# Patient Record
Sex: Female | Born: 1950 | Smoking: Never smoker
Health system: Southern US, Community
[De-identification: ages and names within clinical notes are randomized; demographics above are authoritative.]

## PROBLEM LIST (undated history)

## (undated) DIAGNOSIS — D649 Anemia, unspecified: Secondary | ICD-10-CM

## (undated) DIAGNOSIS — K219 Gastro-esophageal reflux disease without esophagitis: Secondary | ICD-10-CM

## (undated) DIAGNOSIS — M1990 Unspecified osteoarthritis, unspecified site: Secondary | ICD-10-CM

## (undated) DIAGNOSIS — R011 Cardiac murmur, unspecified: Secondary | ICD-10-CM

## (undated) DIAGNOSIS — H269 Unspecified cataract: Secondary | ICD-10-CM

## (undated) DIAGNOSIS — G4733 Obstructive sleep apnea (adult) (pediatric): Secondary | ICD-10-CM

## (undated) DIAGNOSIS — T7840XA Allergy, unspecified, initial encounter: Secondary | ICD-10-CM

## (undated) DIAGNOSIS — C4492 Squamous cell carcinoma of skin, unspecified: Secondary | ICD-10-CM

## (undated) DIAGNOSIS — F419 Anxiety disorder, unspecified: Secondary | ICD-10-CM

## (undated) DIAGNOSIS — K2970 Gastritis, unspecified, without bleeding: Secondary | ICD-10-CM

## (undated) DIAGNOSIS — G4730 Sleep apnea, unspecified: Secondary | ICD-10-CM

## (undated) DIAGNOSIS — I10 Essential (primary) hypertension: Secondary | ICD-10-CM

## (undated) DIAGNOSIS — E0590 Thyrotoxicosis, unspecified without thyrotoxic crisis or storm: Secondary | ICD-10-CM

## (undated) HISTORY — DX: Anemia, unspecified: D64.9

## (undated) HISTORY — DX: Allergy, unspecified, initial encounter: T78.40XA

## (undated) HISTORY — DX: Thyrotoxicosis, unspecified without thyrotoxic crisis or storm: E05.90

## (undated) HISTORY — DX: Unspecified cataract: H26.9

## (undated) HISTORY — DX: Essential (primary) hypertension: I10

## (undated) HISTORY — PX: ABDOMINAL HYSTERECTOMY: SHX81

## (undated) HISTORY — DX: Anxiety disorder, unspecified: F41.9

## (undated) HISTORY — DX: Unspecified osteoarthritis, unspecified site: M19.90

## (undated) HISTORY — DX: Obstructive sleep apnea (adult) (pediatric): G47.33

## (undated) HISTORY — DX: Gastritis, unspecified, without bleeding: K29.70

## (undated) HISTORY — DX: Cardiac murmur, unspecified: R01.1

## (undated) HISTORY — DX: Squamous cell carcinoma of skin, unspecified: C44.92

## (undated) HISTORY — DX: Sleep apnea, unspecified: G47.30

## (undated) HISTORY — PX: EYE SURGERY: SHX253

## (undated) HISTORY — PX: WRIST ARTHROSCOPY: SUR100

## (undated) HISTORY — PX: ENDOMETRIAL ABLATION: SHX621

## (undated) HISTORY — DX: Gastro-esophageal reflux disease without esophagitis: K21.9

## (undated) HISTORY — PX: HERNIA REPAIR: SHX51

---

## 2020-08-27 ENCOUNTER — Telehealth: Payer: Self-pay | Admitting: Internal Medicine

## 2020-09-09 ENCOUNTER — Encounter: Payer: Self-pay | Admitting: Internal Medicine

## 2020-09-09 ENCOUNTER — Other Ambulatory Visit: Payer: Self-pay

## 2020-09-09 ENCOUNTER — Ambulatory Visit (INDEPENDENT_AMBULATORY_CARE_PROVIDER_SITE_OTHER): Payer: Medicare Other | Admitting: Internal Medicine

## 2020-09-09 VITALS — BP 120/80 | HR 69 | Temp 97.9°F | Ht 65.0 in | Wt 171.2 lb

## 2020-09-09 DIAGNOSIS — K2950 Unspecified chronic gastritis without bleeding: Secondary | ICD-10-CM

## 2020-09-09 DIAGNOSIS — H269 Unspecified cataract: Secondary | ICD-10-CM | POA: Insufficient documentation

## 2020-09-09 DIAGNOSIS — Z1231 Encounter for screening mammogram for malignant neoplasm of breast: Secondary | ICD-10-CM | POA: Diagnosis not present

## 2020-09-09 DIAGNOSIS — K219 Gastro-esophageal reflux disease without esophagitis: Secondary | ICD-10-CM

## 2020-09-09 DIAGNOSIS — M8580 Other specified disorders of bone density and structure, unspecified site: Secondary | ICD-10-CM | POA: Diagnosis not present

## 2020-09-09 DIAGNOSIS — Z1283 Encounter for screening for malignant neoplasm of skin: Secondary | ICD-10-CM

## 2020-09-09 DIAGNOSIS — H259 Unspecified age-related cataract: Secondary | ICD-10-CM

## 2020-09-09 DIAGNOSIS — E0590 Thyrotoxicosis, unspecified without thyrotoxic crisis or storm: Secondary | ICD-10-CM

## 2020-09-09 DIAGNOSIS — I10 Essential (primary) hypertension: Secondary | ICD-10-CM | POA: Diagnosis not present

## 2020-09-09 DIAGNOSIS — K2970 Gastritis, unspecified, without bleeding: Secondary | ICD-10-CM | POA: Insufficient documentation

## 2020-09-09 MED ORDER — METHIMAZOLE 5 MG PO TABS: 5.0000 mg | ORAL_TABLET | Freq: Every day | ORAL | 1 refills | Status: DC

## 2020-09-09 MED ORDER — IRBESARTAN 75 MG PO TABS: 75.0000 mg | ORAL_TABLET | Freq: Every day | ORAL | 1 refills | Status: DC

## 2020-09-09 MED ORDER — PANTOPRAZOLE SODIUM 40 MG PO TBEC: 40.0000 mg | DELAYED_RELEASE_TABLET | Freq: Two times a day (BID) | ORAL | 1 refills | Status: DC

## 2020-09-09 NOTE — Progress Notes (Signed)
New Patient Office Visit     This visit occurred during the SARS-CoV-2 public health emergency.  Safety protocols were in place, including screening questions prior to the visit, additional usage of staff PPE, and extensive cleaning of exam room while observing appropriate contact time as indicated for disinfecting solutions.    CC/Reason for Visit: Establish care, discuss chronic conditions, medication refills Previous PCP: in [place]  Last Visit: Spring 2020  HPI: Emlyn Maves is a 69 y.o. female who is coming in today for the above mentioned reasons. Just moved from [place] to be closer to family. Past Medical History is significant for: hyperthyroidism on methimazole, HTN well controlled on irbesartan, GERD and bilateral cataracts. Cataracts have been getting worse, wants to see ophtho. 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 Used to be a redhead, wants to see derm once a year for screening. Her GERD has been much worse lately. She tells me she has had gastritis. Used to have EGD for "surveillance" ([facility]). No records yet. She has started OTC omeprazole in the am and famotidine in the pm. No meaningful relief. She also has some "liver cysts" and was getting an [place] yearly to monitor. She would like her thyroid levels checked.    Past Medical/Surgical History: Past Medical History:  Diagnosis Date  . Cataracts, bilateral   . Gastritis   . GERD (gastroesophageal reflux disease)   . Hypertension   . Hyperthyroidism     History reviewed. No pertinent surgical history.  Social History:  reports that she has never smoked. She has never used smokeless tobacco. She reports current alcohol use. She reports that she does not use drugs.  Allergies: Allergies  Allergen Reactions  . Erythromycin   . Macrobid [Nitrofurantoin]   . Penicillins   . Sulfa Antibiotics     Family History:  No CAD, CVA, cancer that she is aware of.  Current Outpatient Medications:  .  irbesartan  (AVAPRO) 75 MG tablet, Take 75 mg by mouth daily., Disp: , Rfl:  .  methimazole (TAPAZOLE) 5 MG tablet, Take 5 mg by mouth daily., Disp: , Rfl:  .  pantoprazole (PROTONIX) 40 MG tablet, Take 1 tablet (40 mg total) by mouth 2 (two) times daily., Disp: 180 tablet, Rfl: 1 .  RESTASIS 0.05 % ophthalmic emulsion, 1 drop 2 (two) times daily., Disp: , Rfl:   Review of Systems:  Constitutional: Denies fever, chills, diaphoresis, appetite change and fatigue.  HEENT: Denies photophobia, eye pain, redness, hearing loss, ear pain, congestion, sore throat, rhinorrhea, sneezing, mouth sores, trouble swallowing, neck pain, neck stiffness and tinnitus.   Respiratory: Denies SOB, DOE, cough, chest tightness,  and wheezing.   Cardiovascular: Denies chest pain, palpitations and leg swelling.  Gastrointestinal: Denies nausea, vomiting,  diarrhea, constipation, blood in stool and abdominal distention.  Genitourinary: Denies dysuria, urgency, frequency, hematuria, flank pain and difficulty urinating.  Endocrine: Denies: hot or cold intolerance, sweats, changes in hair or nails, polyuria, polydipsia. Musculoskeletal: Denies myalgias, back pain, joint swelling, arthralgias and gait problem.  Skin: Denies pallor, rash and wound.  Neurological: Denies dizziness, seizures, syncope, weakness, light-headedness, numbness and headaches.  Hematological: Denies adenopathy. Easy bruising, personal or family bleeding history  Psychiatric/Behavioral: Denies suicidal ideation, mood changes, confusion, nervousness, sleep disturbance and agitation    Physical Exam: Vitals:   09/09/20 1545  BP: 120/80  Pulse: 69  Temp: 97.9 F (36.6 C)  TempSrc: Oral  SpO2: 97%  Weight: 171 lb 3.2 oz (77.7 kg)  Height: 5\' 5"  (1.651 m)   Body mass index is 28.49 kg/m.  Constitutional: NAD, calm, comfortable Eyes: PERRL, lids and conjunctivae normal, wears corrective lenses. ENMT: Mucous membranes are moist Respiratory: clear to  auscultation bilaterally, no wheezing, no crackles. Normal respiratory effort. No accessory muscle use.  Cardiovascular: Regular rate and rhythm, systolic murmurs, no rubs / gallops. No extremity edema. 2+ pedal pulses. No carotid bruits.  Neurologic: grossly intact and non-focal  Psychiatric: Normal judgment and insight. Alert and oriented x 3. Normal mood.    Impression and Plan:  Encounter for screening mammogram for malignant neoplasm of breast  - Plan: MM Digital Screening  Osteopenia, unspecified location  - Plan: DG Bone Density  Hyperthyroidism  - Plan: T3, free, T4, free, TSH -Refill methimazole 5 mg daily. -Refer to endocrinology.  Primary hypertension -Well controlled on irbesartan 75 mg daily. Refill today.  Gastroesophageal reflux disease without esophagitis Chronic gastritis without bleeding, unspecified gastritis type  - Plan: pantoprazole (PROTONIX) 40 MG tablet BID -Refer to GI, looks like may need EGD. Obtain records to see why she was getting surveillance EGDs.  Age-related cataract of both eyes, unspecified age-related cataract type -ophtho referral.    Patient Instructions  -Nice seeing you today!!  -Lab work today; will notify you once results are available.  -Referrals to GI, Endocrinology, Ophthalmology and Dermatology today.  -Start Protonix 40 mg twice daily.  -Keep your January appointment for your physical. Please come in fasting that day.     Lelon Frohlich, MD [facility] at [facility]

## 2020-09-09 NOTE — Patient Instructions (Signed)
-  Nice seeing you today!!  -Lab work today; will notify you once results are available.  -Referrals to GI, Endocrinology, Ophthalmology and Dermatology today.  -Start Protonix 40 mg twice daily.  -Keep your January appointment for your physical. Please come in fasting that day.

## 2020-09-10 LAB — T4, FREE: Free T4: 1.2 ng/dL (ref 0.8–1.8)

## 2020-09-10 LAB — T3, FREE: T3, Free: 3.1 pg/mL (ref 2.3–4.2)

## 2020-09-10 LAB — TSH: TSH: 4.36 mIU/L (ref 0.40–4.50)

## 2020-09-12 ENCOUNTER — Telehealth: Payer: Self-pay | Admitting: Internal Medicine

## 2020-09-12 DIAGNOSIS — I10 Essential (primary) hypertension: Secondary | ICD-10-CM

## 2020-09-12 DIAGNOSIS — E0590 Thyrotoxicosis, unspecified without thyrotoxic crisis or storm: Secondary | ICD-10-CM

## 2020-09-12 DIAGNOSIS — K219 Gastro-esophageal reflux disease without esophagitis: Secondary | ICD-10-CM

## 2020-09-12 DIAGNOSIS — K2950 Unspecified chronic gastritis without bleeding: Secondary | ICD-10-CM

## 2020-09-12 MED ORDER — METHIMAZOLE 5 MG PO TABS: 5.0000 mg | ORAL_TABLET | Freq: Every day | ORAL | 1 refills | Status: DC

## 2020-09-12 MED ORDER — PANTOPRAZOLE SODIUM 40 MG PO TBEC: 40.0000 mg | DELAYED_RELEASE_TABLET | Freq: Two times a day (BID) | ORAL | 1 refills | Status: DC

## 2020-09-12 MED ORDER — IRBESARTAN 75 MG PO TABS: 75.0000 mg | ORAL_TABLET | Freq: Every day | ORAL | 1 refills | Status: DC

## 2020-09-12 NOTE — Telephone Encounter (Signed)
irbesartan (AVAPRO) 75 MG tablet  methimazole (TAPAZOLE) 5 MG tablet  pantoprazole (PROTONIX) 40 MG tablet  [id number] [address] [place], [place] - [address] AT [address] Phone:  [phone]  Fax:  [phone]

## 2020-09-12 NOTE — Addendum Note (Signed)
Addended by: Westley Hummer B on: 09/12/2020 04:46 PM   Modules accepted: Orders

## 2020-09-12 NOTE — Telephone Encounter (Signed)
Refills sent

## 2020-09-30 ENCOUNTER — Other Ambulatory Visit: Payer: Self-pay

## 2020-09-30 ENCOUNTER — Ambulatory Visit (INDEPENDENT_AMBULATORY_CARE_PROVIDER_SITE_OTHER)
Admission: RE | Admit: 2020-09-30 | Discharge: 2020-09-30 | Disposition: A | Discharge status: Home / Self Care | Payer: Medicare Other | Source: Ambulatory Visit | Attending: Internal Medicine | Admitting: Internal Medicine

## 2020-09-30 ENCOUNTER — Encounter: Payer: Self-pay | Admitting: Internal Medicine

## 2020-09-30 DIAGNOSIS — M8580 Other specified disorders of bone density and structure, unspecified site: Secondary | ICD-10-CM | POA: Diagnosis not present

## 2020-09-30 DIAGNOSIS — M810 Age-related osteoporosis without current pathological fracture: Secondary | ICD-10-CM | POA: Insufficient documentation

## 2020-10-03 ENCOUNTER — Other Ambulatory Visit: Payer: Self-pay

## 2020-10-03 ENCOUNTER — Ambulatory Visit (INDEPENDENT_AMBULATORY_CARE_PROVIDER_SITE_OTHER): Payer: Medicare Other | Admitting: Internal Medicine

## 2020-10-03 VITALS — BP 134/82 | HR 75 | Temp 98.5°F | Ht 65.0 in | Wt 173.8 lb

## 2020-10-03 DIAGNOSIS — M810 Age-related osteoporosis without current pathological fracture: Secondary | ICD-10-CM

## 2020-10-03 NOTE — Progress Notes (Signed)
Established Patient Office Visit     This visit occurred during the SARS-CoV-2 public health emergency.  Safety protocols were in place, including screening questions prior to the visit, additional usage of staff PPE, and extensive cleaning of exam room while observing appropriate contact time as indicated for disinfecting solutions.    CC/Reason for Visit: Discuss osteoporosis management  HPI: Bethany Cole is a 69 y.o. female who is coming in today for the above mentioned reasons.  We have asked her to come in today to discuss her DEXA scan results.  It was done on December 7 and showed osteoporosis with a left femoral neck T score of -2.6.  Right femoral neck was -2.2 and lumbar spine was -1.8.  She has had 2 fractures in the past.   Past Medical/Surgical History: Past Medical History:  Diagnosis Date  . Cataracts, bilateral   . Gastritis   . GERD (gastroesophageal reflux disease)   . Hypertension   . Hyperthyroidism     No past surgical history on file.  Social History:  reports that she has never smoked. She has never used smokeless tobacco. She reports current alcohol use. She reports that she does not use drugs.  Allergies: Allergies  Allergen Reactions  . Erythromycin   . Macrobid [Nitrofurantoin]   . Penicillins   . Sulfa Antibiotics     Family History:  No history of heart disease, cancer, stroke that she is aware of  Current Outpatient Medications:  .  irbesartan (AVAPRO) 75 MG tablet, Take 1 tablet (75 mg total) by mouth daily., Disp: 90 tablet, Rfl: 1 .  methimazole (TAPAZOLE) 5 MG tablet, Take 1 tablet (5 mg total) by mouth daily., Disp: 90 tablet, Rfl: 1 .  pantoprazole (PROTONIX) 40 MG tablet, Take 1 tablet (40 mg total) by mouth 2 (two) times daily., Disp: 180 tablet, Rfl: 1 .  RESTASIS 0.05 % ophthalmic emulsion, 1 drop 2 (two) times daily., Disp: , Rfl:   Review of Systems:  Constitutional: Denies fever, chills, diaphoresis, appetite change  and fatigue.  HEENT: Denies photophobia, eye pain, redness, hearing loss, ear pain, congestion, sore throat, rhinorrhea, sneezing, mouth sores, trouble swallowing, neck pain, neck stiffness and tinnitus.   Respiratory: Denies SOB, DOE, cough, chest tightness,  and wheezing.   Cardiovascular: Denies chest pain, palpitations and leg swelling.  Gastrointestinal: Denies nausea, vomiting, abdominal pain, diarrhea, constipation, blood in stool and abdominal distention.  Genitourinary: Denies dysuria, urgency, frequency, hematuria, flank pain and difficulty urinating.  Endocrine: Denies: hot or cold intolerance, sweats, changes in hair or nails, polyuria, polydipsia. Musculoskeletal: Denies myalgias, back pain, joint swelling, arthralgias and gait problem.  Skin: Denies pallor, rash and wound.  Neurological: Denies dizziness, seizures, syncope, weakness, light-headedness, numbness and headaches.  Hematological: Denies adenopathy. Easy bruising, personal or family bleeding history  Psychiatric/Behavioral: Denies suicidal ideation, mood changes, confusion, nervousness, sleep disturbance and agitation    Physical Exam: Vitals:   10/03/20 1407  BP: 134/82  Pulse: 75  Temp: 98.5 F (36.9 C)  TempSrc: Oral  SpO2: 96%  Weight: 173 lb 12.8 oz (78.8 kg)  Height: 5\' 5"  (1.651 m)    Body mass index is 28.92 kg/m.   Constitutional: NAD, calm, comfortable Eyes: PERRL, lids and conjunctivae normal ENMT: Mucous membranes are moist. Neurologic: Grossly intact and nonfocal Psychiatric: Normal judgment and insight. Alert and oriented x 3. Normal mood.    Impression and Plan:  Age-related osteoporosis without current pathological fracture -We have discussed osteoporosis management. -  We have discussed mainly bisphosphonate usage.  Side effect profile has been described in detail.  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  She  has also not had a dental appointment in over a year. -I would like her to secure dental care before we start a bisphosphonate. -She will do this and notify me afterwards to consider bisphosphonate initiation.    Lelon Frohlich, MD [facility]

## 2020-10-20 ENCOUNTER — Encounter: Payer: Self-pay | Admitting: Nurse Practitioner

## 2020-10-31 DIAGNOSIS — H35371 Puckering of macula, right eye: Secondary | ICD-10-CM | POA: Diagnosis not present

## 2020-10-31 DIAGNOSIS — H2513 Age-related nuclear cataract, bilateral: Secondary | ICD-10-CM | POA: Diagnosis not present

## 2020-10-31 DIAGNOSIS — H524 Presbyopia: Secondary | ICD-10-CM | POA: Diagnosis not present

## 2020-10-31 DIAGNOSIS — H5211 Myopia, right eye: Secondary | ICD-10-CM | POA: Diagnosis not present

## 2020-11-06 ENCOUNTER — Encounter: Payer: Self-pay | Admitting: Internal Medicine

## 2020-11-06 ENCOUNTER — Ambulatory Visit (INDEPENDENT_AMBULATORY_CARE_PROVIDER_SITE_OTHER): Payer: Medicare Other | Admitting: Internal Medicine

## 2020-11-06 ENCOUNTER — Other Ambulatory Visit: Payer: Self-pay

## 2020-11-06 VITALS — BP 130/80 | HR 64 | Temp 98.2°F | Ht 63.0 in | Wt 172.9 lb

## 2020-11-06 DIAGNOSIS — Z9989 Dependence on other enabling machines and devices: Secondary | ICD-10-CM | POA: Diagnosis not present

## 2020-11-06 DIAGNOSIS — I10 Essential (primary) hypertension: Secondary | ICD-10-CM

## 2020-11-06 DIAGNOSIS — G4733 Obstructive sleep apnea (adult) (pediatric): Secondary | ICD-10-CM | POA: Diagnosis not present

## 2020-11-06 DIAGNOSIS — K219 Gastro-esophageal reflux disease without esophagitis: Secondary | ICD-10-CM

## 2020-11-06 DIAGNOSIS — Z0000 Encounter for general adult medical examination without abnormal findings: Secondary | ICD-10-CM | POA: Diagnosis not present

## 2020-11-06 DIAGNOSIS — M810 Age-related osteoporosis without current pathological fracture: Secondary | ICD-10-CM | POA: Diagnosis not present

## 2020-11-06 DIAGNOSIS — E0590 Thyrotoxicosis, unspecified without thyrotoxic crisis or storm: Secondary | ICD-10-CM

## 2020-11-06 DIAGNOSIS — Z1231 Encounter for screening mammogram for malignant neoplasm of breast: Secondary | ICD-10-CM | POA: Diagnosis not present

## 2020-11-06 DIAGNOSIS — H9190 Unspecified hearing loss, unspecified ear: Secondary | ICD-10-CM

## 2020-11-06 NOTE — Patient Instructions (Addendum)
-Nice seeing you today!!  -Lab work today; will notify you once results are available.  -Remember your tetanus and shingles vaccines at the pharmacy,  -Start fosamax 70 mg weekly.  -Schedule follow up in 1 year or sooner as needed.   Preventive Care 71 Years and Older, Female Preventive care refers to lifestyle choices and visits with your health care provider that can promote health and wellness. This includes:  A yearly physical exam. This is also called an annual wellness visit.  Regular dental and eye exams.  Immunizations.  Screening for certain conditions.  Healthy lifestyle choices, such as: ? Eating a healthy diet. ? Getting regular exercise. ? Not using drugs or products that contain nicotine and tobacco. ? Limiting alcohol use. What can I expect for my preventive care visit? Physical exam Your health care provider will check your:  Height and weight. These may be used to calculate your BMI (body mass index). BMI is a measurement that tells if you are at a healthy weight.  Heart rate and blood pressure.  Body temperature.  Skin for abnormal spots. Counseling Your health care provider may ask you questions about your:  Past medical problems.  Family's medical history.  Alcohol, tobacco, and drug use.  Emotional well-being.  Home life and relationship well-being.  Sexual activity.  Diet, exercise, and sleep habits.  History of falls.  Memory and ability to understand (cognition).  Work and work Statistician.  Pregnancy and menstrual history.  Access to firearms. What immunizations do I need? Vaccines are usually given at various ages, according to a schedule. Your health care provider will recommend vaccines for you based on your age, medical history, and lifestyle or other factors, such as travel or where you work.   What tests do I need? Blood tests  Lipid and cholesterol levels. These may be checked every 5 years, or more often depending  on your overall health.  Hepatitis C test.  Hepatitis B test. Screening  Lung cancer screening. You may have this screening every year starting at age 71 if you have a 30-pack-year history of smoking and currently smoke or have quit within the past 15 years.  Colorectal cancer screening. ? All adults should have this screening starting at age 71 and continuing until age 52. ? Your health care provider may recommend screening at age 71 if you are at increased risk. ? You will have tests every 1-10 years, depending on your results and the type of screening test.  Diabetes screening. ? This is done by checking your blood sugar (glucose) after you have not eaten for a while (fasting). ? You may have this done every 1-3 years.  Mammogram. ? This may be done every 1-2 years. ? Talk with your health care provider about how often you should have regular mammograms.  Abdominal aortic aneurysm (AAA) screening. You may need this if you are a current or former smoker.  BRCA-related cancer screening. This may be done if you have a family history of breast, ovarian, tubal, or peritoneal cancers. Other tests  STD (sexually transmitted disease) testing, if you are at risk.  Bone density scan. This is done to screen for osteoporosis. You may have this done starting at age 71. Talk with your health care provider about your test results, treatment options, and if necessary, the need for more tests. Follow these instructions at home: Eating and drinking  Eat a diet that includes fresh fruits and vegetables, whole grains, lean protein, and low-fat dairy  products. Limit your intake of foods with high amounts of sugar, saturated fats, and salt.  Take vitamin and mineral supplements as recommended by your health care provider.  Do not drink alcohol if your health care provider tells you not to drink.  If you drink alcohol: ? Limit how much you have to 0-1 drink a day. ? Be aware of how much alcohol  is in your drink. In the [place] one drink equals one 12 oz bottle of beer (355 mL), one 5 oz glass of wine (148 mL), or one 1 oz glass of hard liquor (44 mL).   Lifestyle  Take daily care of your teeth and gums. Brush your teeth every morning and night with fluoride toothpaste. Floss one time each day.  Stay active. Exercise for at least 30 minutes 5 or more days each week.  Do not use any products that contain nicotine or tobacco, such as cigarettes, e-cigarettes, and chewing tobacco. If you need help quitting, ask your health care provider.  Do not use drugs.  If you are sexually active, practice safe sex. Use a condom or other form of protection in order to prevent STIs (sexually transmitted infections).  Talk with your health care provider about taking a low-dose aspirin or statin.  Find healthy ways to cope with stress, such as: ? Meditation, yoga, or listening to music. ? Journaling. ? Talking to a trusted person. ? Spending time with friends and family. Safety  Always wear your seat belt while driving or riding in a vehicle.  Do not drive: ? If you have been drinking alcohol. Do not ride with someone who has been drinking. ? When you are tired or distracted. ? While texting.  Wear a helmet and other protective equipment during sports activities.  If you have firearms in your house, make sure you follow all gun safety procedures. What's next?  Visit your health care provider once a year for an annual wellness visit.  Ask your health care provider how often you should have your eyes and teeth checked.  Stay up to date on all vaccines. This information is not intended to replace advice given to you by your health care provider. Make sure you discuss any questions you have with your health care provider. Document Revised: 10/01/2020 Document Reviewed: 10/05/2018 Elsevier Patient Education  2021 [employer].

## 2020-11-06 NOTE — Progress Notes (Signed)
Established Patient Office Visit     This visit occurred during the SARS-CoV-2 public health emergency.  Safety protocols were in place, including screening questions prior to the visit, additional usage of staff PPE, and extensive cleaning of exam room while observing appropriate contact time as indicated for disinfecting solutions.    CC/Reason for Visit: Annual preventive exam and subsequent Medicare wellness visit   HPI: Bethany Cole is a 70 y.o. female who is coming in today for the above mentioned reasons. Past Medical History is significant for: Hyperthyroidism on methimazole, hypertension, GERD, cataracts, struct of sleep apnea on CPAP.  She was recently diagnosed with osteoporosis after screening DEXA scan.  And was advised she start Fosamax but she wanted to check with her dentist first.  She is requesting bisphosphonate prescription today.  She is requesting referral to an audiologist that she is having some issues with her hearing aids, she requests referral to sports medicine she has some old orthotics that she needs to be done.  She also would like referral to sleep medicine to update her CPAP supplies.  She is due for Tdap and shingles vaccines.  She is fully vaccinated against COVID and influenza.  She has no acute complaints today.  She has routine eye and dental care.   Past Medical/Surgical History: Past Medical History:  Diagnosis Date  . Cataracts, bilateral   . Gastritis   . GERD (gastroesophageal reflux disease)   . Hypertension   . Hyperthyroidism     No past surgical history on file.  Social History:  reports that she has never smoked. She has never used smokeless tobacco. She reports current alcohol use. She reports that she does not use drugs.  Allergies: Allergies  Allergen Reactions  . Erythromycin   . Macrobid [Nitrofurantoin]   . Penicillins   . Sulfa Antibiotics     Family History:  No history of heart disease, cancer, stroke that she is  aware of  Current Outpatient Medications:  .  irbesartan (AVAPRO) 75 MG tablet, Take 1 tablet (75 mg total) by mouth daily., Disp: 90 tablet, Rfl: 1 .  methimazole (TAPAZOLE) 5 MG tablet, Take 1 tablet (5 mg total) by mouth daily., Disp: 90 tablet, Rfl: 1 .  pantoprazole (PROTONIX) 40 MG tablet, Take 1 tablet (40 mg total) by mouth 2 (two) times daily., Disp: 180 tablet, Rfl: 1 .  RESTASIS 0.05 % ophthalmic emulsion, 1 drop 2 (two) times daily., Disp: , Rfl:   Review of Systems:  Constitutional: Denies fever, chills, diaphoresis, appetite change and fatigue.  HEENT: Denies photophobia, eye pain, redness, hearing loss, ear pain, congestion, sore throat, rhinorrhea, sneezing, mouth sores, trouble swallowing, neck pain, neck stiffness and tinnitus.   Respiratory: Denies SOB, DOE, cough, chest tightness,  and wheezing.   Cardiovascular: Denies chest pain, palpitations and leg swelling.  Gastrointestinal: Denies nausea, vomiting, abdominal pain, diarrhea, constipation, blood in stool and abdominal distention.  Genitourinary: Denies dysuria, urgency, frequency, hematuria, flank pain and difficulty urinating.  Endocrine: Denies: hot or cold intolerance, sweats, changes in hair or nails, polyuria, polydipsia. Musculoskeletal: Denies myalgias, back pain, joint swelling, arthralgias and gait problem.  Skin: Denies pallor, rash and wound.  Neurological: Denies dizziness, seizures, syncope, weakness, light-headedness, numbness and headaches.  Hematological: Denies adenopathy. Easy bruising, personal or family bleeding history  Psychiatric/Behavioral: Denies suicidal ideation, mood changes, confusion, nervousness, sleep disturbance and agitation    Physical Exam: Vitals:   11/06/20 1017  BP: 130/80  Pulse: 64  Temp: 98.2 F (36.8 C)  TempSrc: Oral  SpO2: 99%  Weight: 172 lb 14.4 oz (78.4 kg)  Height: $Remove'5\' 3"'IrUxOLx$  (1.6 m)    Body mass index is 30.63 kg/m.   Constitutional: NAD, calm,  comfortable Eyes: PERRL, lids and conjunctivae normal, wears corrective lenses ENMT: Mucous membranes are moist. Posterior pharynx clear of any exudate or lesions. Normal dentition. Tympanic membrane is pearly white, no erythema or bulging. Neck: normal, supple, no masses, no thyromegaly Respiratory: clear to auscultation bilaterally, no wheezing, no crackles. Normal respiratory effort. No accessory muscle use.  Cardiovascular: Regular rate and rhythm, no murmurs / rubs / gallops. No extremity edema. 2+ pedal pulses.  Abdomen: no tenderness, no masses palpated. No hepatosplenomegaly. Bowel sounds positive.  Musculoskeletal: no clubbing / cyanosis. No joint deformity upper and lower extremities. Good ROM, no contractures. Normal muscle tone.  Skin: no rashes, lesions, ulcers. No induration Neurologic: CN 2-12 grossly intact. Sensation intact, DTR normal. Strength 5/5 in all 4.  Psychiatric: Normal judgment and insight. Alert and oriented x 3. Normal mood.    Subsequent Medicare wellness visit   1. Risk factors, based on past  M,S,F -cardiovascular disease risk factors include age, hypertension   2.  Physical activities: She needs to exercise more, she has a new gym membership but is hesitant on going due to the peak in coronavirus cases.   3.  Depression/mood:  Stable, not depressed   4.  Hearing:  No perceived issues, wears hearing aids   5.  ADL's: Independent in all ADLs   6.  Fall risk:  Low fall risk   7.  Home safety: No problems identified   8.  Height weight, and visual acuity: height and weight as above, vision:   Visual Acuity Screening   Right eye Left eye Both eyes  Without correction:     With correction: 20/20 na 20/20     9.  Counseling:  Advise she increase her physical activity, recommended that she get her Tdap and shingles vaccines at pharmacy   10. Lab orders based on risk factors: Laboratory update will be reviewed   11. Referral :  Cardiology, sleep  medicine, sports medicine for orthotics   12. Care plan:  Follow-up with me in 6 months   13. Cognitive assessment:  No cognitive impairment   14. Screening: Patient provided with a written and personalized 5-10 year screening schedule in the AVS.   yes   15. Provider List Update:   PCP only  16. Advance Directives: Full code   [place] Office Visit from 11/06/2020 in [place] at [facility]  PHQ-9 Total Score 0      Fall Risk  11/06/2020 09/09/2020  Falls in the past year? 0 0  Number falls in past yr: 0 0  Injury with Fall? 0 0     Impression and Plan:  Encounter for preventive health examination  -She has routine eye and dental care. -She is due for Tdap and shingles vaccines which she will obtain at her pharmacy, otherwise immunizations are up-to-date including COVID x3. -Screening labs today. -Healthy lifestyle discussed in detail. -She had a colonoscopy in 2019, she is overdue for mammogram, I will order. -She had a recent DEXA scan in December 2021. -She elects to no longer pursue Pap smears.  She states that she had a hysterectomy years ago and was told that she no longer needed cervical cancer screening.  Age-related osteoporosis without current pathological fracture -Start Fosamax 70 mg daily  Hyperthyroidism  - Plan: TSH -She is on methimazole.  Primary hypertension -Well-controlled on irbesartan.  Gastroesophageal reflux disease without esophagitis -On daily Protonix, referral to GI is in process.  OSA on CPAP -Referral to sleep medicine today.   Patient Instructions   -Nice seeing you today!!  -Lab work today; will notify you once results are available.  -Remember your tetanus and shingles vaccines at the pharmacy,  -Start fosamax 70 mg weekly.  -Schedule follow up in 1 year or sooner as needed.   Preventive Care 14 Years and Older, Female Preventive care refers to lifestyle choices and visits with your health care provider  that can promote health and wellness. This includes:  A yearly physical exam. This is also called an annual wellness visit.  Regular dental and eye exams.  Immunizations.  Screening for certain conditions.  Healthy lifestyle choices, such as: ? Eating a healthy diet. ? Getting regular exercise. ? Not using drugs or products that contain nicotine and tobacco. ? Limiting alcohol use. What can I expect for my preventive care visit? Physical exam Your health care provider will check your:  Height and weight. These may be used to calculate your BMI (body mass index). BMI is a measurement that tells if you are at a healthy weight.  Heart rate and blood pressure.  Body temperature.  Skin for abnormal spots. Counseling Your health care provider may ask you questions about your:  Past medical problems.  Family's medical history.  Alcohol, tobacco, and drug use.  Emotional well-being.  Home life and relationship well-being.  Sexual activity.  Diet, exercise, and sleep habits.  History of falls.  Memory and ability to understand (cognition).  Work and work Statistician.  Pregnancy and menstrual history.  Access to firearms. What immunizations do I need? Vaccines are usually given at various ages, according to a schedule. Your health care provider will recommend vaccines for you based on your age, medical history, and lifestyle or other factors, such as travel or where you work.   What tests do I need? Blood tests  Lipid and cholesterol levels. These may be checked every 5 years, or more often depending on your overall health.  Hepatitis C test.  Hepatitis B test. Screening  Lung cancer screening. You may have this screening every year starting at age 45 if you have a 30-pack-year history of smoking and currently smoke or have quit within the past 15 years.  Colorectal cancer screening. ? All adults should have this screening starting at age 3 and continuing  until age 20. ? Your health care provider may recommend screening at age 64 if you are at increased risk. ? You will have tests every 1-10 years, depending on your results and the type of screening test.  Diabetes screening. ? This is done by checking your blood sugar (glucose) after you have not eaten for a while (fasting). ? You may have this done every 1-3 years.  Mammogram. ? This may be done every 1-2 years. ? Talk with your health care provider about how often you should have regular mammograms.  Abdominal aortic aneurysm (AAA) screening. You may need this if you are a current or former smoker.  BRCA-related cancer screening. This may be done if you have a family history of breast, ovarian, tubal, or peritoneal cancers. Other tests  STD (sexually transmitted disease) testing, if you are at risk.  Bone density scan. This is done to screen for osteoporosis. You may have this done starting at age  66. Talk with your health care provider about your test results, treatment options, and if necessary, the need for more tests. Follow these instructions at home: Eating and drinking  Eat a diet that includes fresh fruits and vegetables, whole grains, lean protein, and low-fat dairy products. Limit your intake of foods with high amounts of sugar, saturated fats, and salt.  Take vitamin and mineral supplements as recommended by your health care provider.  Do not drink alcohol if your health care provider tells you not to drink.  If you drink alcohol: ? Limit how much you have to 0-1 drink a day. ? Be aware of how much alcohol is in your drink. In the [place] one drink equals one 12 oz bottle of beer (355 mL), one 5 oz glass of wine (148 mL), or one 1 oz glass of hard liquor (44 mL).   Lifestyle  Take daily care of your teeth and gums. Brush your teeth every morning and night with fluoride toothpaste. Floss one time each day.  Stay active. Exercise for at least 30 minutes 5 or more days  each week.  Do not use any products that contain nicotine or tobacco, such as cigarettes, e-cigarettes, and chewing tobacco. If you need help quitting, ask your health care provider.  Do not use drugs.  If you are sexually active, practice safe sex. Use a condom or other form of protection in order to prevent STIs (sexually transmitted infections).  Talk with your health care provider about taking a low-dose aspirin or statin.  Find healthy ways to cope with stress, such as: ? Meditation, yoga, or listening to music. ? Journaling. ? Talking to a trusted person. ? Spending time with friends and family. Safety  Always wear your seat belt while driving or riding in a vehicle.  Do not drive: ? If you have been drinking alcohol. Do not ride with someone who has been drinking. ? When you are tired or distracted. ? While texting.  Wear a helmet and other protective equipment during sports activities.  If you have firearms in your house, make sure you follow all gun safety procedures. What's next?  Visit your health care provider once a year for an annual wellness visit.  Ask your health care provider how often you should have your eyes and teeth checked.  Stay up to date on all vaccines. This information is not intended to replace advice given to you by your health care provider. Make sure you discuss any questions you have with your health care provider. Document Revised: 10/01/2020 Document Reviewed: 10/05/2018 Elsevier Patient Education  2021 [place], MD [facility]

## 2020-11-07 ENCOUNTER — Other Ambulatory Visit (INDEPENDENT_AMBULATORY_CARE_PROVIDER_SITE_OTHER): Payer: Medicare Other

## 2020-11-07 ENCOUNTER — Other Ambulatory Visit: Payer: Self-pay | Admitting: Internal Medicine

## 2020-11-07 DIAGNOSIS — Z0000 Encounter for general adult medical examination without abnormal findings: Secondary | ICD-10-CM | POA: Diagnosis not present

## 2020-11-07 DIAGNOSIS — E0590 Thyrotoxicosis, unspecified without thyrotoxic crisis or storm: Secondary | ICD-10-CM

## 2020-11-07 DIAGNOSIS — I10 Essential (primary) hypertension: Secondary | ICD-10-CM | POA: Diagnosis not present

## 2020-11-07 DIAGNOSIS — E785 Hyperlipidemia, unspecified: Secondary | ICD-10-CM

## 2020-11-07 LAB — COMPREHENSIVE METABOLIC PANEL
ALT: 18 U/L (ref 0–35)
AST: 24 U/L (ref 0–37)
Albumin: 4.4 g/dL (ref 3.5–5.2)
Alkaline Phosphatase: 82 U/L (ref 39–117)
BUN: 15 mg/dL (ref 6–23)
CO2: 29 mEq/L (ref 19–32)
Calcium: 9.5 mg/dL (ref 8.4–10.5)
Chloride: 101 mEq/L (ref 96–112)
Creatinine, Ser: 0.73 mg/dL (ref 0.40–1.20)
GFR: 83 mL/min (ref >60.00)
Glucose, Bld: 90 mg/dL (ref 70–99)
Potassium: 4.3 mEq/L (ref 3.5–5.1)
Sodium: 137 mEq/L (ref 135–145)
Total Bilirubin: 0.5 mg/dL (ref 0.2–1.2)
Total Protein: 6.6 g/dL (ref 6.0–8.3)

## 2020-11-07 LAB — CBC WITH DIFFERENTIAL/PLATELET
Basophils Absolute: 0 10*3/uL (ref 0.0–0.1)
Basophils Relative: 0.6 % (ref 0.0–3.0)
Eosinophils Absolute: 0.1 10*3/uL (ref 0.0–0.7)
Eosinophils Relative: 2.5 % (ref 0.0–5.0)
HCT: 36.5 % (ref 36.0–46.0)
Hemoglobin: 12.1 g/dL (ref 12.0–15.0)
Lymphocytes Relative: 21.9 % (ref 12.0–46.0)
Lymphs Abs: 1.1 10*3/uL (ref 0.7–4.0)
MCHC: 33.1 g/dL (ref 30.0–36.0)
MCV: 88 fl (ref 78.0–100.0)
Monocytes Absolute: 0.4 10*3/uL (ref 0.1–1.0)
Monocytes Relative: 8.4 % (ref 3.0–12.0)
Neutro Abs: 3.3 10*3/uL (ref 1.4–7.7)
Neutrophils Relative %: 66.6 % (ref 43.0–77.0)
Platelets: 207 10*3/uL (ref 150.0–400.0)
RBC: 4.15 Mil/uL (ref 3.87–5.11)
RDW: 14.9 % (ref 11.5–15.5)
WBC: 4.9 10*3/uL (ref 4.0–10.5)

## 2020-11-07 LAB — HEMOGLOBIN A1C: Hgb A1c MFr Bld: 5.6 % (ref 4.6–6.5)

## 2020-11-07 LAB — LIPID PANEL
Cholesterol: 214 mg/dL — ABNORMAL HIGH (ref 0–200)
HDL: 80.7 mg/dL (ref >39.00)
LDL Cholesterol: 120 mg/dL — ABNORMAL HIGH (ref 0–99)
NonHDL: 133.15
Total CHOL/HDL Ratio: 3
Triglycerides: 66 mg/dL (ref 0.0–149.0)
VLDL: 13.2 mg/dL (ref 0.0–40.0)

## 2020-11-07 LAB — VITAMIN D 25 HYDROXY (VIT D DEFICIENCY, FRACTURES): VITD: 38.08 ng/mL (ref 30.00–100.00)

## 2020-11-07 LAB — TSH: TSH: 6.3 u[IU]/mL — ABNORMAL HIGH (ref 0.35–4.50)

## 2020-11-07 LAB — VITAMIN B12: Vitamin B-12: 277 pg/mL (ref 211–911)

## 2020-11-07 MED ORDER — ALENDRONATE SODIUM 70 MG PO TABS: 70.0000 mg | ORAL_TABLET | ORAL | 11 refills | Status: DC

## 2020-11-07 MED ORDER — METHIMAZOLE 5 MG PO TABS: 2.5000 mg | ORAL_TABLET | Freq: Every day | ORAL | 1 refills | Status: DC

## 2020-11-10 ENCOUNTER — Ambulatory Visit: Payer: Medicare Other | Admitting: Nurse Practitioner

## 2020-11-17 ENCOUNTER — Ambulatory Visit: Payer: Medicare Other | Admitting: Family Medicine

## 2020-11-17 ENCOUNTER — Other Ambulatory Visit: Payer: Self-pay

## 2020-11-17 VITALS — Ht 63.0 in | Wt 173.0 lb

## 2020-11-17 DIAGNOSIS — M810 Age-related osteoporosis without current pathological fracture: Secondary | ICD-10-CM

## 2020-11-17 DIAGNOSIS — W19XXXA Unspecified fall, initial encounter: Secondary | ICD-10-CM

## 2020-11-17 NOTE — Progress Notes (Signed)
  Bethany Cole - 70 y.o. female MRN [id number]  Date of birth: 09/22/1951  SUBJECTIVE:  Including CC & ROS.  No chief complaint on file.   Bethany Cole is a 70 y.o. female that is presenting with osteoporosis and concern for weakness and falls.  She has a history of previous ankle and wrist fractures.  Has moved from [facility] recently.  She is not as active as she once was.  She wants to be more active and build her strength back up.  She feels unsteady on her feet at times.  She does enjoy walking her dog.   Review of Systems See HPI   HISTORY: Past Medical, Surgical, Social, and Family History Reviewed & Updated per EMR.   Pertinent Historical Findings include:  Past Medical History:  Diagnosis Date  . Cataracts, bilateral   . Gastritis   . GERD (gastroesophageal reflux disease)   . Hypertension   . Hyperthyroidism     No past surgical history on file.  No family history on file.  Social History   Socioeconomic History  . Marital status: Divorced    Spouse name: Not on file  . Number of children: Not on file  . Years of education: Not on file  . Highest education level: Not on file  Occupational History  . Not on file  Tobacco Use  . Smoking status: Never Smoker  . Smokeless tobacco: Never Used  Substance and Sexual Activity  . Alcohol use: Yes    Comment: occasional  . Drug use: Never  . Sexual activity: Not on file  Other Topics Concern  . Not on file  Social History Narrative  . Not on file   Social Determinants of Health   Financial Resource Strain: Not on file  Food Insecurity: Not on file  Transportation Needs: Not on file  Physical Activity: Not on file  Stress: Not on file  Social Connections: Not on file  Intimate Partner Violence: Not on file     PHYSICAL EXAM:  VS: Ht 5\' 3"  (1.6 m)   Wt 173 lb (78.5 kg)   BMI 30.65 kg/m  Physical Exam Gen: NAD, alert, cooperative with exam, well-appearing     ASSESSMENT & PLAN:    Osteoporosis Has a history of multiple pathologic fractures. Most recent bone scan showing osteoporosis.  -Counseled on home exercise therapy and supportive care. -CMP. -Can consider switching from Fosamax to Evenity as she does have GI upset that is exacerbated by the Fosamax.  Fall She feels unsteady at times as well as weakness.  She has had previous falls with fractures as a result of those falls -Counseled on home exercise therapy and supportive care. -Referral to physical therapy.

## 2020-11-17 NOTE — Assessment & Plan Note (Addendum)
Has a history of multiple pathologic fractures. Most recent bone scan showing osteoporosis.  -Counseled on home exercise therapy and supportive care. -CMP. -Can consider switching from Fosamax to Evenity as she does have GI upset that is exacerbated by the Fosamax.

## 2020-11-17 NOTE — Patient Instructions (Signed)
Nice to meet you Physical therapy will give you a call  Please try the exercises  I will call with the results from today   Please send me a message in MyChart with any questions or updates.  We will discuss follow up going forward.   --Dr. Raeford Razor

## 2020-11-17 NOTE — Assessment & Plan Note (Signed)
She feels unsteady at times as well as weakness.  She has had previous falls with fractures as a result of those falls -Counseled on home exercise therapy and supportive care. -Referral to physical therapy.

## 2020-11-18 ENCOUNTER — Telehealth: Payer: Self-pay | Admitting: Family Medicine

## 2020-11-18 LAB — COMPREHENSIVE METABOLIC PANEL
ALT: 18 IU/L (ref 0–32)
AST: 36 IU/L (ref 0–40)
Albumin/Globulin Ratio: 1.8 (ref 1.2–2.2)
Albumin: 4.5 g/dL (ref 3.8–4.8)
Alkaline Phosphatase: 113 IU/L (ref 44–121)
BUN/Creatinine Ratio: 17 (ref 12–28)
BUN: 12 mg/dL (ref 8–27)
Bilirubin Total: <0.2 mg/dL (ref 0.0–1.2)
CO2: 19 mmol/L — ABNORMAL LOW (ref 20–29)
Calcium: 9.6 mg/dL (ref 8.7–10.3)
Chloride: 101 mmol/L (ref 96–106)
Creatinine, Ser: 0.72 mg/dL (ref 0.57–1.00)
GFR calc Af Amer: 98 mL/min/{1.73_m2} (ref >59)
GFR calc non Af Amer: 85 mL/min/{1.73_m2} (ref >59)
Globulin, Total: 2.5 g/dL (ref 1.5–4.5)
Glucose: 84 mg/dL (ref 65–99)
Potassium: 5.2 mmol/L (ref 3.5–5.2)
Sodium: 139 mmol/L (ref 134–144)
Total Protein: 7 g/dL (ref 6.0–8.5)

## 2020-11-18 NOTE — Telephone Encounter (Signed)
Informed of results.   Rosemarie Ax, MD [facility] 11/18/2020, 8:10 AM

## 2020-11-25 ENCOUNTER — Other Ambulatory Visit: Payer: Self-pay

## 2020-11-25 ENCOUNTER — Ambulatory Visit: Payer: Medicare Other | Admitting: Nurse Practitioner

## 2020-11-25 ENCOUNTER — Encounter: Payer: Self-pay | Admitting: Nurse Practitioner

## 2020-11-25 VITALS — BP 132/80 | HR 80 | Ht 63.0 in | Wt 172.0 lb

## 2020-11-25 DIAGNOSIS — K6389 Other specified diseases of intestine: Secondary | ICD-10-CM

## 2020-11-25 DIAGNOSIS — K219 Gastro-esophageal reflux disease without esophagitis: Secondary | ICD-10-CM

## 2020-11-25 DIAGNOSIS — R143 Flatulence: Secondary | ICD-10-CM

## 2020-11-25 DIAGNOSIS — R140 Abdominal distension (gaseous): Secondary | ICD-10-CM | POA: Diagnosis not present

## 2020-11-25 DIAGNOSIS — K638219 Small intestinal bacterial overgrowth, unspecified: Secondary | ICD-10-CM

## 2020-11-25 MED ORDER — RIFAXIMIN 550 MG PO TABS: 550.0000 mg | ORAL_TABLET | Freq: Three times a day (TID) | ORAL | 0 refills | Status: DC

## 2020-11-25 NOTE — Progress Notes (Addendum)
ASSESSMENT AND PLAN    # 70 yo female with GERD / heartburn --Some improvement on BID PPI but still requiring Pepcid 3-4 times a week for breakthrough symptoms.   --Anti-reflux measures discussed. She will go to bed on empty stomach, elevate HOB or get a wedge pillow and work on weight loss.  --Continue BID pantoprazole and take pepcid 20 mg daily as needed for breakthrough symptoms.  --Will get copies of records from GI in [place]. Apparently had an EGD 1 year ago for GERD symptoms --Follow up with me in 3-4 weeks. If not improving with anti-reflux measurses consider repeat EGD and / or 24 hour pH study.   # Bloating / excessive flatus. Hx of SIBO ( breath test). Symptoms improved with course of Xifaxan in 2017 and 2018. Sounds like she has already been evaluated for celiac disease.  --Await GI records. In interim will repeat course of Xifaxan ( hopefully insurance will cover it)  # Hx of colon polyps per patient --Awaiting records   # ? Liver cysts per patient. Sounds like this was incidental finding during cardiac workup for "heartburn" --Await records from [place] in [place]: Received records from Bethany Craze, MD from [facility] in [place] Patient was followed by this practice for history of adenomatous colon polyps, methane SIBO,   IBS with bloating and gas, intestinal metaplasia on gastric biopsy   EGD 01/25/2020 for evaluation of heartburn and chest pain.  The esophagus, stomach and examined duodenum were normal  Screening colonoscopy December 2019.  Complete exam.  Good bowel prep. A 3 mm polyp was removed from the sigmoid colon.  Mild melanosis, biopsied.  Multiple large and small mouth diverticula in the sigmoid colon.:  Path compatible with a tubular adenoma, melanosis coli without pathologic abnormality.  EGD August 2018 for dysphagia and history of gastric body intestinal metaplasia in 2012 in 2016.  A medium nonbleeding diverticulum was found  in the area of the papilla.  The entire stomach was normal.  Biopsies were taken for histology.  The esophagus was normal.  Empiric dilation was done with a savory dilator with mild resistance at 17 mm.  Antral biopsies compatible with chronic gastritis, no H. pylori.  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  Negative for dysplasia.  For methane SIBO patient was treated with Cipro and neomycin in 2019.  She had significant improvement in bloating.  She had recurrent bloating and treated a second time with rifaximin  Bethany Cole     Primary Gastroenterologist  New- Bethany Lund, MD  Chief Complaint : GERD  Bethany Cole is a 70 y.o. female with PMH / Muddy significant for,  but not necessarily limited to: hyperthyroidism, hyperlipidemia, OSA wiith CPAP, fibromyalgia, osteoporosis,  arthritis, hysterectomy  Patient referred by PCP for GERD. She was previously followed by GI in [place] and has been trying to get records for this appointment.     In March 2021 in [place] patient was having heartburn. She was seen at Urgent Care then saw GI. She reports that an EGD was normal. She was diagnosed with "gluten sensitivity" but not celiac.  Heartburn spontaneously resolved.   Patient moved to [facility] in July. Two months ago she developed recurrent heartburn. Having daily symptoms, especially in am prior to eating or drinking anything. Additionally she has been having intermittent nausea.  She sleeps on one pillow.  Patient saw PCP a month or so ago , started  on Pantoprazole BID and has some improvement but has had to add  Famotidine as needed for breakthrough symptoms. She tries to follow the FODMAP diet. She drinks a cup of coffee in am, no other caffeine the remainader of day. She has gained about 8 pounds since moving to [place].   Patient also complains of a several month history of severe bloating and flatulence every day.  Symptoms mainly postprandial but stress can sometimes aggravate the problem. She avoids dairy, sugar, certain vegetables, certain types of fruit. Her BMs are typically normal.  She gives a history of SIBO  In 2017 ( breath test). She was treated with Xifaxan with resolution of bloating.  A year later she had recurrent bloating / gas which resolved with repeat course of Xifaxan  She did well until symptoms starting coming back ~ 4 months ago. She developed recurrent symptoms four months ago.    Data Reviewed:  H.pylori negative in April 2021 per patient Prior Celiac testing negative per patient    Previous Endoscopic Evaluations / Pertinent Studies:   2019 colonoscopy in [place], polyps removed per patient.   2021 Normal EGD in [place] per patient.   Past Medical History:  Diagnosis Date  . Cataracts, bilateral   . Gastritis   . GERD (gastroesophageal reflux disease)   . Hypertension   . Hyperthyroidism      Past Surgical History:  Procedure Laterality Date  . ABDOMINAL HYSTERECTOMY    . ENDOMETRIAL ABLATION    . HERNIA REPAIR    . WRIST ARTHROSCOPY     Family History  Problem Relation Age of Onset  . Ovarian cancer Mother   . Heart disease Father    Social History   Tobacco Use  . Smoking status: Never Smoker  . Smokeless tobacco: Never Used  Substance Use Topics  . Alcohol use: Yes    Comment: occasional  . Drug use: Never   Current Outpatient Medications  Medication Sig Dispense Refill  . alendronate (FOSAMAX) 70 MG tablet Take 1 tablet (70 mg total) by mouth every 7 (seven) days. Take with a full glass of water on an empty stomach. 4 tablet 11  . irbesartan (AVAPRO) 75 MG tablet Take 1 tablet (75 mg total) by mouth daily. 90 tablet 1  . methimazole (TAPAZOLE) 5 MG tablet Take 0.5 tablets (2.5 mg total) by mouth daily. 90 tablet 1  . pantoprazole (PROTONIX) 40 MG tablet Take 1 tablet (40 mg total) by mouth 2 (two) times daily. 180 tablet 1  . RESTASIS 0.05 %  ophthalmic emulsion 1 drop 2 (two) times daily.     No current facility-administered medications for this visit.   Allergies  Allergen Reactions  . Erythromycin   . Macrobid [Nitrofurantoin]   . Penicillins   . Sulfa Antibiotics      Review of Systems: Positive for fatigue.  All other systems reviewed and negative except where noted in HPI.   PHYSICAL EXAM :    Wt Readings from Last 3 Encounters:  11/25/20 172 lb (78 kg)  11/17/20 173 lb (78.5 kg)  11/06/20 172 lb 14.4 oz (78.4 kg)    BP 132/80   Pulse 80   Ht 5\' 3"  (1.6 m)   Wt 172 lb (78 kg)   BMI 30.47 kg/m  Constitutional:  Pleasant female in no acute distress. Psychiatric: Normal mood and affect. Behavior is normal. EENT: Pupils normal.  Conjunctivae are normal. No scleral icterus. Neck supple.  Cardiovascular: Normal rate, regular rhythm. Very  soft murmur. No edema Pulmonary/chest: Effort normal and breath sounds normal. No wheezing, rales or rhonchi. Abdominal: Soft, nondistended, nontender. Bowel sounds active throughout. There are no masses palpable. No hepatomegaly. Neurological: Alert and oriented to person place and time. Skin: Skin is warm and dry. No rashes noted.  Tye Savoy, NP  11/25/2020, 9:35 AM  Cc:  Referring Provider Isaac Bliss, [place]*

## 2020-11-25 NOTE — Patient Instructions (Addendum)
If you are age 71 or older, your body mass index should be between 23-30. Your Body mass index is 30.47 kg/m. If this is out of the aforementioned range listed, please consider follow up with your Primary Care Provider.  We have sent the following medications to your pharmacy for you to pick up at your convenience: Xifaxan   Continue taking Pantoprazole 40mg - twice daily.   Continue taking Pepcid 20mg  - once daily.   We will try to obtain records from previous GI physician in [place] once you have sent mychart message with that information. Release was signed today.   Please see Anti Reflux measures handout.   We will see you back in 3-4 months. Office will call to schedule follow- up appointment.   Thank you for choosing me and [facility].  Tye Savoy NP

## 2020-11-27 NOTE — Progress Notes (Addendum)
____________________________________________________________  Attending physician addendum:  Thank you for sending this case to me. I have reviewed the entire note and agree with the plan.  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  [name], MD  ____________________________________________________________   I reviewed your addendum with the previous GI records.  Of note, she had gastric intestinal metaplasia in 2018, but it is not clear that any strict surveillance biopsies were taken on the follow-up EGD in April 2021.  Given that and her persistent reflux symptoms, she should have an upper endoscopy this year.  Please let me know when you see her next, because we might also need to consider Bravo testing at the same time.  Lastly, if her symptoms are felt to be reminiscent of previous SIBO and there is difficulty getting rifaximin, you could give her a course of metronidazole as an alternative.  That is sometimes given with a first generation cephalosporin, but she has a reported penicillin allergy.  - HD

## 2020-12-02 ENCOUNTER — Other Ambulatory Visit: Payer: Self-pay

## 2020-12-02 ENCOUNTER — Ambulatory Visit: Payer: Medicare Other | Attending: Family Medicine

## 2020-12-02 DIAGNOSIS — R2681 Unsteadiness on feet: Secondary | ICD-10-CM | POA: Diagnosis not present

## 2020-12-02 DIAGNOSIS — G8929 Other chronic pain: Secondary | ICD-10-CM | POA: Insufficient documentation

## 2020-12-02 DIAGNOSIS — M545 Low back pain, unspecified: Secondary | ICD-10-CM | POA: Diagnosis not present

## 2020-12-02 DIAGNOSIS — M6281 Muscle weakness (generalized): Secondary | ICD-10-CM | POA: Diagnosis not present

## 2020-12-02 DIAGNOSIS — R2689 Other abnormalities of gait and mobility: Secondary | ICD-10-CM | POA: Insufficient documentation

## 2020-12-02 DIAGNOSIS — Z9181 History of falling: Secondary | ICD-10-CM

## 2020-12-02 DIAGNOSIS — R293 Abnormal posture: Secondary | ICD-10-CM | POA: Diagnosis not present

## 2020-12-02 DIAGNOSIS — R42 Dizziness and giddiness: Secondary | ICD-10-CM | POA: Insufficient documentation

## 2020-12-02 DIAGNOSIS — M25511 Pain in right shoulder: Secondary | ICD-10-CM | POA: Diagnosis not present

## 2020-12-02 NOTE — Progress Notes (Signed)
Name: Bethany Cole  MRN/ DOB: [id number], 1950/12/19    Age/ Sex: 70 y.o., female    PCP: Isaac Bliss, Rayford Halsted, MD   Reason for Endocrinology Evaluation: Hyperthyroidism      Date of Initial Endocrinology Evaluation: 12/03/2020     HPI: [name] is a 70 y.o. female with a past medical history of HTN, GERD and Hyperthyroidism. The patient presented for initial endocrinology clinic visit on 12/03/2020 for consultative assistance with her Hyperthyroidism .     Moved from [place] 04/2020   She has been diagnosed with hyperthyroidism in 11/2019 during routine work up with a TSH of 0.15 uIU/L , FT4 1.5 ng/dL ( 0.7-1.5 ng/dL) and an elevated T3 4.30 pg/mL ( 1.7-3.7) . She had worsening anxiety and tremors at the time , this was attributed to [place]   She has chronic GI issues and decreased oral intake hence the weight decrease Has occasional diarrhea  Denies palpitation Tremors are stable   No biotin intake    No FH of thyroid disease     She is on Methimazole 2.5 mg daily     HISTORY:  Past Medical History:  Past Medical History:  Diagnosis Date  . Cataracts, bilateral   . Gastritis   . GERD (gastroesophageal reflux disease)   . Hypertension   . Hyperthyroidism   . OSA on CPAP    Past Surgical History:  Past Surgical History:  Procedure Laterality Date  . ABDOMINAL HYSTERECTOMY    . ENDOMETRIAL ABLATION    . HERNIA REPAIR    . WRIST ARTHROSCOPY        Social History:  reports that she has never smoked. She has never used smokeless tobacco. She reports current alcohol use. She reports that she does not use drugs.  Family History: family history includes Heart disease in her father; Ovarian cancer in her mother.   HOME MEDICATIONS: Allergies as of 12/03/2020      Reactions   Erythromycin    Macrobid [nitrofurantoin]    Penicillins    Sulfa Antibiotics       Medication List       Accurate as of December 03, 2020  9:24 AM. If you  have any questions, ask your nurse or doctor.        alendronate 70 MG tablet Commonly known as: FOSAMAX Take 1 tablet (70 mg total) by mouth every 7 (seven) days. Take with a full glass of water on an empty stomach.   famotidine 20 MG tablet Commonly known as: PEPCID Take 20 mg by mouth daily.   irbesartan 75 MG tablet Commonly known as: AVAPRO Take 1 tablet (75 mg total) by mouth daily.   methimazole 5 MG tablet Commonly known as: TAPAZOLE Take 0.5 tablets (2.5 mg total) by mouth daily.   pantoprazole 40 MG tablet Commonly known as: PROTONIX Take 1 tablet (40 mg total) by mouth 2 (two) times daily.   Restasis 0.05 % ophthalmic emulsion Generic drug: cycloSPORINE 1 drop 2 (two) times daily.   rifaximin 550 MG Tabs tablet Commonly known as: XIFAXAN Take 1 tablet (550 mg total) by mouth 3 (three) times daily.         REVIEW OF SYSTEMS: A comprehensive ROS was conducted with the patient and is negative except as per HPI     OBJECTIVE:  VS: BP 130/84   Pulse 62   Ht 5\' 3"  (1.6 m)   Wt 171 lb (77.6 kg)   SpO2 98%  BMI 30.29 kg/m    Wt Readings from Last 3 Encounters:  12/03/20 171 lb (77.6 kg)  11/25/20 172 lb (78 kg)  11/17/20 173 lb (78.5 kg)     EXAM: General: Pt appears well and is in NAD  Eyes: External eye exam normal without stare, lid lag or exophthalmos.  EOM intact.    Neck: General: Supple without adenopathy. Thyroid: Thyroid size normal.  No goiter or nodules appreciated.   Lungs: Clear with good BS bilat with no rales, rhonchi, or wheezes  Heart: Auscultation: RRR.  Abdomen: Normoactive bowel sounds, soft, nontender, without masses or organomegaly palpable  Extremities:  BL LE: No pretibial edema normal ROM and strength.  Skin: Hair: Texture and amount normal with gender appropriate distribution Skin Inspection: No rashes Skin Palpation: Skin temperature, texture, and thickness normal to palpation  Neuro: Cranial nerves: II - XII grossly  intact  Motor: Normal strength throughout DTRs: 2+ and symmetric in UE without delay in relaxation phase  Mental Status: Judgment, insight: Intact Memory: Intact for recent and remote events Mood and affect: No depression, anxiety, or agitation     DATA REVIEWED: Results for MEMPHIS, CRESWELL (MRN [id number]) as of 12/03/2020 12:45  Ref. Range 12/03/2020 09:39  TSH Latest Ref Range: 0.35 - 4.50 uIU/mL 4.33  T4,Free(Direct) Latest Ref Range: 0.60 - 1.60 ng/dL 0.94      ASSESSMENT/PLAN/RECOMMENDATIONS:   1. Hyperthyroidism :   - Pt is clinically euthyroid  - No local neck symptoms  - She has been diagnosed with Graves' disease per pt.  - TSHhas trended down from 6.3 uIU/Ml    Medications : Continue methimazole 5 mg, Half a tablet  Daily     F/U in 4 months     Signed electronically by: Mack Guise, MD  [facility]  [facility] [address] [place], [place] [address] Phone: [phone] FAX: [phone]   CC: Isaac Bliss, Rayford Halsted, [place] [address] Phone: [phone] Fax: [phone]   Return to Endocrinology clinic as below: Future Appointments  Date Time Provider [place]  02/12/2021  9:30 AM Warren Danes, PA-C [facility]

## 2020-12-02 NOTE — Therapy (Signed)
[facility] [address]  [place], [place], [address] Phone: [phone]   Fax:  [phone]  Physical Therapy Evaluation  Patient Details  Name: Bethany Cole MRN: [id number] Date of Birth: 09-05-1951 Referring Provider (PT): Rosemarie Ax, MD   Encounter Date: 12/02/2020   PT End of Session - 12/02/20 1802    Visit Number 1    Number of Visits 17    Date for PT Re-Evaluation 01/27/21    Authorization Type [employer] medicare    PT Start Time 1400    PT Stop Time 1540    PT Time Calculation (min) 45 min    Activity Tolerance Patient tolerated treatment well;Patient limited by fatigue;Other (comment)   Limited by dizzylightheadedness/nausea   Behavior During Therapy WFL for tasks assessed/performed           Past Medical History:  Diagnosis Date   Cataracts, bilateral    Gastritis    GERD (gastroesophageal reflux disease)    Hypertension    Hyperthyroidism    OSA on CPAP     Past Surgical History:  Procedure Laterality Date   ABDOMINAL HYSTERECTOMY     ENDOMETRIAL ABLATION     HERNIA REPAIR     WRIST ARTHROSCOPY      There were no vitals filed for this visit.    Subjective Assessment - 12/02/20 1401    Subjective Pt recently moved to [place] from [place]. Pt reports she has not had any falls recentlybut has had a few near falls. Patient reports stumbles/falls with walking outside and especially with turning. Pt reports sometimes feeling a little lightheaded and unsteady. Also notes that when she notices she is starting to get off balance, it seems like there is no automatic response to regain balance. Was diagnosed with osteoporosis and reports having some anxiety with becoming less fit.    Pertinent History History of 2 falls with fracture of right wrist 2020, right ankle 2018. Had PT after previous fractures. history of B shoulder pain (now more pain in R shoulder), neck pain with headaches  in the past, Hypertension,Hyperthyroidism, osteoporosis, cataracts B    Limitations Walking    How long can you sit comfortably? unlimited    How long can you walk comfortably? 1 hr, limited by fatigue    Patient Stated Goals improve stamina/endurance, improve stability and balance, improve strength, decrease pain    Currently in Pain? Yes    Pain Score 4     Pain Location Shoulder    Pain Orientation Right    Pain Descriptors / Indicators Tightness    Pain Type Chronic pain    Pain Onset More than a month ago    Multiple Pain Sites Yes    Pain Score 4    Pain Location Back    Pain Orientation Lower    Pain Descriptors / Indicators Aching    Pain Type Chronic pain    Pain Radiating Towards B thighs    Pain Onset More than a month ago    Aggravating Factors  Prolonged sitting 15 + minutes    Effect of Pain on Daily Activities increases her fatigue and limits her activity tolerance once pain increases.              [facility] PT Assessment - 12/02/20 1413      Assessment   Medical Diagnosis [email or web address].Merril Abbe (ICD-10-CM) - Fall, initial encounter    Referring Provider (PT) Rosemarie Ax, MD  Hand Dominance Right    Next MD Visit --   does not currently have one   Prior Therapy yes post previous fractures, shoulder/bakc pain      Precautions   Precaution Comments Osteoporosis      Balance Screen   Has the patient fallen in the past 6 months No    Has the patient had a decrease in activity level because of a fear of falling?  Yes    Is the patient reluctant to leave their home because of a fear of falling?  Yes      [place] Private residence    Living Arrangements --   renter lives with her   Available Help at Discharge Family    Type of [place] Access Level entry   previous home had 2 flights   Home Layout One level    [place] None      Prior Function   Level of Independence Independent    Vocation Retired    Leisure  gardening, walking with friends, walking the dog      Cognition   Overall Cognitive Status Within Functional Limits for tasks assessed      Coordination   Gross Motor Movements are Fluid and Coordinated Yes    Heel Shin Test WFL B      ROM / Strength   AROM / PROM / Strength Strength      Strength   Strength Assessment Site Hip;Knee;Ankle;Shoulder;Elbow    Right/Left Hip Right;Left    Right Hip Flexion 3+/5    Right Hip ADduction 3/5    Left Hip Flexion 3+/5    Left Hip ADduction 3/5      Balance   Balance Assessed Yes    Balance comment MCTSIB 1:30 sec WFL, 2: 30 sec mod sway, 3: 30 sec min sway, 4 : 30 sec mod sway   " very concentrated"     Standardized Balance Assessment   Standardized Balance Assessment Berg Balance Test;Dynamic Gait Index      Berg Balance Test   Sit to Stand Able to stand without using hands and stabilize independently    Standing Unsupported Able to stand safely 2 minutes    Sitting with Back Unsupported but Feet Supported on Floor or Stool Able to sit safely and securely 2 minutes    Stand to Sit Sits safely with minimal use of hands    Transfers Able to transfer safely, minor use of hands    Standing Unsupported with Eyes Closed Able to stand 10 seconds safely    Standing Unsupported with Feet Together Able to place feet together independently and stand 1 minute safely    From Standing, Reach Forward with Outstretched Arm Can reach forward >12 cm safely (5")    From Standing Position, Pick up Object from Floor Able to pick up shoe safely and easily   reports times when she does it and gets lightheaded   From Standing Position, Turn to Look Behind Over each Shoulder Looks behind from both sides and weight shifts well    Turn 360 Degrees Able to turn 360 degrees safely one side only in 4 seconds or less   5/10 dizziness   Standing Unsupported, Alternately Place Feet on Step/Stool Able to complete 4 steps without aid or supervision    Standing  Unsupported, One Foot in Front Needs help to step but can hold 15 seconds    Standing on One  Leg Able to lift leg independently and hold 5-10 seconds   6" on R, 8" on L   Total Score 48    Berg comment: moderate falls risk      Dynamic Gait Index   Level Surface Normal    Change in Gait Speed Mild Impairment    Gait with Horizontal Head Turns Mild Impairment   nausea 3/10   Gait with Vertical Head Turns Mild Impairment   nausea 3/10   Gait and Pivot Turn Moderate Impairment    Step Over Obstacle Mild Impairment   nausea 3/10 with looking down   Step Around Obstacles Mild Impairment    Steps --   to be assessed                     Objective measurements completed on examination: See above findings.               PT Education - 12/02/20 1801    Education Details PT POC and HEP, balance and falls prevention. HEP Access Code: [email or web address]    Person(s) Educated Patient    Methods Explanation;Demonstration;Handout    Comprehension Verbalized understanding;Returned demonstration;Need further instruction            PT Short Term Goals - 12/02/20 1805      PT SHORT TERM GOAL #1   Title Pt will be independent with initial HEP    Time 2    Period Weeks    Status New    Target Date 12/16/20             PT Long Term Goals - 12/02/20 1806      PT LONG TERM GOAL #1   Title Pt will score at least 53/56 on berg to demo low fall risk    Time 8    Period Weeks    Status New    Target Date 01/28/21      PT LONG TERM GOAL #2   Title Pt will score at least 19/24 to demo decreased fall risk with community negotiation.    Time 8    Period Weeks    Status New    Target Date 01/28/21      PT LONG TERM GOAL #3   Title Pt will demo improved quality of eye movements without symptoms (no lightheaded, dizziness, or nausea) with VOR exercises to facilitate improved vestibular function for balance in community    Time 8    Period Weeks    Status New    Target Date  01/28/21      PT LONG TERM GOAL #4   Title Pt will be able to get up and down from the floor safely, with good control and no LOB, to demo improved functional strength    Time 8    Period Weeks    Status New    Target Date 01/28/21                  Plan - 12/02/20 1803    Clinical Impression Statement Pt is a kind 70 yo female with osteoporosis and history of falls with fractures. She currently presents with moderate falls risk as per the berg, decreased BLE strength, diminished SLS balance, and abnormal visual tracking with low function VOR. Balance with head movements is most difficult (vertical seem to cause increased symptoms compared to horizontal), dizziness with looking . As a result she has difficulty with endurance for daily activities and high anxiety of falls  with fear of fracturing again. Pt will benefit from skilled PT to decrease pain and improve strength, balance, VOR function, endurance and stability with gait to decrease falls risk.    Personal Factors and Comorbidities Comorbidity 2    Comorbidities History of 2 falls with fracture of right wrist 2020, right ankle 2018. Had PT after previous fractures. history of B shoulder pain (now more pain in R shoulder), neck pain with headaches in the past, Hypertension,Hyperthyroidism, osteoporosis, cataracts B    Examination-Activity Limitations Locomotion Level;Reach Overhead;Bend;Carry;Stairs;Squat    Examination-Participation Restrictions Cleaning;Community Activity;Interpersonal Relationship;Driving;Volunteer    Stability/Clinical Decision Making Evolving/Moderate complexity    Clinical Decision Making Moderate    Rehab Potential Good    PT Frequency 2x / week    PT Duration 8 weeks    PT Treatment/Interventions ADLs/Self Care Home Management;Electrical Stimulation;Moist Heat;Cryotherapy;DME Instruction;Neuromuscular re-education;Balance training;Therapeutic exercise;Therapeutic activities;Functional mobility training;Stair  training;Gait training;Patient/family education;Manual techniques;Energy conservation;Taping;Vestibular    PT Next Visit Plan reassess HEP. Possibly assess ABC scale questionnaire next visit. Progress strengthening, balance, VOR training as tolerated. Can further assess vestibular next visit. Assess steps to complete DGI next visit.    PT Home Exercise Plan See pt instructions    Consulted and Agree with Plan of Care Patient           Patient will benefit from skilled therapeutic intervention in order to improve the following deficits and impairments:  Abnormal gait,Decreased range of motion,Difficulty walking,Increased muscle spasms,Decreased endurance,Dizziness,Pain,Improper body mechanics,Impaired flexibility,Decreased balance,Decreased mobility,Decreased strength  Visit Diagnosis: Unsteadiness on feet - Plan: PT plan of care cert/re-cert  Other abnormalities of gait and mobility - Plan: PT plan of care cert/re-cert  Muscle weakness (generalized) - Plan: PT plan of care cert/re-cert  Abnormal posture - Plan: PT plan of care cert/re-cert  Dizziness and giddiness - Plan: PT plan of care cert/re-cert  History of falling - Plan: PT plan of care cert/re-cert  Chronic right shoulder pain - Plan: PT plan of care cert/re-cert  Chronic low back pain, unspecified back pain laterality, unspecified whether sciatica present - Plan: PT plan of care cert/re-cert     Problem List Patient Active Problem List   Diagnosis Date Noted   Fall 11/17/2020   Hyperlipemia 11/07/2020   Osteoporosis 09/30/2020   Hyperthyroidism    Hypertension    GERD (gastroesophageal reflux disease)    Gastritis    Cataracts, bilateral     Hall Busing, PT, DPT 12/03/2020, 10:19 AM  [facility] [address]  [place], [place], [address] Phone: [phone]   Fax:  [phone]  Name: Bethany Cole MRN: [id number] Date of Birth:  1950/11/22

## 2020-12-03 ENCOUNTER — Encounter: Payer: Self-pay | Admitting: Internal Medicine

## 2020-12-03 ENCOUNTER — Ambulatory Visit: Payer: Medicare Other | Admitting: Internal Medicine

## 2020-12-03 VITALS — BP 130/84 | HR 62 | Ht 63.0 in | Wt 171.0 lb

## 2020-12-03 DIAGNOSIS — E0590 Thyrotoxicosis, unspecified without thyrotoxic crisis or storm: Secondary | ICD-10-CM

## 2020-12-03 LAB — TSH: TSH: 4.33 u[IU]/mL (ref 0.35–4.50)

## 2020-12-03 LAB — T4, FREE: Free T4: 0.94 ng/dL (ref 0.60–1.60)

## 2020-12-03 MED ORDER — METHIMAZOLE 5 MG PO TABS: 2.5000 mg | ORAL_TABLET | Freq: Every day | ORAL | 1 refills | Status: DC

## 2020-12-07 LAB — TRAB (TSH RECEPTOR BINDING ANTIBODY): TRAB: 1.02 IU/L (ref <=2.00)

## 2020-12-15 ENCOUNTER — Ambulatory Visit: Payer: Medicare Other | Admitting: Physical Therapy

## 2020-12-15 ENCOUNTER — Encounter: Payer: Self-pay | Admitting: Physical Therapy

## 2020-12-15 ENCOUNTER — Other Ambulatory Visit: Payer: Self-pay

## 2020-12-15 DIAGNOSIS — M545 Low back pain, unspecified: Secondary | ICD-10-CM | POA: Diagnosis not present

## 2020-12-15 DIAGNOSIS — Z9181 History of falling: Secondary | ICD-10-CM

## 2020-12-15 DIAGNOSIS — M25511 Pain in right shoulder: Secondary | ICD-10-CM | POA: Diagnosis not present

## 2020-12-15 DIAGNOSIS — R2681 Unsteadiness on feet: Secondary | ICD-10-CM

## 2020-12-15 DIAGNOSIS — R2689 Other abnormalities of gait and mobility: Secondary | ICD-10-CM | POA: Diagnosis not present

## 2020-12-15 DIAGNOSIS — G8929 Other chronic pain: Secondary | ICD-10-CM | POA: Diagnosis not present

## 2020-12-15 DIAGNOSIS — R42 Dizziness and giddiness: Secondary | ICD-10-CM | POA: Diagnosis not present

## 2020-12-15 DIAGNOSIS — R293 Abnormal posture: Secondary | ICD-10-CM

## 2020-12-15 DIAGNOSIS — M6281 Muscle weakness (generalized): Secondary | ICD-10-CM

## 2020-12-15 NOTE — Therapy (Signed)
[facility] [address]  [place], [place], [address] Phone: [phone]   Fax:  [phone]  Physical Therapy Treatment  Patient Details  Name: Bethany Cole MRN: [id number] Date of Birth: [birth year] Referring Provider (PT): Rosemarie Ax, MD   Encounter Date: 12/15/2020   PT End of Session - 12/15/20 1451    Visit Number 2    Number of Visits 17    Date for PT Re-Evaluation 01/27/21    Authorization Type [employer] medicare    PT Start Time 8891    PT Stop Time 1444    PT Time Calculation (min) 45 min    Equipment Utilized During Treatment Gait belt    Activity Tolerance Patient tolerated treatment well    Behavior During Therapy [facility] for tasks assessed/performed           Past Medical History:  Diagnosis Date  . Cataracts, bilateral   . Gastritis   . GERD (gastroesophageal reflux disease)   . Hypertension   . Hyperthyroidism   . OSA on CPAP     Past Surgical History:  Procedure Laterality Date  . ABDOMINAL HYSTERECTOMY    . ENDOMETRIAL ABLATION    . HERNIA REPAIR    . WRIST ARTHROSCOPY      There were no vitals filed for this visit.   Subjective Assessment - 12/15/20 1400    Subjective Had started doing her HEP consistently but then she got sick. Noticed some difficulty tracking with her eyes during her exercises.    Pertinent History History of 2 falls with fracture of right wrist 2020, right ankle 2018. Had PT after previous fractures. history of B shoulder pain (now more pain in R shoulder), neck pain with headaches in the past, Hypertension,Hyperthyroidism, osteoporosis, cataracts B    Patient Stated Goals improve stamina/endurance, improve stability and balance, improve strength, decrease pain    Currently in Pain? Yes    Pain Score 4    Pain Location Knee    Pain Orientation Right    Pain Descriptors / Indicators Aching    Pain Type Chronic pain              [facility] PT Assessment  - 12/15/20 0001      Dynamic Gait Index   Level Surface Normal    Change in Gait Speed Mild Impairment    Gait with Horizontal Head Turns Mild Impairment    Gait with Vertical Head Turns Mild Impairment    Gait and Pivot Turn Moderate Impairment    Step Over Obstacle Mild Impairment    Step Around Obstacles Mild Impairment    Steps Mild Impairment    Total Score 16               Vestibular Assessment - 12/15/20 1418      Symptom Behavior   Frequency of Dizziness pt unsure    Duration of Dizziness seconds    Symptom Nature Motion provoked    Aggravating Factors Forward bending;Turning head quickly    Relieving Factors Head stationary      Oculomotor Exam   Oculomotor Alignment Abnormal   pt reporting "i'm cross eyed"   Ocular ROM WNL    Spontaneous Absent    Gaze-induced  Right beating nystagmus with R gaze;Right beating nystagmus with L gaze   very brief and unreproducable   Smooth Pursuits Saccades   slight deviation with vertical direction; c/o dizziness   Saccades Undershoots  on L     Oculomotor Exam-Fixation Suppressed    Left Head Impulse intact   "lightheaded"   Right Head Impulse intact   "lightheaded"     Vestibulo-Ocular Reflex   VOR 1 Head Only (x 1 viewing) vertical- poor gaze stabilization & mild dizziness (using R eye as point of reference d/t L eye visual impairment) horizontal- intact; c/o dizziness    VOR Cancellation Normal   "little lightheaded"                   [facility] Adult PT Treatment/Exercise - 12/15/20 0001      Exercises   Exercises Knee/Hip      Knee/Hip Exercises: Aerobic   Nustep L3 x 6 min (UEs/LEs)           Vestibular Treatment/Exercise - 12/15/20 0001      Vestibular Treatment/Exercise   Vestibular Treatment Provided Gaze    Gaze Exercises X1 Viewing Horizontal;X1 Viewing Vertical      X1 Viewing Horizontal   Foot Position standing    Reps 10    Comments 4/10 lightheadedness      X1 Viewing Vertical   Foot  Position standing    Reps 10    Comments 5-6/10 lightheadedness                 PT Education - 12/15/20 1451    Education Details edu on VOR training and appropriate levels of dizziness    Person(s) Educated Patient    Methods Explanation;Demonstration;Tactile cues;Verbal cues    Comprehension Verbalized understanding;Returned demonstration            PT Short Term Goals - 12/15/20 1408      PT SHORT TERM GOAL #1   Title Pt will be independent with initial HEP    Time 2    Period Weeks    Status On-going    Target Date 12/16/20             PT Long Term Goals - 12/15/20 1408      PT LONG TERM GOAL #1   Title Pt will score at least 53/56 on berg to demo low fall risk    Time 8    Period Weeks    Status New      PT LONG TERM GOAL #2   Title Pt will score at least 19/24 to demo decreased fall risk with community negotiation.    Time 8    Period Weeks    Status New      PT LONG TERM GOAL #3   Title Pt will demo improved quality of eye movements without symptoms (no lightheaded, dizziness, or nausea) with VOR exercises to facilitate improved vestibular function for balance in community    Time 8    Period Weeks    Status New      PT LONG TERM GOAL #4   Title Pt will be able to get up and down from the floor safely, with good control and no LOB, to demo improved functional strength    Time 8    Period Weeks    Status New      PT LONG TERM GOAL #5   Title Patient to improve ABC score to 90% to decrease risk of falls.    Baseline 86% 12/15/20    Period Weeks    Status New    Target Date 01/28/21  Plan - 12/15/20 1452    Clinical Impression Statement Patient reporting limited compliance with HEP d/t getting sick since recent evaluation. Denies questions on HEP. ABC scale demonstrated 86% balance confidence, demonstrating room for improvement. DGI was completed- patient scored 16/24, indicating increased risk of falls. Oculomotor  exam revealed abnormal oculomotor alignment, questionable direction-changing nystagmus with R and L gaze, path deviation with vertical smooth pursuits, undershooting with L saccades, and dizziness and difficulty with gaze stabilization with vertical and horizontal VOR. Standing VOR training was performed with patient reporting most dizziness with vertical VOR- educated patient on appropriate levels of dizziness to reach with these exercises and basis of VOR training. Patient reported understanding and without complaints at end of session.    Comorbidities History of 2 falls with fracture of right wrist 2020, right ankle 2018. Had PT after previous fractures. history of B shoulder pain (now more pain in R shoulder), neck pain with headaches in the past, Hypertension,Hyperthyroidism, osteoporosis, cataracts B    PT Treatment/Interventions ADLs/Self Care Home Management;Electrical Stimulation;Moist Heat;Cryotherapy;DME Instruction;Neuromuscular re-education;Balance training;Therapeutic exercise;Therapeutic activities;Functional mobility training;Stair training;Gait training;Patient/family education;Manual techniques;Energy conservation;Taping;Vestibular    PT Next Visit Plan reassess HEP. Progress strengthening, balance, VOR training as tolerated. Can further assess vestibular next visit. Assess steps to complete DGI next visit.    Consulted and Agree with Plan of Care Patient           Patient will benefit from skilled therapeutic intervention in order to improve the following deficits and impairments:  Abnormal gait,Decreased range of motion,Difficulty walking,Increased muscle spasms,Decreased endurance,Dizziness,Pain,Improper body mechanics,Impaired flexibility,Decreased balance,Decreased mobility,Decreased strength  Visit Diagnosis: Unsteadiness on feet  Other abnormalities of gait and mobility  Muscle weakness (generalized)  Abnormal posture  Dizziness and giddiness  History of  falling  Chronic right shoulder pain  Chronic low back pain, unspecified back pain laterality, unspecified whether sciatica present     Problem List Patient Active Problem List   Diagnosis Date Noted  . Fall 11/17/2020  . Hyperlipemia 11/07/2020  . Osteoporosis 09/30/2020  . Hyperthyroidism   . Hypertension   . GERD (gastroesophageal reflux disease)   . Gastritis   . Cataracts, bilateral      Janene Harvey, PT, DPT 12/15/20 2:54 PM   [facility] [address]  [place], [place], [address] Phone: [phone]   Fax:  [phone]  Name: Bethany Cole MRN: [id number] Date of Birth: 02-24-1951

## 2020-12-16 ENCOUNTER — Encounter: Payer: Medicare Other | Admitting: Internal Medicine

## 2020-12-29 ENCOUNTER — Ambulatory Visit: Payer: Medicare Other | Admitting: Physical Therapy

## 2020-12-31 ENCOUNTER — Ambulatory Visit: Payer: Medicare Other | Attending: Family Medicine | Admitting: Physical Therapy

## 2020-12-31 ENCOUNTER — Other Ambulatory Visit: Payer: Self-pay

## 2020-12-31 ENCOUNTER — Encounter: Payer: Self-pay | Admitting: Physical Therapy

## 2020-12-31 DIAGNOSIS — R2689 Other abnormalities of gait and mobility: Secondary | ICD-10-CM | POA: Diagnosis not present

## 2020-12-31 DIAGNOSIS — R42 Dizziness and giddiness: Secondary | ICD-10-CM | POA: Insufficient documentation

## 2020-12-31 DIAGNOSIS — M25511 Pain in right shoulder: Secondary | ICD-10-CM | POA: Insufficient documentation

## 2020-12-31 DIAGNOSIS — R2681 Unsteadiness on feet: Secondary | ICD-10-CM | POA: Diagnosis not present

## 2020-12-31 DIAGNOSIS — M545 Low back pain, unspecified: Secondary | ICD-10-CM | POA: Diagnosis not present

## 2020-12-31 DIAGNOSIS — M6281 Muscle weakness (generalized): Secondary | ICD-10-CM

## 2020-12-31 DIAGNOSIS — Z9181 History of falling: Secondary | ICD-10-CM

## 2020-12-31 DIAGNOSIS — R293 Abnormal posture: Secondary | ICD-10-CM | POA: Insufficient documentation

## 2020-12-31 DIAGNOSIS — G8929 Other chronic pain: Secondary | ICD-10-CM | POA: Insufficient documentation

## 2020-12-31 MED ORDER — RIFAXIMIN 550 MG PO TABS
550.0000 mg | ORAL_TABLET | Freq: Three times a day (TID) | ORAL | 0 refills | Status: DC
Start: 2020-12-31 — End: 2021-03-26

## 2020-12-31 NOTE — Therapy (Signed)
[facility] [address]  [place], [place], [address] Phone: [phone]   Fax:  [phone]  Physical Therapy Treatment  Patient Details  Name: Bethany Cole MRN: [id number] Date of Birth: 1951-02-02 Referring Provider (PT): Rosemarie Ax, MD   Encounter Date: 12/31/2020   PT End of Session - 12/31/20 1144    Visit Number 3    Number of Visits 17    Date for PT Re-Evaluation 01/27/21    Authorization Type [employer]    PT Start Time 1104    PT Stop Time 1143    PT Time Calculation (min) 39 min    Equipment Utilized During Treatment Gait belt    Activity Tolerance Patient tolerated treatment well    Behavior During Therapy [facility] for tasks assessed/performed           Past Medical History:  Diagnosis Date  . Cataracts, bilateral   . Gastritis   . GERD (gastroesophageal reflux disease)   . Hypertension   . Hyperthyroidism   . OSA on CPAP     Past Surgical History:  Procedure Laterality Date  . ABDOMINAL HYSTERECTOMY    . ENDOMETRIAL ABLATION    . HERNIA REPAIR    . WRIST ARTHROSCOPY      There were no vitals filed for this visit.   Subjective Assessment - 12/31/20 1105    Subjective Requesting another copy of her HEP as she lost her copy.    Pertinent History History of 2 falls with fracture of right wrist 2020, right ankle 2018. Had PT after previous fractures. history of B shoulder pain (now more pain in R shoulder), neck pain with headaches in the past, Hypertension,Hyperthyroidism, osteoporosis, cataracts B    Patient Stated Goals improve stamina/endurance, improve stability and balance, improve strength, decrease pain    Currently in Pain? Yes    Pain Score 6    Pain Location Hip    Pain Orientation Right    Pain Descriptors / Indicators --   unable to describe   Pain Type Acute pain    Pain Radiating Towards thigh              [facility] PT Assessment - 12/31/20 0001      ROM /  Strength   AROM / PROM / Strength AROM      AROM   AROM Assessment Site Ankle    Right/Left Ankle Right;Left    Right Ankle Dorsiflexion 2    Left Ankle Dorsiflexion 1                         [facility] Adult PT Treatment/Exercise - 12/31/20 0001      Neuro Re-ed    Neuro Re-ed Details  STS with EC 2x5      Knee/Hip Exercises: Stretches   Gastroc Stretch Right;Left;30 seconds;2 reps   runner's stretch     Knee/Hip Exercises: Aerobic   Recumbent Bike L3x6 min      Knee/Hip Exercises: Standing   Hip Abduction Stengthening;Right;Left;1 set;10 reps;Knee straight    Abduction Limitations at TM rail   pt reporting challenge, most on R   Hip Extension Stengthening;Right;Left;1 set;10 reps;Knee straight    Extension Limitations at TM rail      Knee/Hip Exercises: Seated   Clamshell with TheraBand Red   2x10   Sit to Sand 2 sets;without UE support   + gaze stabilization; considerable B valgus collapse;  2nd set with red TB above knees                 PT Education - 12/31/20 1110    Education Details re-administered/updated HEP    Person(s) Educated Patient    Methods Explanation;Demonstration;Tactile cues;Verbal cues;Handout    Comprehension Verbalized understanding            PT Short Term Goals - 12/31/20 1150      PT SHORT TERM GOAL #1   Title Pt will be independent with initial HEP    Time 2    Period Weeks    Status Achieved    Target Date 12/16/20             PT Long Term Goals - 12/31/20 1150      PT LONG TERM GOAL #1   Title Pt will score at least 53/56 on berg to demo low fall risk    Time 8    Period Weeks    Status On-going      PT LONG TERM GOAL #2   Title Pt will score at least 19/24 to demo decreased fall risk with community negotiation.    Time 8    Period Weeks    Status On-going      PT LONG TERM GOAL #3   Title Pt will demo improved quality of eye movements without symptoms (no lightheaded, dizziness, or nausea) with  VOR exercises to facilitate improved vestibular function for balance in community    Time 8    Period Weeks    Status On-going      PT LONG TERM GOAL #4   Title Pt will be able to get up and down from the floor safely, with good control and no LOB, to demo improved functional strength    Time 8    Period Weeks    Status On-going      PT LONG TERM GOAL #5   Title Patient to improve ABC score to 90% to decrease risk of falls.    Baseline 86% 12/15/20    Period Weeks    Status On-going                 Plan - 12/31/20 1144    Clinical Impression Statement Patient noting R hip pain developing over the past few weeks. Otherwise without new issues. Worked on STS with patient demonstrating considerable B valgus collapse. Attempted to correct with banded resistance, however patient still with this tendency despite patient's effort. Worked on isolated resisted clams with better success. Patient required cueing for anterior trunk lean with STS to improve control and avoid patient "hoisting" up to sit with weight shifted posteriorly. Much improved form after cueing and practice. Patient remarking on tightness in her R ankle, thus assessed ankle DF ROM which was limited B. Followed with gentle gastroc stretching. Updated HEP with new exercises that were well-tolerated today. Patient reported understanding and without complaints at end of session.    Comorbidities History of 2 falls with fracture of right wrist 2020, right ankle 2018. Had PT after previous fractures. history of B shoulder pain (now more pain in R shoulder), neck pain with headaches in the past, Hypertension,Hyperthyroidism, osteoporosis, cataracts B    PT Treatment/Interventions ADLs/Self Care Home Management;Electrical Stimulation;Moist Heat;Cryotherapy;DME Instruction;Neuromuscular re-education;Balance training;Therapeutic exercise;Therapeutic activities;Functional mobility training;Stair training;Gait training;Patient/family  education;Manual techniques;Energy conservation;Taping;Vestibular    PT Next Visit Plan Progress strengthening, balance, VOR training as tolerated.    Consulted and Agree with Plan of  Care Patient           Patient will benefit from skilled therapeutic intervention in order to improve the following deficits and impairments:  Abnormal gait,Decreased range of motion,Difficulty walking,Increased muscle spasms,Decreased endurance,Dizziness,Pain,Improper body mechanics,Impaired flexibility,Decreased balance,Decreased mobility,Decreased strength  Visit Diagnosis: Unsteadiness on feet  Other abnormalities of gait and mobility  Muscle weakness (generalized)  Abnormal posture  Dizziness and giddiness  History of falling  Chronic right shoulder pain  Chronic low back pain, unspecified back pain laterality, unspecified whether sciatica present     Problem List Patient Active Problem List   Diagnosis Date Noted  . Fall 11/17/2020  . Hyperlipemia 11/07/2020  . Osteoporosis 09/30/2020  . Hyperthyroidism   . Hypertension   . GERD (gastroesophageal reflux disease)   . Gastritis   . Cataracts, bilateral     Bethany Cole, PT, DPT 12/31/20 11:52 AM   [facility] [address]  [place], [place], [address] Phone: [phone]   Fax:  [phone]  Name: Bethany Cole MRN: [id number] Date of Birth: 05/01/1951

## 2021-01-02 ENCOUNTER — Encounter: Payer: Medicare Other | Admitting: Physical Therapy

## 2021-01-05 ENCOUNTER — Other Ambulatory Visit: Payer: Self-pay

## 2021-01-05 ENCOUNTER — Ambulatory Visit: Payer: Medicare Other | Admitting: Physical Therapy

## 2021-01-05 ENCOUNTER — Encounter: Payer: Self-pay | Admitting: Physical Therapy

## 2021-01-05 DIAGNOSIS — R42 Dizziness and giddiness: Secondary | ICD-10-CM

## 2021-01-05 DIAGNOSIS — Z9181 History of falling: Secondary | ICD-10-CM | POA: Diagnosis not present

## 2021-01-05 DIAGNOSIS — R293 Abnormal posture: Secondary | ICD-10-CM | POA: Diagnosis not present

## 2021-01-05 DIAGNOSIS — M545 Low back pain, unspecified: Secondary | ICD-10-CM

## 2021-01-05 DIAGNOSIS — M6281 Muscle weakness (generalized): Secondary | ICD-10-CM

## 2021-01-05 DIAGNOSIS — G8929 Other chronic pain: Secondary | ICD-10-CM | POA: Diagnosis not present

## 2021-01-05 DIAGNOSIS — R2681 Unsteadiness on feet: Secondary | ICD-10-CM | POA: Diagnosis not present

## 2021-01-05 DIAGNOSIS — R2689 Other abnormalities of gait and mobility: Secondary | ICD-10-CM | POA: Diagnosis not present

## 2021-01-05 DIAGNOSIS — M25511 Pain in right shoulder: Secondary | ICD-10-CM | POA: Diagnosis not present

## 2021-01-05 NOTE — Therapy (Signed)
[facility] [address]  [place], [place], [address] Phone: [phone]   Fax:  [phone]  Physical Therapy Treatment  Patient Details  Name: Bethany Cole MRN: [id number] Date of Birth: [birth year] Referring Provider (PT): Rosemarie Ax, MD   Encounter Date: 01/05/2021   PT End of Session - 01/05/21 1354    Visit Number 4    Number of Visits 17    Date for PT Re-Evaluation 01/27/21    Authorization Type [employer]    PT Start Time 1311    PT Stop Time 1353    PT Time Calculation (min) 42 min    Activity Tolerance Patient tolerated treatment well    Behavior During Therapy [facility] for tasks assessed/performed           Past Medical History:  Diagnosis Date  . Cataracts, bilateral   . Gastritis   . GERD (gastroesophageal reflux disease)   . Hypertension   . Hyperthyroidism   . OSA on CPAP     Past Surgical History:  Procedure Laterality Date  . ABDOMINAL HYSTERECTOMY    . ENDOMETRIAL ABLATION    . HERNIA REPAIR    . WRIST ARTHROSCOPY      There were no vitals filed for this visit.   Subjective Assessment - 01/05/21 1313    Subjective Reports that she often feels weak in her ankles- notes that she recently turned in a circle and has trouble keeping herself up. Has a hx of R ankle fx.    Pertinent History History of 2 falls with fracture of right wrist 2020, right ankle 2018. Had PT after previous fractures. history of B shoulder pain (now more pain in R shoulder), neck pain with headaches in the past, Hypertension,Hyperthyroidism, osteoporosis, cataracts B    Patient Stated Goals improve stamina/endurance, improve stability and balance, improve strength, decrease pain    Currently in Pain? Yes    Pain Score 6    Pain Location Hip    Pain Orientation Right    Pain Descriptors / Indicators Aching    Pain Type Acute pain              [facility] PT Assessment - 01/05/21 0001      Strength    Strength Assessment Site Ankle    Right/Left Ankle Right;Left    Right Ankle Dorsiflexion 4-/5    Right Ankle Plantar Flexion 4/5   4 reps   Right Ankle Inversion 4-/5    Right Ankle Eversion 4-/5    Left Ankle Dorsiflexion 4/5    Left Ankle Plantar Flexion 4+/5   10 reps   Left Ankle Inversion 4-/5    Left Ankle Eversion 4-/5      Flexibility   Soft Tissue Assessment /Muscle Length yes    ITB B ober's negative                         [facility] Adult PT Treatment/Exercise - 01/05/21 0001      Exercises   Exercises Ankle      Knee/Hip Exercises: Aerobic   Nustep L4 x 6 min (UEs/LEs)      Knee/Hip Exercises: Standing   Heel Raises Both;1 set;20 reps    Heel Raises Limitations cues to maintain weight over ball of toes d/t falling into eversion      Knee/Hip Exercises: Supine   Bridges with Diona Foley Squeeze Strengthening;Both;1 set;10 reps   limited  ROM   Bridges with Clamshell Strengthening;1 set;10 reps   red TB; limited ROM     Knee/Hip Exercises: Sidelying   Clams 10x each   c/o pain with end range R hip ER, advised to move within tolerable ROM     Ankle Exercises: Seated   Other Seated Ankle Exercises R/L ankle inversion/eversion with yellow TB 10x   difficulty but good form                 PT Education - 01/05/21 1354    Education Details update to HEP    Person(s) Educated Patient    Methods Explanation;Demonstration;Tactile cues;Verbal cues;Handout    Comprehension Verbalized understanding;Returned demonstration            PT Short Term Goals - 12/31/20 1150      PT SHORT TERM GOAL #1   Title Pt will be independent with initial HEP    Time 2    Period Weeks    Status Achieved    Target Date 12/16/20             PT Long Term Goals - 12/31/20 1150      PT LONG TERM GOAL #1   Title Pt will score at least 53/56 on berg to demo low fall risk    Time 8    Period Weeks    Status On-going      PT LONG TERM GOAL #2   Title Pt will  score at least 19/24 to demo decreased fall risk with community negotiation.    Time 8    Period Weeks    Status On-going      PT LONG TERM GOAL #3   Title Pt will demo improved quality of eye movements without symptoms (no lightheaded, dizziness, or nausea) with VOR exercises to facilitate improved vestibular function for balance in community    Time 8    Period Weeks    Status On-going      PT LONG TERM GOAL #4   Title Pt will be able to get up and down from the floor safely, with good control and no LOB, to demo improved functional strength    Time 8    Period Weeks    Status On-going      PT LONG TERM GOAL #5   Title Patient to improve ABC score to 90% to decrease risk of falls.    Baseline 86% 12/15/20    Period Weeks    Status On-going                 Plan - 01/05/21 1354    Clinical Impression Statement Patient reporting feeling of weakness in her ankles and remaining R hip pain. B ankle strength was assessed, revealing considerable weakness and poor stability with single leg heel raises. Worked on ankle strengthening ther-ex to address these deficits, with cues required to correct form and to avoid compensations. Patient performed progressive glute strengthening ther-ex to address strength deficits with c/o difficulty. Patient did c/o R hip pain with R clamshells, which improved when moving within limited ROM. Updated HEP with ther-ex which was well-tolerated today. Patient reported understanding and without complaints at end of session.    Comorbidities History of 2 falls with fracture of right wrist 2020, right ankle 2018. Had PT after previous fractures. history of B shoulder pain (now more pain in R shoulder), neck pain with headaches in the past, Hypertension,Hyperthyroidism, osteoporosis, cataracts B    PT Treatment/Interventions ADLs/Self Care  Home Management;Electrical Stimulation;Moist Heat;Cryotherapy;DME Instruction;Neuromuscular re-education;Balance  training;Therapeutic exercise;Therapeutic activities;Functional mobility training;Stair training;Gait training;Patient/family education;Manual techniques;Energy conservation;Taping;Vestibular    PT Next Visit Plan Progress strengthening, balance, VOR training as tolerated.    Consulted and Agree with Plan of Care Patient           Patient will benefit from skilled therapeutic intervention in order to improve the following deficits and impairments:  Abnormal gait,Decreased range of motion,Difficulty walking,Increased muscle spasms,Decreased endurance,Dizziness,Pain,Improper body mechanics,Impaired flexibility,Decreased balance,Decreased mobility,Decreased strength  Visit Diagnosis: Unsteadiness on feet  Other abnormalities of gait and mobility  Muscle weakness (generalized)  Abnormal posture  Dizziness and giddiness  History of falling  Chronic right shoulder pain  Chronic low back pain, unspecified back pain laterality, unspecified whether sciatica present     Problem List Patient Active Problem List   Diagnosis Date Noted  . Fall 11/17/2020  . Hyperlipemia 11/07/2020  . Osteoporosis 09/30/2020  . Hyperthyroidism   . Hypertension   . GERD (gastroesophageal reflux disease)   . Gastritis   . Cataracts, bilateral     Janene Harvey, PT, DPT 01/05/21 1:56 PM    [facility] [address]  [place], [place], [address] Phone: [phone]   Fax:  [phone]  Name: Jeaninne Lodico MRN: [id number] Date of Birth: Feb 08, 1951

## 2021-01-14 ENCOUNTER — Ambulatory Visit: Payer: Medicare Other | Admitting: Physical Therapy

## 2021-01-14 ENCOUNTER — Encounter: Payer: Self-pay | Admitting: Physical Therapy

## 2021-01-14 ENCOUNTER — Other Ambulatory Visit: Payer: Self-pay

## 2021-01-14 DIAGNOSIS — R2689 Other abnormalities of gait and mobility: Secondary | ICD-10-CM | POA: Diagnosis not present

## 2021-01-14 DIAGNOSIS — Z9181 History of falling: Secondary | ICD-10-CM | POA: Diagnosis not present

## 2021-01-14 DIAGNOSIS — M545 Low back pain, unspecified: Secondary | ICD-10-CM

## 2021-01-14 DIAGNOSIS — R2681 Unsteadiness on feet: Secondary | ICD-10-CM

## 2021-01-14 DIAGNOSIS — M6281 Muscle weakness (generalized): Secondary | ICD-10-CM

## 2021-01-14 DIAGNOSIS — M25511 Pain in right shoulder: Secondary | ICD-10-CM | POA: Diagnosis not present

## 2021-01-14 DIAGNOSIS — R42 Dizziness and giddiness: Secondary | ICD-10-CM | POA: Diagnosis not present

## 2021-01-14 DIAGNOSIS — R293 Abnormal posture: Secondary | ICD-10-CM | POA: Diagnosis not present

## 2021-01-14 DIAGNOSIS — G8929 Other chronic pain: Secondary | ICD-10-CM

## 2021-01-14 NOTE — Therapy (Signed)
[facility] [address]  [place], [place], [address] Phone: [phone]   Fax:  [phone]  Physical Therapy Treatment  Patient Details  Name: Bethany Cole MRN: [id number] Date of Birth: 1950/12/13 Referring Provider (PT): Rosemarie Ax, MD   Encounter Date: 01/14/2021   PT End of Session - 01/14/21 1356    Visit Number 5    Number of Visits 17    Date for PT Re-Evaluation 01/27/21    Authorization Type [employer] medicare    PT Start Time 1517    PT Stop Time 1354    PT Time Calculation (min) 40 min    Equipment Utilized During Treatment Gait belt    Activity Tolerance Patient tolerated treatment well    Behavior During Therapy [facility] for tasks assessed/performed           Past Medical History:  Diagnosis Date  . Cataracts, bilateral   . Gastritis   . GERD (gastroesophageal reflux disease)   . Hypertension   . Hyperthyroidism   . OSA on CPAP     Past Surgical History:  Procedure Laterality Date  . ABDOMINAL HYSTERECTOMY    . ENDOMETRIAL ABLATION    . HERNIA REPAIR    . WRIST ARTHROSCOPY      There were no vitals filed for this visit.   Subjective Assessment - 01/14/21 1313    Subjective Has been feeling good with her new HEP- "I can tell I am really working my ankles." Denies questions. Also notes improvement in hip pain.    Pertinent History History of 2 falls with fracture of right wrist 2020, right ankle 2018. Had PT after previous fractures. history of B shoulder pain (now more pain in R shoulder), neck pain with headaches in the past, Hypertension,Hyperthyroidism, osteoporosis, cataracts B    Patient Stated Goals improve stamina/endurance, improve stability and balance, improve strength, decrease pain    Currently in Pain? No/denies    Pain Score 5    Pain Location Knee    Pain Orientation Right    Pain Descriptors / Indicators Aching    Pain Type Chronic pain                              [facility] Adult PT Treatment/Exercise - 01/14/21 0001      Neuro Re-ed    Neuro Re-ed Details  gait + head turns/nods with CGA 4x 26ft   cues for increased L head rotation; 6.5/10 dizziness at height of dizziness     Knee/Hip Exercises: Aerobic   Nustep L4 x 6 min (UEs/LEs)      Knee/Hip Exercises: Standing   Hip Abduction Stengthening;Right;Left;1 set;10 reps;Knee straight    Abduction Limitations yellow TB above knees; counter for support      Ankle Exercises: Standing   Heel Raises 10 reps   chair support; 2x10     Ankle Exercises: Seated   Other Seated Ankle Exercises R/L ankle inversion/eversion with yellow TB 10x   more difficulty/less control with inversion          Vestibular Treatment/Exercise - 01/14/21 0001      Vestibular Treatment/Exercise   Habituation Exercises Comment   sitting leaning R/L to forearm + gaze stabilization 2x10 5-6/10 dizziness                PT Education - 01/14/21 1354    Education Details added to HEP- increased  to yellow band with hip abduction and lean on elbow habituation exercise    Person(s) Educated Patient    Methods Explanation;Demonstration;Tactile cues;Verbal cues;Handout    Comprehension Verbalized understanding;Returned demonstration            PT Short Term Goals - 12/31/20 1150      PT SHORT TERM GOAL #1   Title Pt will be independent with initial HEP    Time 2    Period Weeks    Status Achieved    Target Date 12/16/20             PT Long Term Goals - 12/31/20 1150      PT LONG TERM GOAL #1   Title Pt will score at least 53/56 on berg to demo low fall risk    Time 8    Period Weeks    Status On-going      PT LONG TERM GOAL #2   Title Pt will score at least 19/24 to demo decreased fall risk with community negotiation.    Time 8    Period Weeks    Status On-going      PT LONG TERM GOAL #3   Title Pt will demo improved quality of eye movements without symptoms (no  lightheaded, dizziness, or nausea) with VOR exercises to facilitate improved vestibular function for balance in community    Time 8    Period Weeks    Status On-going      PT LONG TERM GOAL #4   Title Pt will be able to get up and down from the floor safely, with good control and no LOB, to demo improved functional strength    Time 8    Period Weeks    Status On-going      PT LONG TERM GOAL #5   Title Patient to improve ABC score to 90% to decrease risk of falls.    Baseline 86% 12/15/20    Period Weeks    Status On-going                 Plan - 01/14/21 1356    Clinical Impression Statement Patient reporting compliance and understanding with recent HEP. Also noting improvement in hip pain. Worked on increasing challenge with habituation and VOR training today. with patient reporting moderate dizziness with seated habituation, thus cued patient to decrease speed with better tolerance. Worked on gait with VOR training with patient requiring CGA for safety. Better stability with nods vs. head turns. Updated sitting habituation exercises into HEP- patient reported understanding. Proceeded with B ankle strengthening with patient demonstrating slight improvement in ankle stability with heel raises. Still demonstrating limited control with ankle rotation strengthening, particularly into inversion. Patient tolerated session without pain and noted improvement in dizziness upon leaving.    Comorbidities History of 2 falls with fracture of right wrist 2020, right ankle 2018. Had PT after previous fractures. history of B shoulder pain (now more pain in R shoulder), neck pain with headaches in the past, Hypertension,Hyperthyroidism, osteoporosis, cataracts B    PT Treatment/Interventions ADLs/Self Care Home Management;Electrical Stimulation;Moist Heat;Cryotherapy;DME Instruction;Neuromuscular re-education;Balance training;Therapeutic exercise;Therapeutic activities;Functional mobility training;Stair  training;Gait training;Patient/family education;Manual techniques;Energy conservation;Taping;Vestibular    PT Next Visit Plan Progress strengthening, balance, VOR training as tolerated.    Consulted and Agree with Plan of Care Patient           Patient will benefit from skilled therapeutic intervention in order to improve the following deficits and impairments:  Abnormal gait,Decreased range of  motion,Difficulty walking,Increased muscle spasms,Decreased endurance,Dizziness,Pain,Improper body mechanics,Impaired flexibility,Decreased balance,Decreased mobility,Decreased strength  Visit Diagnosis: Unsteadiness on feet  Other abnormalities of gait and mobility  Muscle weakness (generalized)  Abnormal posture  Dizziness and giddiness  History of falling  Chronic right shoulder pain  Chronic low back pain, unspecified back pain laterality, unspecified whether sciatica present     Problem List Patient Active Problem List   Diagnosis Date Noted  . Fall 11/17/2020  . Hyperlipemia 11/07/2020  . Osteoporosis 09/30/2020  . Hyperthyroidism   . Hypertension   . GERD (gastroesophageal reflux disease)   . Gastritis   . Cataracts, bilateral      Janene Harvey, PT, DPT 01/14/21 1:59 PM   [facility] [address]  [place], [place], [address] Phone: [phone]   Fax:  [phone]  Name: Bethany Cole MRN: [id number] Date of Birth: 11/27/1950

## 2021-01-16 ENCOUNTER — Encounter: Payer: Self-pay | Admitting: Nurse Practitioner

## 2021-01-16 ENCOUNTER — Ambulatory Visit: Payer: Medicare Other | Admitting: Nurse Practitioner

## 2021-01-16 ENCOUNTER — Other Ambulatory Visit: Payer: Self-pay

## 2021-01-16 VITALS — BP 130/80 | HR 74 | Ht 63.5 in | Wt 163.0 lb

## 2021-01-16 DIAGNOSIS — R1084 Generalized abdominal pain: Secondary | ICD-10-CM

## 2021-01-16 DIAGNOSIS — R140 Abdominal distension (gaseous): Secondary | ICD-10-CM

## 2021-01-16 DIAGNOSIS — K5909 Other constipation: Secondary | ICD-10-CM | POA: Diagnosis not present

## 2021-01-16 MED ORDER — DICYCLOMINE HCL 10 MG PO CAPS: 10.0000 mg | ORAL_CAPSULE | Freq: Three times a day (TID) | ORAL | 2 refills | Status: DC

## 2021-01-16 MED ORDER — METRONIDAZOLE 250 MG PO TABS: 250.0000 mg | ORAL_TABLET | Freq: Three times a day (TID) | ORAL | 0 refills | Status: AC

## 2021-01-16 NOTE — Patient Instructions (Addendum)
If you are age 71 or older, your body mass index should be between 23-30. Your Body mass index is 28.42 kg/m. If this is out of the aforementioned range listed, please consider follow up with your Primary Care Provider.  If you are age 54 or younger, your body mass index should be between 19-25. Your Body mass index is 28.42 kg/m. If this is out of the aformentioned range listed, please consider follow up with your Primary Care Provider.   We will be sending in Antibiotics to your pharmacy after reviewing your allergy list.  START Bentyl 10 mg 1 tablet before meals and at bedtime as needed.  Take 1 capful of Miralax daily in 8 ounces of water or juice daily.  You have been scheduled to follow up with Dr. Loletha Carrow on Mar 10, 2021 at 10:40 am.  Thank you for entrusting me with your care and choosing [facility].  Tye Savoy, NP-C

## 2021-01-16 NOTE — Progress Notes (Signed)
ASSESSMENT AND PLAN    # 70 yo old female with chronic GERD, recent breakthrough symptoms on high dose PPI. Now asymptomatic after making some dietary changes.  Still taking twice daily PPI but has not needed the Pepcid.  She has a normal EGD April 2021 in [place] for evaluation of GERD symptoms --Continue antireflux measures --Continue twice daily PPI for now.  Hopefully she will be able to reduce dose at some point given symptom improvement with dietary changes.   # Bloating / excessive flatus / generalized abdominal pain.  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 -- Trial of Bentyl 10 mg ac and HS prn for abdominal pain.  Hopefully this will be a short-term medication since it can exacerbate constipation.  -- Treat constipation --Tried at last visit to get her Xifaxan but insurance denied. She is working with [facility] pharmacy but told it may take up to 60 days. Will proceed with course of Flagyl ( Rx probably already at pharmacy as result of message to my nurse yesterday).  --Patient will follow up with Dr. Loletha Carrow in May.  # Chronic constipation ( methane producer). Stools are soft but small volume. --She is drinking a lot of water --Recommend fiber supplement for now as it my exacerbate bloating.  --Miralax 1 capful in 8 oz daily   # Gastric body intestinal metaplasia ( EGD in [place] in 2018).  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 Negative for dysplasia. --At her follow-up visit in May, Dr. Loletha Carrow can decide about need for surveillance EGD with gastric biopsies.      HISTORY OF PRESENT ILLNESS    Chief Complaint : bloating, constipation, abdominal pain.   Shene Maxfield is a 70 y.o. female known to Dr. Loletha Carrow with a past medical history of  hyperthyroidism, hyperlipidemia, OSA with CPAP, fibromyalgia, osteoporosis, arthritis, hysterectomy  Patient established care here to  11/25/2020.  She had been referred by PCP for evaluation of GERD.  She was previously followed by GI in [place], we received the records and made addendum's to her last office note, please refer to that office note.. When I saw the patient in clinic on 11/25/2020 she was having daily GERD symptoms on twice daily PPI.  Pearlee was also complaining of a several month history of bloating and flatulence.  Symptoms were postprandial but sometimes aggravated by stress.  She was avoiding certain trigger foods.  She gave a history of SIBO.  While awaiting GI records from [place] I prescribed a trial of Xifaxan but unfortunately the insurance declined.    INTERVAL HISTORY: Patient is here for follow-up.  Records from GI in [place] confirm she has a history of methane SIBO.  This was successfully treated in 2019 with neomycin and Cipro.  For recurrence of symptoms she was treated with Xifaxan.  Since I saw patient in early February she has continued to have bloating.  She does not have much of an appetite.  She has been having generalized abdominal discomfort and mild constipation.  In a phone note yesterday I recommended a trial of metronidazole for SIBO but patient has not yet gotten the message from the nurse / filled the prescription.  She has been working with [facility] to get Xifaxan but they told her it may be 60 days.   DATA REVIEWED: H.pylori negative in April 2021 per patient Prior Celiac testing negative per patient   PREVIOUS ENDOSCOPIC EVALUATIONS /  PERTINENT STUDIES:   GI Evaluations in [place]  EGD 01/25/2020 for evaluation of heartburn and chest pain.  The esophagus, stomach and examined duodenum were normal  Screening colonoscopy December 2019.  Complete exam.  Good bowel prep. A 3 mm polyp was removed from the sigmoid colon.  Mild melanosis, biopsied.  Multiple large and small mouth diverticula in the sigmoid colon.:  Path compatible with a tubular adenoma, melanosis coli without  pathologic abnormality.  EGD August 2018 for dysphagia and history of gastric body intestinal metaplasia in 2012 in 2016.  A medium nonbleeding diverticulum was found in the area of the papilla.  The entire stomach was normal.  Biopsies were taken for histology.  The esophagus was normal.  Empiric dilation was done with a savory dilator with mild resistance at 17 mm.  Antral biopsies compatible with chronic gastritis, no H. pylori. 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  Negative for dysplasia.    Past Medical History:  Diagnosis Date  . Cataracts, bilateral   . Gastritis   . GERD (gastroesophageal reflux disease)   . Hypertension   . Hyperthyroidism   . OSA on CPAP     Current Medications, Allergies, Past Surgical History, Family History and Social History were reviewed in [employer] record.   Current Outpatient Medications  Medication Sig Dispense Refill  . alendronate (FOSAMAX) 70 MG tablet Take 1 tablet (70 mg total) by mouth every 7 (seven) days. Take with a full glass of water on an empty stomach. 4 tablet 11  . famotidine (PEPCID) 20 MG tablet Take 20 mg by mouth daily.    . irbesartan (AVAPRO) 75 MG tablet Take 1 tablet (75 mg total) by mouth daily. 90 tablet 1  . methimazole (TAPAZOLE) 5 MG tablet Take 0.5 tablets (2.5 mg total) by mouth daily. 45 tablet 1  . pantoprazole (PROTONIX) 40 MG tablet Take 1 tablet (40 mg total) by mouth 2 (two) times daily. 180 tablet 1  . RESTASIS 0.05 % ophthalmic emulsion 1 drop 2 (two) times daily.    . rifaximin (XIFAXAN) 550 MG TABS tablet Take 1 tablet (550 mg total) by mouth 3 (three) times daily. (Patient not taking: Reported on 01/16/2021) 42 tablet 0   No current facility-administered medications for this visit.    Review of Systems: No chest pain. No shortness of breath. No urinary complaints.   PHYSICAL EXAM :    Wt Readings from Last 3 Encounters:   01/16/21 163 lb (73.9 kg)  12/03/20 171 lb (77.6 kg)  11/25/20 172 lb (78 kg)    BP 130/80   Pulse 74   Ht 5' 3.5" (1.613 m)   Wt 163 lb (73.9 kg)   BMI 28.42 kg/m  Constitutional:  Pleasant female in no acute distress. Psychiatric: Normal mood and affect. Behavior is normal. EENT: Pupils normal.  Conjunctivae are normal. No scleral icterus. Neck supple.  Cardiovascular: Normal rate, regular rhythm. No edema Pulmonary/chest: Effort normal and breath sounds normal. No wheezing, rales or rhonchi. Abdominal: Soft, nondistended, nontender. Bowel sounds active throughout. There are no masses palpable. No hepatomegaly. Neurological: Alert and oriented to person place and time. Skin: Skin is warm and dry. No rashes noted.  I spent 30 minutes total reviewing records, obtaining history, performing exam, counseling patient and documenting visit / findings.   Tye Savoy, NP  01/16/2021, 11:22 AM

## 2021-01-20 NOTE — Progress Notes (Signed)
____________________________________________________________  Attending physician addendum:  Thank you for sending this case to me. I have reviewed the entire note and agree with the plan.   Keveon Amsler Danis, MD  ____________________________________________________________  

## 2021-01-27 ENCOUNTER — Telehealth: Payer: Self-pay | Admitting: *Deleted

## 2021-01-27 NOTE — Telephone Encounter (Signed)
Received [employer] SOB for patient's Prolia. Her OOP is $280. I called pt to inform and schedule. No anwer/unable to leave vm. Will try again later.

## 2021-02-10 ENCOUNTER — Ambulatory Visit: Payer: Medicare Other | Attending: Family Medicine | Admitting: Physical Therapy

## 2021-02-10 ENCOUNTER — Other Ambulatory Visit: Payer: Self-pay

## 2021-02-10 ENCOUNTER — Encounter: Payer: Self-pay | Admitting: Physical Therapy

## 2021-02-10 DIAGNOSIS — R42 Dizziness and giddiness: Secondary | ICD-10-CM | POA: Diagnosis not present

## 2021-02-10 DIAGNOSIS — R2681 Unsteadiness on feet: Secondary | ICD-10-CM

## 2021-02-10 DIAGNOSIS — Z9181 History of falling: Secondary | ICD-10-CM | POA: Diagnosis not present

## 2021-02-10 DIAGNOSIS — M25551 Pain in right hip: Secondary | ICD-10-CM | POA: Diagnosis not present

## 2021-02-10 DIAGNOSIS — R2689 Other abnormalities of gait and mobility: Secondary | ICD-10-CM | POA: Diagnosis not present

## 2021-02-10 DIAGNOSIS — R293 Abnormal posture: Secondary | ICD-10-CM

## 2021-02-10 DIAGNOSIS — M6281 Muscle weakness (generalized): Secondary | ICD-10-CM | POA: Diagnosis not present

## 2021-02-10 NOTE — Therapy (Signed)
[name] [address]  [name], [place], [address] Phone: [phone]   Fax:  [phone]  Physical Therapy Treatment  Patient Details  Name: Bethany Cole MRN: [id number] Date of Birth: 1950-12-19 Referring Provider (PT): Rosemarie Ax, MD   Encounter Date: 02/10/2021   PT End of Session - 02/10/21 1532    Visit Number 6    Number of Visits 18    Date for PT Re-Evaluation 03/24/21    Authorization Type [employer] medicare    PT Start Time 5465    PT Stop Time 1527    PT Time Calculation (min) 42 min    Equipment Utilized During Treatment Gait belt    Activity Tolerance Patient tolerated treatment well;Other (comment)   dizziness   Behavior During Therapy WFL for tasks assessed/performed           Past Medical History:  Diagnosis Date  . Cataracts, bilateral   . Gastritis   . GERD (gastroesophageal reflux disease)   . Hypertension   . Hyperthyroidism   . OSA on CPAP     Past Surgical History:  Procedure Laterality Date  . ABDOMINAL HYSTERECTOMY    . ENDOMETRIAL ABLATION    . HERNIA REPAIR    . WRIST ARTHROSCOPY      There were no vitals filed for this visit.   Subjective Assessment - 02/10/21 1446    Subjective Reports that she was on vacation and has not been as consistent with her HEP. Later misplaced her folder, so she would not do her ankle exercises. Still having pain in the R hip, especially when sitting for prolonged periods- pain is located over the anteolateral pain. ALso reports that when looking down at her feet while walking, she reports increased dizziness and imbalance. Does report improved flexibility in her hips.    Pertinent History History of 2 falls with fracture of right wrist 2020, right ankle 2018. Had PT after previous fractures. history of B shoulder pain (now more pain in R shoulder), neck pain with headaches in the past, Hypertension,Hyperthyroidism, osteoporosis,  cataracts B    Patient Stated Goals improve stamina/endurance, improve stability and balance, improve strength, decrease pain    Currently in Pain? No/denies              [facility] PT Assessment - 02/10/21 1454      Assessment   Medical Diagnosis W19.Merril Abbe (ICD-10-CM) - Fall, initial encounter    Referring Provider (PT) Rosemarie Ax, MD      Berg Balance Test   Sit to Stand Able to stand without using hands and stabilize independently    Standing Unsupported Able to stand safely 2 minutes    Sitting with Back Unsupported but Feet Supported on Floor or Stool Able to sit safely and securely 2 minutes    Stand to Sit Sits safely with minimal use of hands    Transfers Able to transfer safely, minor use of hands    Standing Unsupported with Eyes Closed Able to stand 10 seconds safely    Standing Unsupported with Feet Together Able to place feet together independently and stand 1 minute safely    From Standing, Reach Forward with Outstretched Arm Can reach forward >12 cm safely (5")    From Standing Position, Pick up Object from Floor Able to pick up shoe safely and easily   c/o dizziness   From Standing Position, Turn to Look Behind Over each Shoulder Looks behind  from both sides and weight shifts well    Turn 360 Degrees Able to turn 360 degrees safely but slowly   c/o dizziness   Standing Unsupported, Alternately Place Feet on Step/Stool Able to stand independently and complete 8 steps >20 seconds    Standing Unsupported, One Foot in Front Able to place foot tandem independently and hold 30 seconds   sever sway   Standing on One Leg Able to lift leg independently and hold 5-10 seconds    Total Score 51      Dynamic Gait Index   Level Surface Normal    Change in Gait Speed Normal    Gait with Horizontal Head Turns Moderate Impairment    Gait with Vertical Head Turns Moderate Impairment    Gait and Pivot Turn Mild Impairment    Step Over Obstacle Normal    Step Around Obstacles Mild  Impairment    Steps Mild Impairment    Total Score 17                          Vestibular Treatment/Exercise - 02/10/21 0001      X1 Viewing Horizontal   Foot Position standing    Reps 10    Comments 6/10 lightheadedness      X1 Viewing Vertical   Foot Position standing    Reps 10    Comments 7/10 lightheadedness                 PT Education - 02/10/21 1531    Education Details readministered HEP & yellow TB; advised patient to follow up with PCP or ENT about worsening dizziness; discussion on objective progress and remaining impairments    Person(s) Educated Patient    Methods Explanation;Demonstration;Tactile cues;Verbal cues;Handout    Comprehension Returned demonstration;Verbalized understanding            PT Short Term Goals - 02/10/21 1451      PT SHORT TERM GOAL #1   Title Pt will be independent with initial HEP    Time 2    Period Weeks    Status Achieved    Target Date 12/16/20             PT Long Term Goals - 02/10/21 1451      PT LONG TERM GOAL #1   Title Pt will score at least 53/56 on berg to demo low fall risk    Time 6    Period Weeks    Status On-going   51/56   Target Date 03/24/21      PT LONG TERM GOAL #2   Title Pt will score at least 19/24 to demo decreased fall risk with community negotiation.    Time 6    Period Weeks    Status On-going   17/24   Target Date 03/24/21      PT LONG TERM GOAL #3   Title Pt will demo improved quality of eye movements without symptoms (no lightheaded, dizziness, or nausea) with VOR exercises to facilitate improved vestibular function for balance in community    Time 6    Period Weeks    Status On-going   still demonstrating difficultly maintaining fixation with horizontal and vertical VOR   Target Date 03/24/21      PT LONG TERM GOAL #4   Title Pt will be able to get up and down from the floor safely, with good control and no LOB, to demo improved functional strength  Time  8    Period Weeks    Status Achieved   reports tolerance of this activity with/without use of a chair     PT LONG TERM GOAL #5   Title Patient to improve ABC score to 90% to decrease risk of falls.    Baseline 86% 12/15/20    Time 6    Period Weeks    Status On-going   51%   Target Date 03/24/21                 Plan - 02/10/21 1533    Clinical Impression Statement Patient arrived to session with report of decreased consistency with HEP since going on vacation and losing her HEP handout. Still noting anterolateral R hip pain, particularly when standing up from prolonged sitting. Patient also with c/o dizziness and imbalance when looking down while walking. Patient does report tolerance for floor transfers at home with and without a chair for support. Patient's score on berg improved to 51/56, indicating decreased risk of falls. Patient has nearly met her goal on this test. DGI has improved very slightly, however patient still considerably limited in ability to move head and maintain balance. Patient still reporting moderate dizziness with VOR exercises, and dizziness has actually increased since last assessed. Discussed patient's progress and advised patient to follow up with PCP or ENT about worsening dizziness- Patient reported understanding. Patient received print out of previously administered HEP, and reported understanding. Plan to hold off on further vestibular treatment d/t worsening dizziness until further workup. Plan to progress with ankle and hip strengthening to address patient's areas of pain. Patient would benefit from additional skilled PT services 2x/week for 6 weeks to address remaining goals.    Comorbidities History of 2 falls with fracture of right wrist 2020, right ankle 2018. Had PT after previous fractures. history of B shoulder pain (now more pain in R shoulder), neck pain with headaches in the past, Hypertension,Hyperthyroidism, osteoporosis, cataracts B    PT  Frequency 2x / week    PT Duration 6 weeks    PT Treatment/Interventions ADLs/Self Care Home Management;Electrical Stimulation;Moist Heat;Cryotherapy;DME Instruction;Neuromuscular re-education;Balance training;Therapeutic exercise;Therapeutic activities;Functional mobility training;Stair training;Gait training;Patient/family education;Manual techniques;Energy conservation;Taping;Vestibular;Iontophoresis 93m/ml Dexamethasone;Ultrasound;Vasopneumatic Device;Dry needling;Passive range of motion    PT Next Visit Plan Progress ankle and hip strengthening and stretching, balance    Consulted and Agree with Plan of Care Patient           Patient will benefit from skilled therapeutic intervention in order to improve the following deficits and impairments:  Abnormal gait,Decreased range of motion,Difficulty walking,Increased muscle spasms,Decreased endurance,Dizziness,Pain,Improper body mechanics,Impaired flexibility,Decreased balance,Decreased mobility,Decreased strength,Increased fascial restricitons,Postural dysfunction  Visit Diagnosis: Unsteadiness on feet  Muscle weakness (generalized)  Pain in right hip  Other abnormalities of gait and mobility  Abnormal posture  Dizziness and giddiness  History of falling     Problem List Patient Active Problem List   Diagnosis Date Noted  . Fall 11/17/2020  . Hyperlipemia 11/07/2020  . Osteoporosis 09/30/2020  . Hyperthyroidism   . Hypertension   . GERD (gastroesophageal reflux disease)   . Gastritis   . Cataracts, bilateral     YJanene Harvey PT, DPT 02/10/21 6:09 PM   [facility] [address] [place] [address]: [phone]  Fax:  [phone] Name: EVirlee StroscheinMRN: [id number] of Birth: [birth year]

## 2021-02-12 ENCOUNTER — Encounter: Payer: Self-pay | Admitting: Physician Assistant

## 2021-02-12 ENCOUNTER — Other Ambulatory Visit: Payer: Self-pay

## 2021-02-12 ENCOUNTER — Ambulatory Visit: Payer: Medicare Other | Admitting: Physician Assistant

## 2021-02-12 DIAGNOSIS — Z85828 Personal history of other malignant neoplasm of skin: Secondary | ICD-10-CM | POA: Diagnosis not present

## 2021-02-12 DIAGNOSIS — L570 Actinic keratosis: Secondary | ICD-10-CM

## 2021-02-12 DIAGNOSIS — Z8589 Personal history of malignant neoplasm of other organs and systems: Secondary | ICD-10-CM

## 2021-02-16 ENCOUNTER — Encounter: Payer: Self-pay | Admitting: Physical Therapy

## 2021-02-16 ENCOUNTER — Ambulatory Visit: Payer: Medicare Other | Admitting: Physical Therapy

## 2021-02-16 ENCOUNTER — Other Ambulatory Visit: Payer: Self-pay

## 2021-02-16 DIAGNOSIS — R293 Abnormal posture: Secondary | ICD-10-CM | POA: Diagnosis not present

## 2021-02-16 DIAGNOSIS — Z9181 History of falling: Secondary | ICD-10-CM

## 2021-02-16 DIAGNOSIS — M6281 Muscle weakness (generalized): Secondary | ICD-10-CM | POA: Diagnosis not present

## 2021-02-16 DIAGNOSIS — R2681 Unsteadiness on feet: Secondary | ICD-10-CM

## 2021-02-16 DIAGNOSIS — M25551 Pain in right hip: Secondary | ICD-10-CM

## 2021-02-16 DIAGNOSIS — R42 Dizziness and giddiness: Secondary | ICD-10-CM | POA: Diagnosis not present

## 2021-02-16 DIAGNOSIS — R2689 Other abnormalities of gait and mobility: Secondary | ICD-10-CM

## 2021-02-16 NOTE — Therapy (Signed)
[facility] [address]  [place], [place], [address] Phone: [phone]   Fax:  [phone]  Physical Therapy Treatment  Patient Details  Name: Bethany Cole MRN: [id number] Date of Birth: December 20, 1950 Referring Provider (PT): Rosemarie Ax, MD   Encounter Date: 02/16/2021   PT End of Session - 02/16/21 1148    Visit Number 7    Number of Visits 18    Date for PT Re-Evaluation 03/24/21    Authorization Type [employer]    PT Start Time 1059    PT Stop Time 1143    PT Time Calculation (min) 44 min    Activity Tolerance Patient tolerated treatment well    Behavior During Therapy [facility] for tasks assessed/performed           Past Medical History:  Diagnosis Date  . Cataracts, bilateral   . Gastritis   . GERD (gastroesophageal reflux disease)   . Hypertension   . Hyperthyroidism   . OSA on CPAP   . Squamous cell carcinoma of skin     Past Surgical History:  Procedure Laterality Date  . ABDOMINAL HYSTERECTOMY    . ENDOMETRIAL ABLATION    . HERNIA REPAIR    . WRIST ARTHROSCOPY      There were no vitals filed for this visit.   Subjective Assessment - 02/16/21 1100    Subjective Has been feeling pretty good since last session.    Pertinent History History of 2 falls with fracture of right wrist 2020, right ankle 2018. Had PT after previous fractures. history of B shoulder pain (now more pain in R shoulder), neck pain with headaches in the past, Hypertension,Hyperthyroidism, osteoporosis, cataracts B    Patient Stated Goals improve stamina/endurance, improve stability and balance, improve strength, decrease pain    Currently in Pain? No/denies                             [facility] Adult PT Treatment/Exercise - 02/16/21 1101      Knee/Hip Exercises: Aerobic   Nustep L4 x 6 min (UEs/LEs)      Knee/Hip Exercises: Seated   Sit to Sand without UE support;1 set   red TB above knees;  mirror feedback     Ankle Exercises: Standing   Heel Raises 10 reps   10x knees extended, 10x knees bent; 5x each B conc, single eccentric   Heel Walk (Round Trip) --   4x length of counter with minor UE support   Toe Walk (Round Trip) --   4x length of counter with minor UE support     Ankle Exercises: Stretches   Gastroc Stretch 1 rep;30 seconds   runner's stretch; cues to correct toe out     Ankle Exercises: Seated   Other Seated Ankle Exercises R/L ankle inversion/eversion with red TB 10x   cues to avoid hip compensation with good carryover              Balance Exercises - 02/16/21 0001      Balance Exercises: Standing   Standing Eyes Opened Narrow base of support (BOS);Foam/compliant surface;30 secs;2 reps   cues to correct L lateral lean   Standing Eyes Closed 2 reps;Foam/compliant surface;30 secs   more anterior sway and cues to contract core            PT Education - 02/16/21 1148    Education Details update to  HEP- administered 2 red TBs    Person(s) Educated Patient    Methods Explanation;Demonstration;Tactile cues;Verbal cues;Handout    Comprehension Verbalized understanding;Returned demonstration            PT Short Term Goals - 02/10/21 1451      PT SHORT TERM GOAL #1   Title Pt will be independent with initial HEP    Time 2    Period Weeks    Status Achieved    Target Date 12/16/20             PT Long Term Goals - 02/10/21 1451      PT LONG TERM GOAL #1   Title Pt will score at least 53/56 on berg to demo low fall risk    Time 6    Period Weeks    Status On-going   51/56   Target Date 03/24/21      PT LONG TERM GOAL #2   Title Pt will score at least 19/24 to demo decreased fall risk with community negotiation.    Time 6    Period Weeks    Status On-going   17/24   Target Date 03/24/21      PT LONG TERM GOAL #3   Title Pt will demo improved quality of eye movements without symptoms (no lightheaded, dizziness, or nausea) with VOR  exercises to facilitate improved vestibular function for balance in community    Time 6    Period Weeks    Status On-going   still demonstrating difficultly maintaining fixation with horizontal and vertical VOR   Target Date 03/24/21      PT LONG TERM GOAL #4   Title Pt will be able to get up and down from the floor safely, with good control and no LOB, to demo improved functional strength    Time 8    Period Weeks    Status Achieved   reports tolerance of this activity with/without use of a chair     PT LONG TERM GOAL #5   Title Patient to improve ABC score to 90% to decrease risk of falls.    Baseline 86% 12/15/20    Time 6    Period Weeks    Status On-going   51%   Target Date 03/24/21                 Plan - 02/16/21 1149    Clinical Impression Statement Patient arrived to session without new complaints. Worked on progressing ankle strength and stability with more challenging standing ther-ex and balance work today. Patient with improved ankle stability with heel raises and able to perform more challenging heel raises today. Able to perform heel and toe walking with minimal UE support, however demonstrating decreased stance time and decreased amplitude on the R foot. Patient with L lateral lean present with static balance, with good subjective report in stability after working to correct this. Still demonstrates valgus collapse of R knee with STS transfers, but after cueing and mirror feedback patient was able to considerably improve on this. Updated HEP with several exercises that were well-tolerated today. Patient reported understanding and without complaints at end of session.    Comorbidities History of 2 falls with fracture of right wrist 2020, right ankle 2018. Had PT after previous fractures. history of B shoulder pain (now more pain in R shoulder), neck pain with headaches in the past, Hypertension,Hyperthyroidism, osteoporosis, cataracts B    PT Frequency 2x / week    PT  Duration 6 weeks    PT Treatment/Interventions ADLs/Self Care Home Management;Electrical Stimulation;Moist Heat;Cryotherapy;DME Instruction;Neuromuscular re-education;Balance training;Therapeutic exercise;Therapeutic activities;Functional mobility training;Stair training;Gait training;Patient/family education;Manual techniques;Energy conservation;Taping;Vestibular;Iontophoresis 4mg /ml Dexamethasone;Ultrasound;Vasopneumatic Device;Dry needling;Passive range of motion    PT Next Visit Plan Progress ankle and hip strengthening and stretching, balance    Consulted and Agree with Plan of Care Patient           Patient will benefit from skilled therapeutic intervention in order to improve the following deficits and impairments:  Abnormal gait,Decreased range of motion,Difficulty walking,Increased muscle spasms,Decreased endurance,Dizziness,Pain,Improper body mechanics,Impaired flexibility,Decreased balance,Decreased mobility,Decreased strength,Increased fascial restricitons,Postural dysfunction  Visit Diagnosis: Unsteadiness on feet  Muscle weakness (generalized)  Pain in right hip  Other abnormalities of gait and mobility  Abnormal posture  Dizziness and giddiness  History of falling     Problem List Patient Active Problem List   Diagnosis Date Noted  . Fall 11/17/2020  . Hyperlipemia 11/07/2020  . Osteoporosis 09/30/2020  . Hyperthyroidism   . Hypertension   . GERD (gastroesophageal reflux disease)   . Gastritis   . Cataracts, bilateral      Janene Harvey, PT, DPT 02/16/21 11:54 AM   [facility] [address]  [place], [place], [address] Phone: [phone]   Fax:  [phone]  Name: Bethany Cole MRN: [id number] Date of Birth: [birth year]

## 2021-02-18 ENCOUNTER — Ambulatory Visit: Payer: Medicare Other

## 2021-02-18 ENCOUNTER — Other Ambulatory Visit: Payer: Self-pay

## 2021-02-18 DIAGNOSIS — R2689 Other abnormalities of gait and mobility: Secondary | ICD-10-CM | POA: Diagnosis not present

## 2021-02-18 DIAGNOSIS — R293 Abnormal posture: Secondary | ICD-10-CM | POA: Diagnosis not present

## 2021-02-18 DIAGNOSIS — M6281 Muscle weakness (generalized): Secondary | ICD-10-CM

## 2021-02-18 DIAGNOSIS — Z9181 History of falling: Secondary | ICD-10-CM | POA: Diagnosis not present

## 2021-02-18 DIAGNOSIS — M25551 Pain in right hip: Secondary | ICD-10-CM

## 2021-02-18 DIAGNOSIS — R2681 Unsteadiness on feet: Secondary | ICD-10-CM

## 2021-02-18 DIAGNOSIS — R42 Dizziness and giddiness: Secondary | ICD-10-CM | POA: Diagnosis not present

## 2021-02-18 NOTE — Therapy (Signed)
[facility] [address]  [place], [place], [address] Phone: [phone]   Fax:  [phone]  Physical Therapy Treatment  Patient Details  Name: Bethany Cole MRN: [id number] Date of Birth: 08-28-1951 Referring Provider (PT): Rosemarie Ax, MD   Encounter Date: 02/18/2021   PT End of Session - 02/18/21 1146    Visit Number 8    Number of Visits 18    Date for PT Re-Evaluation 03/24/21    Authorization Type [employer]    PT Start Time 1103    PT Stop Time 1143    PT Time Calculation (min) 40 min    Activity Tolerance Patient tolerated treatment well    Behavior During Therapy [facility] for tasks assessed/performed           Past Medical History:  Diagnosis Date  . Cataracts, bilateral   . Gastritis   . GERD (gastroesophageal reflux disease)   . Hypertension   . Hyperthyroidism   . OSA on CPAP   . Squamous cell carcinoma of skin     Past Surgical History:  Procedure Laterality Date  . ABDOMINAL HYSTERECTOMY    . ENDOMETRIAL ABLATION    . HERNIA REPAIR    . WRIST ARTHROSCOPY      There were no vitals filed for this visit.   Subjective Assessment - 02/18/21 1111    Subjective Pt is doing well.    Pertinent History History of 2 falls with fracture of right wrist 2020, right ankle 2018. Had PT after previous fractures. history of B shoulder pain (now more pain in R shoulder), neck pain with headaches in the past, Hypertension,Hyperthyroidism, osteoporosis, cataracts B    Patient Stated Goals improve stamina/endurance, improve stability and balance, improve strength, decrease pain    Currently in Pain? No/denies                             [facility] Adult PT Treatment/Exercise - 02/18/21 0001      Knee/Hip Exercises: Aerobic   Recumbent Bike L2x1min      Knee/Hip Exercises: Seated   Clamshell with TheraBand Red   10 reps   Marching Strengthening;Both;1 set;10 reps    Marching  Limitations R TB      Ankle Exercises: Seated   Other Seated Ankle Exercises R ankle eversion 2x10 R Tband               Balance Exercises - 02/18/21 0001      Balance Exercises: Standing   Standing Eyes Opened Narrow base of support (BOS);Foam/compliant surface   head turns: trunk rotation 5x   Standing Eyes Closed Narrow base of support (BOS);Foam/compliant surface;3 reps;20 secs    Marching Foam/compliant surface;10 reps    Heel Raises Both;10 reps    Toe Raise Both;10 reps               PT Short Term Goals - 02/10/21 1451      PT SHORT TERM GOAL #1   Title Pt will be independent with initial HEP    Time 2    Period Weeks    Status Achieved    Target Date 12/16/20             PT Long Term Goals - 02/10/21 1451      PT LONG TERM GOAL #1   Title Pt will score at least 53/56 on berg to demo low  fall risk    Time 6    Period Weeks    Status On-going   51/56   Target Date 03/24/21      PT LONG TERM GOAL #2   Title Pt will score at least 19/24 to demo decreased fall risk with community negotiation.    Time 6    Period Weeks    Status On-going   17/24   Target Date 03/24/21      PT LONG TERM GOAL #3   Title Pt will demo improved quality of eye movements without symptoms (no lightheaded, dizziness, or nausea) with VOR exercises to facilitate improved vestibular function for balance in community    Time 6    Period Weeks    Status On-going   still demonstrating difficultly maintaining fixation with horizontal and vertical VOR   Target Date 03/24/21      PT LONG TERM GOAL #4   Title Pt will be able to get up and down from the floor safely, with good control and no LOB, to demo improved functional strength    Time 8    Period Weeks    Status Achieved   reports tolerance of this activity with/without use of a chair     PT LONG TERM GOAL #5   Title Patient to improve ABC score to 90% to decrease risk of falls.    Baseline 86% 12/15/20    Time 6     Period Weeks    Status On-going   51%   Target Date 03/24/21                 Plan - 02/18/21 1147    Clinical Impression Statement Pt responded well to treatment. No reports of pain during session. Progressed static balance exercises incorporating head turns and trunk rotations. Had mild c/o lightheadedness with head turns but subsided with standing rest breaks. She showed decreased stability with posterior and R WS during balance. Also did hip strengthening to increase proximal stability with balance and ankles strategies. She required cueing to correctly target the proper muscles and to keep weight shifted through feet to improve recovery.    Personal Factors and Comorbidities Comorbidity 2    Comorbidities History of 2 falls with fracture of right wrist 2020, right ankle 2018. Had PT after previous fractures. history of B shoulder pain (now more pain in R shoulder), neck pain with headaches in the past, Hypertension,Hyperthyroidism, osteoporosis, cataracts B    PT Frequency 2x / week    PT Duration 6 weeks    PT Treatment/Interventions ADLs/Self Care Home Management;Electrical Stimulation;Moist Heat;Cryotherapy;DME Instruction;Neuromuscular re-education;Balance training;Therapeutic exercise;Therapeutic activities;Functional mobility training;Stair training;Gait training;Patient/family education;Manual techniques;Energy conservation;Taping;Vestibular;Iontophoresis 4mg /ml Dexamethasone;Ultrasound;Vasopneumatic Device;Dry needling;Passive range of motion    PT Next Visit Plan Progress ankle and hip strengthening and stretching, balance    PT Home Exercise Plan See pt instructions    Consulted and Agree with Plan of Care Patient           Patient will benefit from skilled therapeutic intervention in order to improve the following deficits and impairments:  Abnormal gait,Decreased range of motion,Difficulty walking,Increased muscle spasms,Decreased endurance,Dizziness,Pain,Improper body  mechanics,Impaired flexibility,Decreased balance,Decreased mobility,Decreased strength,Increased fascial restricitons,Postural dysfunction  Visit Diagnosis: Unsteadiness on feet  Muscle weakness (generalized)  Pain in right hip  Other abnormalities of gait and mobility  Abnormal posture  Dizziness and giddiness  History of falling     Problem List Patient Active Problem List   Diagnosis Date Noted  . Fall 11/17/2020  .  Hyperlipemia 11/07/2020  . Osteoporosis 09/30/2020  . Hyperthyroidism   . Hypertension   . GERD (gastroesophageal reflux disease)   . Gastritis   . Cataracts, bilateral     Artist Pais, PTA 02/18/2021, 11:59 AM  [facility] [address]  [place], [place], [address] Phone: [phone]   Fax:  [phone]  Name: Daneka Lantigua MRN: [id number] Date of Birth: [birth year]

## 2021-02-23 ENCOUNTER — Encounter: Payer: Medicare Other | Admitting: Physical Therapy

## 2021-02-25 ENCOUNTER — Other Ambulatory Visit: Payer: Self-pay

## 2021-02-25 ENCOUNTER — Encounter: Payer: Self-pay | Admitting: Physical Therapy

## 2021-02-25 ENCOUNTER — Ambulatory Visit: Payer: Medicare Other | Attending: Family Medicine | Admitting: Physical Therapy

## 2021-02-25 DIAGNOSIS — R2681 Unsteadiness on feet: Secondary | ICD-10-CM | POA: Diagnosis not present

## 2021-02-25 DIAGNOSIS — R42 Dizziness and giddiness: Secondary | ICD-10-CM | POA: Diagnosis not present

## 2021-02-25 DIAGNOSIS — R293 Abnormal posture: Secondary | ICD-10-CM | POA: Diagnosis not present

## 2021-02-25 DIAGNOSIS — M6281 Muscle weakness (generalized): Secondary | ICD-10-CM | POA: Diagnosis not present

## 2021-02-25 DIAGNOSIS — M25551 Pain in right hip: Secondary | ICD-10-CM | POA: Insufficient documentation

## 2021-02-25 DIAGNOSIS — Z9181 History of falling: Secondary | ICD-10-CM | POA: Diagnosis not present

## 2021-02-25 DIAGNOSIS — R2689 Other abnormalities of gait and mobility: Secondary | ICD-10-CM | POA: Insufficient documentation

## 2021-02-25 NOTE — Therapy (Signed)
[facility] [address]  [place], [place], [address] Phone: [phone]   Fax:  [phone]  Physical Therapy Treatment  Patient Details  Name: Bethany Cole MRN: [id number] Date of Birth: [birth year] Referring Provider (PT): Rosemarie Ax, MD   Encounter Date: 02/25/2021   PT End of Session - 02/25/21 1150    Visit Number 9    Number of Visits 18    Date for PT Re-Evaluation 03/24/21    Authorization Type [employer] medicare    PT Start Time 1100    PT Stop Time 1147    PT Time Calculation (min) 47 min    Equipment Utilized During Treatment Gait belt    Activity Tolerance Patient tolerated treatment well    Behavior During Therapy [facility] for tasks assessed/performed           Past Medical History:  Diagnosis Date  . Cataracts, bilateral   . Gastritis   . GERD (gastroesophageal reflux disease)   . Hypertension   . Hyperthyroidism   . OSA on CPAP   . Squamous cell carcinoma of skin     Past Surgical History:  Procedure Laterality Date  . ABDOMINAL HYSTERECTOMY    . ENDOMETRIAL ABLATION    . HERNIA REPAIR    . WRIST ARTHROSCOPY      There were no vitals filed for this visit.   Subjective Assessment - 02/25/21 1100    Subjective Doing good. Plans to discuss her vertigo with her PCP on friday.    Pertinent History History of 2 falls with fracture of right wrist 2020, right ankle 2018. Had PT after previous fractures. history of B shoulder pain (now more pain in R shoulder), neck pain with headaches in the past, Hypertension,Hyperthyroidism, osteoporosis, cataracts B    Patient Stated Goals improve stamina/endurance, improve stability and balance, improve strength, decrease pain    Currently in Pain? No/denies                             [facility] Adult PT Treatment/Exercise - 02/25/21 0001      Ambulation/Gait   Gait Comments gait training with 2 walking poles using 4 pt gait pattern  x40ft   cueing for AD sequencing     Knee/Hip Exercises: Aerobic   Nustep L5 x 6 min (UEs/LEs)      Knee/Hip Exercises: Standing   Hip ADduction Strengthening;Right;Left;1 set;10 reps    Hip ADduction Limitations red TB around toes; chair for support    Hip Abduction Stengthening;Right;Left;1 set;10 reps;Knee straight    Abduction Limitations red TB around toes; chair for support    Other Standing Knee Exercises sidestepping with red loop around ankles 4x length of counter      Ankle Exercises: Standing   Heel Raises Right;Left;10 reps   2x10 single leg heel raise; cues to maintain TKE     Ankle Exercises: Stretches   Gastroc Stretch 2 reps;30 seconds   toes on 4" step              Balance Exercises - 02/25/21 0001      Balance Exercises: Standing   Other Standing Exercises Comments R/L 3 toe tap on cone without UE support 10x each   slight ankle and knee instability            PT Education - 02/25/21 1149    Education Details update to HEP; discussion on benefits  of generalized fitness regimen to improve LE strength and use of walking sticks to improve balance confidence outside    Person(s) Educated Patient    Methods Explanation;Demonstration;Tactile cues;Verbal cues;Handout    Comprehension Verbalized understanding;Returned demonstration            PT Short Term Goals - 02/10/21 1451      PT SHORT TERM GOAL #1   Title Pt will be independent with initial HEP    Time 2    Period Weeks    Status Achieved    Target Date 12/16/20             PT Long Term Goals - 02/10/21 1451      PT LONG TERM GOAL #1   Title Pt will score at least 53/56 on berg to demo low fall risk    Time 6    Period Weeks    Status On-going   51/56   Target Date 03/24/21      PT LONG TERM GOAL #2   Title Pt will score at least 19/24 to demo decreased fall risk with community negotiation.    Time 6    Period Weeks    Status On-going   17/24   Target Date 03/24/21      PT  LONG TERM GOAL #3   Title Pt will demo improved quality of eye movements without symptoms (no lightheaded, dizziness, or nausea) with VOR exercises to facilitate improved vestibular function for balance in community    Time 6    Period Weeks    Status On-going   still demonstrating difficultly maintaining fixation with horizontal and vertical VOR   Target Date 03/24/21      PT LONG TERM GOAL #4   Title Pt will be able to get up and down from the floor safely, with good control and no LOB, to demo improved functional strength    Time 8    Period Weeks    Status Achieved   reports tolerance of this activity with/without use of a chair     PT LONG TERM GOAL #5   Title Patient to improve ABC score to 90% to decrease risk of falls.    Baseline 86% 12/15/20    Time 6    Period Weeks    Status On-going   51%   Target Date 03/24/21                 Plan - 02/25/21 1151    Clinical Impression Statement Patient without new complaints today. Increased challenge with heel raises with patient demonstrating fairly good ROM, but with considerable B knee instability, requiring cues to contract quad and maintain TKE. Also intensified gastroc stretching with patient tolerating this well. Worked on hip strengthening ther-ex with patient remarking on muscle burn and challenge but without c/o pain. Encouraged patient to slowly start working towards a generalized fitness program for continued strengthening as patient's balance deficits are partly d/t weakness. Patient agreeable to try [employer] online classes. Also trialed use of walking sticks with cueing for proper AD sequencing to increase patient's balance confidence while walking outside, as patient admits to decreased activity and balance confidence during the pandemic. Patient reported understanding of all edu provided today and without complaints at end of session.    Personal Factors and Comorbidities Comorbidity 2    Comorbidities History  of 2 falls with fracture of right wrist 2020, right ankle 2018. Had PT after previous fractures. history of B shoulder  pain (now more pain in R shoulder), neck pain with headaches in the past, Hypertension,Hyperthyroidism, osteoporosis, cataracts B    PT Frequency 2x / week    PT Duration 6 weeks    PT Treatment/Interventions ADLs/Self Care Home Management;Electrical Stimulation;Moist Heat;Cryotherapy;DME Instruction;Neuromuscular re-education;Balance training;Therapeutic exercise;Therapeutic activities;Functional mobility training;Stair training;Gait training;Patient/family education;Manual techniques;Energy conservation;Taping;Vestibular;Iontophoresis 4mg /ml Dexamethasone;Ultrasound;Vasopneumatic Device;Dry needling;Passive range of motion    PT Next Visit Plan Progress ankle and hip strengthening and stretching, balance    PT Home Exercise Plan See pt instructions    Consulted and Agree with Plan of Care Patient           Patient will benefit from skilled therapeutic intervention in order to improve the following deficits and impairments:  Abnormal gait,Decreased range of motion,Difficulty walking,Increased muscle spasms,Decreased endurance,Dizziness,Pain,Improper body mechanics,Impaired flexibility,Decreased balance,Decreased mobility,Decreased strength,Increased fascial restricitons,Postural dysfunction  Visit Diagnosis: Unsteadiness on feet  Muscle weakness (generalized)  Pain in right hip  Other abnormalities of gait and mobility  Abnormal posture  Dizziness and giddiness  History of falling     Problem List Patient Active Problem List   Diagnosis Date Noted  . Fall 11/17/2020  . Hyperlipemia 11/07/2020  . Osteoporosis 09/30/2020  . Hyperthyroidism   . Hypertension   . GERD (gastroesophageal reflux disease)   . Gastritis   . Cataracts, bilateral      Janene Harvey, PT, DPT 02/25/21 11:54 AM   [facility] [address]  [place], [place], [address] Phone: [phone]   Fax:  [phone]  Name: Bethany Cole MRN: [id number] Date of Birth: Aug 22, 1951

## 2021-02-27 ENCOUNTER — Other Ambulatory Visit: Payer: Self-pay

## 2021-02-27 ENCOUNTER — Ambulatory Visit (INDEPENDENT_AMBULATORY_CARE_PROVIDER_SITE_OTHER): Payer: Medicare Other | Admitting: Internal Medicine

## 2021-02-27 ENCOUNTER — Encounter: Payer: Self-pay | Admitting: Internal Medicine

## 2021-02-27 VITALS — BP 110/70 | HR 67 | Temp 97.9°F | Wt 163.1 lb

## 2021-02-27 DIAGNOSIS — H9190 Unspecified hearing loss, unspecified ear: Secondary | ICD-10-CM

## 2021-02-27 DIAGNOSIS — F419 Anxiety disorder, unspecified: Secondary | ICD-10-CM | POA: Diagnosis not present

## 2021-02-27 DIAGNOSIS — G4733 Obstructive sleep apnea (adult) (pediatric): Secondary | ICD-10-CM | POA: Diagnosis not present

## 2021-02-27 DIAGNOSIS — M25551 Pain in right hip: Secondary | ICD-10-CM

## 2021-02-27 DIAGNOSIS — Z9989 Dependence on other enabling machines and devices: Secondary | ICD-10-CM | POA: Diagnosis not present

## 2021-02-27 NOTE — Progress Notes (Signed)
Acute office Visit     This visit occurred during the SARS-CoV-2 public health emergency.  Safety protocols were in place, including screening questions prior to the visit, additional usage of staff PPE, and extensive cleaning of exam room while observing appropriate contact time as indicated for disinfecting solutions.    CC/Reason for Visit: Discuss some acute concerns  HPI: Bethany Cole is a 70 y.o. female who is coming in today for the above mentioned reasons.  She is here today to discuss some acute issues:  1.  The main thing is right hip pain.  This has been present for about 2 months.  It is located on the lateral aspect of her hip right over the greater trochanter.  It is worse when she lies on her side at nighttime and finds her self constantly shifting positions.  When it is really aching it causes a limp.  She has had no trauma that she can recall, no falls.  2.  Ever since moving to [place] she has noticed significant anxiety with driving especially on highways.  She is requesting referral to psychotherapy.  3.  She has OSA on CPAP, she is running low on her CPAP supplies.  4.  She would like a referral to ENT and audiology due to her bilateral hearing loss.  She does wear hearing aids.  Past Medical/Surgical History: Past Medical History:  Diagnosis Date  . Cataracts, bilateral   . Gastritis   . GERD (gastroesophageal reflux disease)   . Hypertension   . Hyperthyroidism   . OSA on CPAP   . Squamous cell carcinoma of skin     Past Surgical History:  Procedure Laterality Date  . ABDOMINAL HYSTERECTOMY    . ENDOMETRIAL ABLATION    . HERNIA REPAIR    . WRIST ARTHROSCOPY      Social History:  reports that she has never smoked. She has never used smokeless tobacco. She reports current alcohol use. She reports that she does not use drugs.  Allergies: Allergies  Allergen Reactions  . Erythromycin   . Macrobid [Nitrofurantoin]   . Penicillins   .  Sulfa Antibiotics     Family History:  Family History  Problem Relation Age of Onset  . Ovarian cancer Mother   . Heart disease Father      Current Outpatient Medications:  .  alendronate (FOSAMAX) 70 MG tablet, Take 1 tablet (70 mg total) by mouth every 7 (seven) days. Take with a full glass of water on an empty stomach., Disp: 4 tablet, Rfl: 11 .  dicyclomine (BENTYL) 10 MG capsule, Take 1 capsule (10 mg total) by mouth 4 (four) times daily -  before meals and at bedtime., Disp: 40 capsule, Rfl: 2 .  famotidine (PEPCID) 20 MG tablet, Take 20 mg by mouth daily., Disp: , Rfl:  .  irbesartan (AVAPRO) 75 MG tablet, Take 1 tablet (75 mg total) by mouth daily., Disp: 90 tablet, Rfl: 1 .  methimazole (TAPAZOLE) 5 MG tablet, Take 0.5 tablets (2.5 mg total) by mouth daily., Disp: 45 tablet, Rfl: 1 .  pantoprazole (PROTONIX) 40 MG tablet, Take 1 tablet (40 mg total) by mouth 2 (two) times daily., Disp: 180 tablet, Rfl: 1 .  RESTASIS 0.05 % ophthalmic emulsion, 1 drop 2 (two) times daily., Disp: , Rfl:  .  rifaximin (XIFAXAN) 550 MG TABS tablet, Take 1 tablet (550 mg total) by mouth 3 (three) times daily., Disp: 42 tablet, Rfl: 0  Review of Systems:  Constitutional: Denies fever, chills, diaphoresis, appetite change and fatigue.  HEENT: Denies photophobia, eye pain, redness, ear pain, congestion, sore throat, rhinorrhea, sneezing, mouth sores, trouble swallowing, neck pain, neck stiffness and tinnitus.   Respiratory: Denies SOB, DOE, cough, chest tightness,  and wheezing.   Cardiovascular: Denies chest pain, palpitations and leg swelling.  Gastrointestinal: Denies nausea, vomiting, abdominal pain, diarrhea, constipation, blood in stool and abdominal distention.  Genitourinary: Denies dysuria, urgency, frequency, hematuria, flank pain and difficulty urinating.  Endocrine: Denies: hot or cold intolerance, sweats, changes in hair or nails, polyuria, polydipsia. Musculoskeletal: Denies myalgias, back  pain, joint swelling.  Skin: Denies pallor, rash and wound.  Neurological: Denies dizziness, seizures, syncope, weakness, light-headedness, numbness and headaches.  Hematological: Denies adenopathy. Easy bruising, personal or family bleeding history  Psychiatric/Behavioral: Denies suicidal ideation, mood changes, confusion, nervousness, sleep disturbance and agitation    Physical Exam: Vitals:   02/27/21 0711  BP: 110/70  Pulse: 67  Temp: 97.9 F (36.6 C)  TempSrc: Oral  SpO2: 98%  Weight: 163 lb 1.6 oz (74 kg)    Body mass index is 28.44 kg/m.   Constitutional: NAD, calm, comfortable Eyes: PERRL, lids and conjunctivae normal, wears corrective lenses ENMT: Mucous membranes are moist.   Musculoskeletal: Pain to palpation to the lateral aspect of the hip.  No pain with flexion of the hip or external rotation. Neurologic: Grossly intact and nonfocal Psychiatric: Normal judgment and insight. Alert and oriented x 3. Normal mood.    Impression and Plan:  Right hip pain  -Sounds like possibly trochanteric bursitis. -Have advised icing, NSAIDs, consideration to steroid injection if no improvement. - Plan: Ambulatory referral to Sports Medicine  OSA on CPAP  - Plan: Ambulatory referral to Pulmonology  Hearing loss, unspecified hearing loss type, unspecified laterality  - Plan: Ambulatory referral to ENT  Anxiety -I have given her information on how to schedule CBT sessions.    Lelon Frohlich, MD [facility] at [facility]

## 2021-03-02 ENCOUNTER — Encounter: Payer: Medicare Other | Admitting: Rehabilitative and Restorative Service Providers"

## 2021-03-03 ENCOUNTER — Other Ambulatory Visit: Payer: Self-pay

## 2021-03-03 ENCOUNTER — Ambulatory Visit: Payer: Medicare Other

## 2021-03-03 DIAGNOSIS — R293 Abnormal posture: Secondary | ICD-10-CM

## 2021-03-03 DIAGNOSIS — R2689 Other abnormalities of gait and mobility: Secondary | ICD-10-CM | POA: Diagnosis not present

## 2021-03-03 DIAGNOSIS — M25551 Pain in right hip: Secondary | ICD-10-CM | POA: Diagnosis not present

## 2021-03-03 DIAGNOSIS — R2681 Unsteadiness on feet: Secondary | ICD-10-CM | POA: Diagnosis not present

## 2021-03-03 DIAGNOSIS — M6281 Muscle weakness (generalized): Secondary | ICD-10-CM

## 2021-03-03 DIAGNOSIS — R42 Dizziness and giddiness: Secondary | ICD-10-CM

## 2021-03-03 DIAGNOSIS — Z9181 History of falling: Secondary | ICD-10-CM | POA: Diagnosis not present

## 2021-03-03 NOTE — Therapy (Signed)
[facility] [address]  [place], [place], [address] Phone: [phone]   Fax:  [phone]  Physical Therapy Treatment  Patient Details  Name: Bethany Cole MRN: [id number] Date of Birth: 1951-07-12 Referring Provider (PT): Rosemarie Ax, MD   Encounter Date: 03/03/2021   PT End of Session - 03/03/21 1153    Visit Number 10   PN on visit #6   Number of Visits 18    Date for PT Re-Evaluation 03/24/21    Authorization Type [employer] medicare    Progress Note Due on Visit 16    PT Start Time 1101    PT Stop Time 1143    PT Time Calculation (min) 42 min    Activity Tolerance Patient tolerated treatment well    Behavior During Therapy [facility] for tasks assessed/performed           Past Medical History:  Diagnosis Date  . Cataracts, bilateral   . Gastritis   . GERD (gastroesophageal reflux disease)   . Hypertension   . Hyperthyroidism   . OSA on CPAP   . Squamous cell carcinoma of skin     Past Surgical History:  Procedure Laterality Date  . ABDOMINAL HYSTERECTOMY    . ENDOMETRIAL ABLATION    . HERNIA REPAIR    . WRIST ARTHROSCOPY      There were no vitals filed for this visit.   Subjective Assessment - 03/03/21 1104    Subjective Pt reports her doctor mentioned she may have trochanteric bursitis in her R hip. Has been dealing with pain but today was better.    Pertinent History History of 2 falls with fracture of right wrist 2020, right ankle 2018. Had PT after previous fractures. history of B shoulder pain (now more pain in R shoulder), neck pain with headaches in the past, Hypertension,Hyperthyroidism, osteoporosis, cataracts B    Patient Stated Goals improve stamina/endurance, improve stability and balance, improve strength, decrease pain    Currently in Pain? No/denies                             [facility] Adult PT Treatment/Exercise - 03/03/21 0001      Ambulation/Gait    Ambulation/Gait Yes    Ambulation/Gait Assistance 5: Supervision    Ambulation Distance (Feet) 200 Feet    Assistive device None    Gait Pattern Step-through pattern;Decreased weight shift to right;Decreased step length - left;Decreased stance time - right    Stairs Yes    Stairs Assistance 5: Supervision    Stair Management Technique One rail Right;One rail Left;Alternating pattern;Forwards    Number of Stairs 13    Height of Stairs 8   3 trials to improve confidence with use of stairs     Knee/Hip Exercises: Stretches   Passive Hamstring Stretch Right;2 reps;30 seconds    Passive Hamstring Stretch Limitations supine with strap    ITB Stretch Right;2 reps;30 seconds    ITB Stretch Limitations supine with strap    Other Knee/Hip Stretches SKTC stretch 2x30 R side      Knee/Hip Exercises: Aerobic   Nustep L5 x 6 min (UEs/LEs)      Knee/Hip Exercises: Machines for Strengthening   Cybex Knee Extension 10# B LE 10 reps    Cybex Knee Flexion 20# B LE, 10 reps      Knee/Hip Exercises: Supine   Bridges Strengthening;Both;15 reps  PT Short Term Goals - 02/10/21 1451      PT SHORT TERM GOAL #1   Title Pt will be independent with initial HEP    Time 2    Period Weeks    Status Achieved    Target Date 12/16/20             PT Long Term Goals - 02/10/21 1451      PT LONG TERM GOAL #1   Title Pt will score at least 53/56 on berg to demo low fall risk    Time 6    Period Weeks    Status On-going   51/56   Target Date 03/24/21      PT LONG TERM GOAL #2   Title Pt will score at least 19/24 to demo decreased fall risk with community negotiation.    Time 6    Period Weeks    Status On-going   17/24   Target Date 03/24/21      PT LONG TERM GOAL #3   Title Pt will demo improved quality of eye movements without symptoms (no lightheaded, dizziness, or nausea) with VOR exercises to facilitate improved vestibular function for balance in community     Time 6    Period Weeks    Status On-going   still demonstrating difficultly maintaining fixation with horizontal and vertical VOR   Target Date 03/24/21      PT LONG TERM GOAL #4   Title Pt will be able to get up and down from the floor safely, with good control and no LOB, to demo improved functional strength    Time 8    Period Weeks    Status Achieved   reports tolerance of this activity with/without use of a chair     PT LONG TERM GOAL #5   Title Patient to improve ABC score to 90% to decrease risk of falls.    Baseline 86% 12/15/20    Time 6    Period Weeks    Status On-going   51%   Target Date 03/24/21                 Plan - 03/03/21 1155    Clinical Impression Statement Pt reported seeing her doctor on last week and them reporting possible trochanteric bursitisin her R hip. She notes that told her to ice prn and avoid exercises if any flare ups. Tried some hip stretching today, eliciting a good response from pt. Did stair and gait training to improve her confidence, as she states she is fearful of walking in public d/t history of falls. She did well with stairs with one hand rail used for assistance, noticed that she leans on her L side when walking and shows a mild antalgic gait with the R LE.  She notes LE weakness and fear of falliing again being the biggest limitations for her now. She reported that she will look into [employer] or [employer] fitness to incorporate home fitness routine.    Personal Factors and Comorbidities Comorbidity 2    Comorbidities History of 2 falls with fracture of right wrist 2020, right ankle 2018. Had PT after previous fractures. history of B shoulder pain (now more pain in R shoulder), neck pain with headaches in the past, Hypertension,Hyperthyroidism, osteoporosis, cataracts B    PT Frequency 2x / week    PT Duration 6 weeks    PT Treatment/Interventions ADLs/Self Care Home Management;Electrical Stimulation;Moist Heat;Cryotherapy;DME  Instruction;Neuromuscular re-education;Balance training;Therapeutic exercise;Therapeutic activities;Functional mobility  training;Stair training;Gait training;Patient/family education;Manual techniques;Energy conservation;Taping;Vestibular;Iontophoresis 4mg /ml Dexamethasone;Ultrasound;Vasopneumatic Device;Dry needling;Passive range of motion    PT Next Visit Plan Progress ankle and hip strengthening and stretching, balance    PT Home Exercise Plan See pt instructions    Consulted and Agree with Plan of Care Patient           Patient will benefit from skilled therapeutic intervention in order to improve the following deficits and impairments:  Abnormal gait,Decreased range of motion,Difficulty walking,Increased muscle spasms,Decreased endurance,Dizziness,Pain,Improper body mechanics,Impaired flexibility,Decreased balance,Decreased mobility,Decreased strength,Increased fascial restricitons,Postural dysfunction  Visit Diagnosis: Unsteadiness on feet  Muscle weakness (generalized)  Pain in right hip  Other abnormalities of gait and mobility  Abnormal posture  Dizziness and giddiness  History of falling     Problem List Patient Active Problem List   Diagnosis Date Noted  . Fall 11/17/2020  . Hyperlipemia 11/07/2020  . Osteoporosis 09/30/2020  . Hyperthyroidism   . Hypertension   . GERD (gastroesophageal reflux disease)   . Gastritis   . Cataracts, bilateral     Artist Pais, PTA 03/03/2021, 12:04 PM  [facility] [address]  [place], [place], [address] Phone: [phone]   Fax:  [phone]  Name: Bethany Cole MRN: [id number] Date of Birth: [birth year]

## 2021-03-05 ENCOUNTER — Other Ambulatory Visit: Payer: Self-pay

## 2021-03-05 ENCOUNTER — Encounter: Payer: Self-pay | Admitting: Physical Therapy

## 2021-03-05 ENCOUNTER — Ambulatory Visit: Payer: Medicare Other | Admitting: Physical Therapy

## 2021-03-05 DIAGNOSIS — M25551 Pain in right hip: Secondary | ICD-10-CM | POA: Diagnosis not present

## 2021-03-05 DIAGNOSIS — R2681 Unsteadiness on feet: Secondary | ICD-10-CM

## 2021-03-05 DIAGNOSIS — Z9181 History of falling: Secondary | ICD-10-CM | POA: Diagnosis not present

## 2021-03-05 DIAGNOSIS — R42 Dizziness and giddiness: Secondary | ICD-10-CM

## 2021-03-05 DIAGNOSIS — R293 Abnormal posture: Secondary | ICD-10-CM | POA: Diagnosis not present

## 2021-03-05 DIAGNOSIS — M6281 Muscle weakness (generalized): Secondary | ICD-10-CM | POA: Diagnosis not present

## 2021-03-05 DIAGNOSIS — R2689 Other abnormalities of gait and mobility: Secondary | ICD-10-CM | POA: Diagnosis not present

## 2021-03-05 NOTE — Patient Instructions (Signed)
    IONTOPHORESIS PATIENT PRECAUTIONS & CONTRAINDICATIONS:  Redness under one or both electrodes can occur.  This characterized by a uniform redness that usually disappears within 12 hours of treatment. Small pinhead size blisters may result in response to the drug.  Contact your physician if the problem persists more than 24 hours. On rare occasions, iontophoresis therapy can result in temporary skin reactions such as rash, inflammation, irritation or burns.  The skin reactions may be the result of individual sensitivity to the ionic solution used, the condition of the skin at the start of treatment, reaction to the materials in the electrodes, allergies or sensitivity to dexamethasone, or a poor connection between the patch and your skin.  Discontinue using iontophoresis if you have any of these reactions and report to your therapist. Remove the Patch or electrodes if you have any undue sensation of pain or burning during the treatment and report discomfort to your therapist. Tell your Therapist if you have had known adverse reactions to the application of electrical current. Approximate treatment time is 4-6 hours.  Remove the patch after 6 hours. The Patch can be worn during normal activity, however excessive motion where the electrodes have been placed can cause poor contact between the skin and the electrode or uneven electrical current resulting in greater risk of skin irritation. Keep out of the reach of children.   DO NOT use if you have a cardiac pacemaker or any other electrically sensitive implanted device. DO NOT use if you have a known sensitivity to dexamethasone. DO NOT use during Magnetic Resonance Imaging (MRI). DO NOT use over broken or compromised skin (e.g. sunburn, cuts, or acne) due to the increased risk of skin reaction. DO NOT SHAVE over the area to be treated:  To establish good contact between the Patch and the skin, excessive hair may be clipped. DO NOT place the  Patch or electrodes on or over your eyes, directly over your heart, or brain. DO NOT reuse the Patch or electrodes as this may cause burns to occur.   For questions, please contact your therapist at:  [facility] [place] [address]  [address] [place], [place], [address] Phone: [phone]   Fax:  [phone]  

## 2021-03-05 NOTE — Telephone Encounter (Signed)
Called pt again. No answer.  Left message for patient to call back.

## 2021-03-05 NOTE — Therapy (Signed)
[facility] [address]  [place], [place], [address] Phone: [phone]   Fax:  [phone]  Physical Therapy Treatment  Patient Details  Name: Bethany Cole MRN: [id number] Date of Birth: 18-Jun-1951 Referring Provider (PT): Rosemarie Ax, MD   Encounter Date: 03/05/2021   PT End of Session - 03/05/21 1410    Visit Number 11    Number of Visits 18    Date for PT Re-Evaluation 03/24/21    Authorization Type [employer] medicare    Progress Note Due on Visit 16    PT Start Time 1312    PT Stop Time 1405    PT Time Calculation (min) 53 min    Equipment Utilized During Treatment Gait belt    Activity Tolerance Patient tolerated treatment well    Behavior During Therapy WFL for tasks assessed/performed           Past Medical History:  Diagnosis Date  . Cataracts, bilateral   . Gastritis   . GERD (gastroesophageal reflux disease)   . Hypertension   . Hyperthyroidism   . OSA on CPAP   . Squamous cell carcinoma of skin     Past Surgical History:  Procedure Laterality Date  . ABDOMINAL HYSTERECTOMY    . ENDOMETRIAL ABLATION    . HERNIA REPAIR    . WRIST ARTHROSCOPY      There were no vitals filed for this visit.   Subjective Assessment - 03/05/21 1317    Subjective Bringing in walking sticks- "I need help with them."    Pertinent History History of 2 falls with fracture of right wrist 2020, right ankle 2018. Had PT after previous fractures. history of B shoulder pain (now more pain in R shoulder), neck pain with headaches in the past, Hypertension,Hyperthyroidism, osteoporosis, cataracts B    Patient Stated Goals improve stamina/endurance, improve stability and balance, improve strength, decrease pain    Currently in Pain? No/denies                             [facility] Adult PT Treatment/Exercise - 03/05/21 0001      Ambulation/Gait   Ambulation/Gait Yes    Ambulation/Gait  Assistance 5: Supervision;4: Min guard    Ambulation Distance (Feet) 335 Feet    Assistive device --   2 walking sticks   Gait Pattern Step-through pattern;Decreased weight shift to right;Decreased step length - left;Decreased stance time - right    Gait Comments cueing for proper AD sequencing; encouragement to reach slightly further with B poles      Knee/Hip Exercises: Stretches   ITB Stretch Right;2 reps;30 seconds    ITB Stretch Limitations supine with strap    Other Knee/Hip Stretches supine R KTOS 2x30"    Other Knee/Hip Stretches R sidelying TFL stretch 30"   less stretch sensation than supine- discontinued     Knee/Hip Exercises: Aerobic   Recumbent Bike L3 x 6 min      Knee/Hip Exercises: Standing   Other Standing Knee Exercises sidestepping with red loop around knees 30 ft 2x      Knee/Hip Exercises: Seated   Sit to Sand 2 sets;5 reps;without UE support   5x on airex, 5x with additional red TB above knees     Knee/Hip Exercises: Supine   Bridges with Clamshell Strengthening;1 set;10 reps   red TB     Modalities   Modalities Iontophoresis  Iontophoresis   Type of Iontophoresis Dexamethasone    Location R greater trochanter    Dose 84mA*min; 1.0 ml    Time 4 hour patch                  PT Education - 03/05/21 1409    Education Details update to HEP; edu on benefits, wear time, precautions for ionto use    Person(s) Educated Patient    Methods Explanation;Demonstration;Tactile cues;Verbal cues;Handout    Comprehension Verbalized understanding;Returned demonstration            PT Short Term Goals - 02/10/21 1451      PT SHORT TERM GOAL #1   Title Pt will be independent with initial HEP    Time 2    Period Weeks    Status Achieved    Target Date 12/16/20             PT Long Term Goals - 02/10/21 1451      PT LONG TERM GOAL #1   Title Pt will score at least 53/56 on berg to demo low fall risk    Time 6    Period Weeks    Status  On-going   51/56   Target Date 03/24/21      PT LONG TERM GOAL #2   Title Pt will score at least 19/24 to demo decreased fall risk with community negotiation.    Time 6    Period Weeks    Status On-going   17/24   Target Date 03/24/21      PT LONG TERM GOAL #3   Title Pt will demo improved quality of eye movements without symptoms (no lightheaded, dizziness, or nausea) with VOR exercises to facilitate improved vestibular function for balance in community    Time 6    Period Weeks    Status On-going   still demonstrating difficultly maintaining fixation with horizontal and vertical VOR   Target Date 03/24/21      PT LONG TERM GOAL #4   Title Pt will be able to get up and down from the floor safely, with good control and no LOB, to demo improved functional strength    Time 8    Period Weeks    Status Achieved   reports tolerance of this activity with/without use of a chair     PT LONG TERM GOAL #5   Title Patient to improve ABC score to 90% to decrease risk of falls.    Baseline 86% 12/15/20    Time 6    Period Weeks    Status On-going   51%   Target Date 03/24/21                 Plan - 03/05/21 1410    Clinical Impression Statement Patient arrived to session with walking sticks in hand, requesting height adjustment. Adjusted height and practiced gait training with cueing for proper AD sequencing and encourage reaching further with B poles. Patient appeared safe and stable throughout, thus agreed to set a goal for the patient to try a 10 minute walk with her walking sticks on a level surface that she feels comfortable with before her next appointment. Patient agreeable. Proceeded with TFL stretching and hip strengthening. Patient reported good benefit from stretching. Slightly limited ROM evident with bridges- likely weakness rather than hip flexor tightness. Updated HEP with exercises for hip pain that were well-tolerated today. Patient was educated on benefits, wear time, and  precautions of ionto  use and agreeable to try this modality today. Applied ionto patch to R hip at end of session. Patient reported understanding of all edu provided today and without complaints at end of session.    Personal Factors and Comorbidities Comorbidity 2    Comorbidities History of 2 falls with fracture of right wrist 2020, right ankle 2018. Had PT after previous fractures. history of B shoulder pain (now more pain in R shoulder), neck pain with headaches in the past, Hypertension,Hyperthyroidism, osteoporosis, cataracts B    PT Frequency 2x / week    PT Duration 6 weeks    PT Treatment/Interventions ADLs/Self Care Home Management;Electrical Stimulation;Moist Heat;Cryotherapy;DME Instruction;Neuromuscular re-education;Balance training;Therapeutic exercise;Therapeutic activities;Functional mobility training;Stair training;Gait training;Patient/family education;Manual techniques;Energy conservation;Taping;Vestibular;Iontophoresis 4mg /ml Dexamethasone;Ultrasound;Vasopneumatic Device;Dry needling;Passive range of motion    PT Next Visit Plan Progress ankle and hip strengthening and stretching, balance    PT Home Exercise Plan See pt instructions    Consulted and Agree with Plan of Care Patient           Patient will benefit from skilled therapeutic intervention in order to improve the following deficits and impairments:  Abnormal gait,Decreased range of motion,Difficulty walking,Increased muscle spasms,Decreased endurance,Dizziness,Pain,Improper body mechanics,Impaired flexibility,Decreased balance,Decreased mobility,Decreased strength,Increased fascial restricitons,Postural dysfunction  Visit Diagnosis: Unsteadiness on feet  Muscle weakness (generalized)  Pain in right hip  Other abnormalities of gait and mobility  Abnormal posture  Dizziness and giddiness  History of falling     Problem List Patient Active Problem List   Diagnosis Date Noted  . Fall 11/17/2020  .  Hyperlipemia 11/07/2020  . Osteoporosis 09/30/2020  . Hyperthyroidism   . Hypertension   . GERD (gastroesophageal reflux disease)   . Gastritis   . Cataracts, bilateral      Janene Harvey, PT, DPT 03/05/21 2:14 PM   [facility] [address]  [place], [place], [address] Phone: [phone]   Fax:  [phone]  Name: Telsa Dillavou MRN: [id number] Date of Birth: June 13, 1951

## 2021-03-06 NOTE — Progress Notes (Signed)
   I, Peterson Lombard, LAT, ATC acting as a scribe for [name], MD.  Subjective:    I'm seeing this patient as a consultation for: Dr. Domingo Mend. Note will be routed back to referring provider/PCP.  CC: Right hip pain  HPI: Pt is a 70 y/o female c/o R hip pain x about 2 months w/ no trauma or MOI. Pt is currently doing PT for ankles and balance. Pt reports she has been trying to walk more. Pt locates pain to the lateral aspect of R hip over the GT.    Low back pain: no Radiates: yes- into R thigh LE Numbness/tingling: no LE Weakness: no Aggravates: laying on R side, at night, sitting for extended periods Treatments tried: ionto patch, PT (1 visit)  Past medical history, Surgical history, Family history, Social history, Allergies, and medications have been entered into the medical record, reviewed.   Review of Systems: No new headache, visual changes, nausea, vomiting, diarrhea, constipation, dizziness, abdominal pain, skin rash, fevers, chills, night sweats, weight loss, swollen lymph nodes, body aches, joint swelling, muscle aches, chest pain, shortness of breath, mood changes, visual or auditory hallucinations.   Objective:    Vitals:   03/09/21 1256  BP: 128/80  Pulse: 68  SpO2: 98%   General: Well Developed, well nourished, and in no acute distress.  Neuro/Psych: Alert and oriented x3, extra-ocular muscles intact, able to move all 4 extremities, sensation grossly intact. Skin: Warm and dry, no rashes noted.  Respiratory: Not using accessory muscles, speaking in full sentences, trachea midline.  Cardiovascular: Pulses palpable, no extremity edema. Abdomen: Does not appear distended. MSK: Right hip normal-appearing Normal motion. Tender palpation greater trochanter. Hip abduction and external rotation strength are diminished 4/5.    Impression and Recommendations:    Assessment and Plan: 70 y.o. female with right lateral hip pain predominantly due to hip  abductor tendinopathy/greater trochanteric bursitis.  Plan for physical therapy.  Home exercise program also taught in clinic today.  Recheck in about 6 weeks.  Return sooner if needed.  Patient already has established physical therapy working on gait training.  Can add on this PT to that PT.Marland Kitchen  PDMP not reviewed this encounter. Orders Placed This Encounter  Procedures  . Ambulatory referral to Physical Therapy    Referral Priority:   Routine    Referral Type:   Physical Medicine    Referral Reason:   Specialty Services Required    Requested Specialty:   Physical Therapy   No orders of the defined types were placed in this encounter.   Discussed warning signs or symptoms. Please see discharge instructions. Patient expresses understanding.   The above documentation has been reviewed and is accurate and complete [name], M.D.

## 2021-03-09 ENCOUNTER — Ambulatory Visit: Payer: Self-pay

## 2021-03-09 ENCOUNTER — Ambulatory Visit: Payer: Medicare Other | Admitting: Family Medicine

## 2021-03-09 ENCOUNTER — Encounter: Payer: Self-pay | Admitting: Family Medicine

## 2021-03-09 ENCOUNTER — Other Ambulatory Visit: Payer: Self-pay

## 2021-03-09 VITALS — BP 128/80 | HR 68 | Ht 63.5 in | Wt 165.0 lb

## 2021-03-09 DIAGNOSIS — R29898 Other symptoms and signs involving the musculoskeletal system: Secondary | ICD-10-CM | POA: Diagnosis not present

## 2021-03-09 DIAGNOSIS — M7061 Trochanteric bursitis, right hip: Secondary | ICD-10-CM

## 2021-03-09 DIAGNOSIS — M25551 Pain in right hip: Secondary | ICD-10-CM | POA: Diagnosis not present

## 2021-03-09 NOTE — Progress Notes (Addendum)
   New patient Visit   Subjective  Bethany Cole is a 70 y.o. female who presents for the following: Annual Exam (Previous h/o scc top scalp tx- mohs in November 2020 per patient and she has records. ).   The following portions of the chart were reviewed this encounter and updated as appropriate:  Tobacco  Allergies  Meds  Problems  Med Hx  Surg Hx  Fam Hx      Objective  Well appearing patient in no apparent distress; mood and affect are within normal limits.  A focused examination was performed including extremities, including the arms, hands, fingers, and fingernails and the legs, feet, toes, and toenails. Relevant physical exam findings are noted in the Assessment and Plan.  Objective  Right Hand - Posterior: Erythematous patches with gritty scale.  Objective  Scalp: Has mohs In November of 2020.   Assessment & Plan  AK (actinic keratosis) Right Hand - Posterior  Destruction of lesion - Right Hand - Posterior Complexity: simple   Destruction method: cryotherapy   Informed consent: discussed and consent obtained   Timeout:  patient name, date of birth, surgical site, and procedure verified Lesion destroyed using liquid nitrogen: Yes   Cryotherapy cycles:  1 Outcome: patient tolerated procedure well with no complications   Post-procedure details: wound care instructions given    History of squamous cell carcinoma Scalp  Yearly skin exams.     I, Samie Reasons, PA-C, have reviewed all documentation's for this visit.  The documentation on 03/09/21 for the exam, diagnosis, procedures and orders are all accurate and complete.

## 2021-03-09 NOTE — Patient Instructions (Addendum)
Thank you for coming in today.  Please complete the exercises that the athletic trainer went over with you: View at [email or web address] using code: [email or web address]  I've referred you to Physical Therapy.  Let us know if you don't hear from them in one week.  Ok to also take Vit D3 over the counter around 2000 units a day.   Ok to also take Calcium around 1000mg  once or twice daily.   Continue weight bearing exercise.   Recheck with me in 6 weeks.

## 2021-03-10 ENCOUNTER — Encounter: Payer: Self-pay | Admitting: Gastroenterology

## 2021-03-10 ENCOUNTER — Ambulatory Visit: Payer: Medicare Other | Admitting: Gastroenterology

## 2021-03-10 ENCOUNTER — Ambulatory Visit: Payer: Medicare Other

## 2021-03-10 VITALS — BP 146/80 | HR 64 | Ht 63.0 in | Wt 163.2 lb

## 2021-03-10 DIAGNOSIS — K5909 Other constipation: Secondary | ICD-10-CM

## 2021-03-10 DIAGNOSIS — K31A0 Gastric intestinal metaplasia, unspecified: Secondary | ICD-10-CM | POA: Diagnosis not present

## 2021-03-10 DIAGNOSIS — K6389 Other specified diseases of intestine: Secondary | ICD-10-CM

## 2021-03-10 DIAGNOSIS — M25551 Pain in right hip: Secondary | ICD-10-CM | POA: Diagnosis not present

## 2021-03-10 DIAGNOSIS — R2681 Unsteadiness on feet: Secondary | ICD-10-CM | POA: Diagnosis not present

## 2021-03-10 DIAGNOSIS — R1084 Generalized abdominal pain: Secondary | ICD-10-CM

## 2021-03-10 DIAGNOSIS — R140 Abdominal distension (gaseous): Secondary | ICD-10-CM | POA: Diagnosis not present

## 2021-03-10 DIAGNOSIS — Z9181 History of falling: Secondary | ICD-10-CM

## 2021-03-10 DIAGNOSIS — R143 Flatulence: Secondary | ICD-10-CM

## 2021-03-10 DIAGNOSIS — K219 Gastro-esophageal reflux disease without esophagitis: Secondary | ICD-10-CM | POA: Diagnosis not present

## 2021-03-10 DIAGNOSIS — R293 Abnormal posture: Secondary | ICD-10-CM

## 2021-03-10 DIAGNOSIS — M6281 Muscle weakness (generalized): Secondary | ICD-10-CM

## 2021-03-10 DIAGNOSIS — R2689 Other abnormalities of gait and mobility: Secondary | ICD-10-CM

## 2021-03-10 DIAGNOSIS — R42 Dizziness and giddiness: Secondary | ICD-10-CM | POA: Diagnosis not present

## 2021-03-10 MED ORDER — PANTOPRAZOLE SODIUM 40 MG PO TBEC: 40.0000 mg | DELAYED_RELEASE_TABLET | Freq: Two times a day (BID) | ORAL | 1 refills | Status: DC

## 2021-03-10 NOTE — Progress Notes (Signed)
[facility] Progress Note  Chief Complaint: Constipation, bloating, gas and GERD  Subjective  History: Elinda was seen in initial office consult February 1 and in follow-up March 25 after transferring care moving here from [place].  Records from previous GI practice were reviewed, indicating history of reflux requiring high-dose acid suppression, gastrointestinal metaplasia without dysplasia in 2018, 2016 and 2012.  Upper endoscopy April 2021, though not clear from available records that extensive IM surveillance biopsies were taken. 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  Later recurrence treated with rifaximin.  Initial plan here was treatment with rifaximin if medication could be approved.  While awaiting that, patient was given a trial of metronidazole. From prior records and recorded as addendum to initial consult note: EGD 01/25/2020 for evaluation of heartburn and chest pain.  The esophagus, stomach and examined duodenum were normal   Screening colonoscopy December 2019.  Complete exam.  Good bowel prep. A 3 mm polyp was removed from the sigmoid colon.  Mild melanosis, biopsied.  Multiple large and small mouth diverticula in the sigmoid colon.:  Path compatible with a tubular adenoma, melanosis coli without pathologic abnormality.   EGD August 2018 for dysphagia and history of gastric body intestinal metaplasia in 2012 in 2016.  A medium nonbleeding diverticulum was found in the area of the papilla.  The entire stomach was normal.  Biopsies were taken for histology.  The esophagus was normal.  Empiric dilation was done with a savory dilator with mild resistance at 17 mm.  Antral biopsies compatible with chronic gastritis, no H. pylori.  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  Negative for dysplasia.   For methane SIBO patient was treated with Cipro  and neomycin in 2019.  She had significant improvement in bloating.  She had recurrent bloating and treated a second time with rifaximin _________________________   Dyann Ruddle had brief symptom improvement with metronidazole, but within a few days was back to pretreatment bloating and gas.  She still has constipation manifest as a BM 1-2 times daily but small stools.  She often feels need for bowel movement but cannot always pass something. She recalls clearly that rifaximin helped a lot for a year when treated in [place] Miralax just made her stool loose and everything was worse on that. She also tells me she has been undergoing a lot more stress since moving here 9 months ago and she feels that is worsening her digestive symptoms.  She has an upcoming appointment with behavioral health.  Reflux symptoms generally under control on twice daily Protonix.  Denies dysphagia or odynophagia. Tries to increase dietary fiber but many things seem to aggravate bloating and gas.  Recently added brown rice to her usual white rice and always cooks her vegetables.  She is decided against adding a daily fiber supplement out of concern it would increase bloating and gas, and I concurred with that.  ROS: Cardiovascular:  no chest pain Respiratory: no dyspnea Struggling with anxiety last 9 months since moving here Remainder of systems negative except as above The patient's Past Medical, Family and Social History were reviewed and are on file in the EMR.  Objective:  Med list reviewed  Current Outpatient Medications:  .  alendronate (FOSAMAX) 70 MG tablet, Take 1 tablet (70 mg total) by mouth every 7 (seven) days. Take with a full glass of water on an empty stomach., Disp: 4  tablet, Rfl: 11 .  dicyclomine (BENTYL) 10 MG capsule, Take 1 capsule (10 mg total) by mouth 4 (four) times daily -  before meals and at bedtime., Disp: 40 capsule, Rfl: 2 .  famotidine (PEPCID) 20 MG tablet, Take 20 mg by mouth daily., Disp: ,  Rfl:  .  irbesartan (AVAPRO) 75 MG tablet, Take 1 tablet (75 mg total) by mouth daily., Disp: 90 tablet, Rfl: 1 .  methimazole (TAPAZOLE) 5 MG tablet, Take 0.5 tablets (2.5 mg total) by mouth daily., Disp: 45 tablet, Rfl: 1 .  RESTASIS 0.05 % ophthalmic emulsion, 1 drop 2 (two) times daily., Disp: , Rfl:  .  rifaximin (XIFAXAN) 550 MG TABS tablet, Take 1 tablet (550 mg total) by mouth 3 (three) times daily., Disp: 42 tablet, Rfl: 0 .  pantoprazole (PROTONIX) 40 MG tablet, Take 1 tablet (40 mg total) by mouth 2 (two) times daily., Disp: 180 tablet, Rfl: 1   Vital signs in last 24 hrs: Vitals:   03/10/21 1048  BP: (!) 146/80  Pulse: 64   Wt Readings from Last 3 Encounters:  03/10/21 163 lb 3.2 oz (74 kg)  03/09/21 165 lb (74.8 kg)  02/27/21 163 lb 1.6 oz (74 kg)    Physical Exam  Well-appearing  HEENT: sclera anicteric, oral mucosa moist without lesions  Neck: supple, no thyromegaly, JVD or lymphadenopathy  Cardiac: RRR without murmurs, S1S2 heard, no peripheral edema  Pulm: clear to auscultation bilaterally, normal RR and effort noted  Abdomen: soft, no tenderness, with active bowel sounds. No guarding or palpable hepatosplenomegaly.  Skin; warm and dry, no jaundice or rash  Labs:   ___________________________________________ Radiologic studies:   ____________________________________________ Other:   _____________________________________________ Assessment & Plan  Assessment: Encounter Diagnoses  Name Primary?  . Chronic constipation Yes  . Bloating   . Generalized abdominal pain   . Small intestinal bacterial overgrowth (SIBO)   . Flatulence   . Gastroesophageal reflux disease without esophagitis   . Gastric intestinal metaplasia    Chronic stable constipation, sounds largely functional in nature, there may be some more focal pelvic floor dysfunction as well.  She has superimposed SIBO causing primarily bloating distention and gas. She has been treated a  few times in the past living in [place] (various antibiotic regimens), very little improvement recently with metronidazole.  Unfortunately, Medicare denied rifaximin.  Fortunately, we had samples today and were able to give her a 14-day course of 550 mg twice daily.  She was very happy about that and expects it will also lead to improvement in her SIBO symptoms.  Plan: Regarding the underlying constipation, I would not advocate use of Linzess, Amitiza or Trulance because she will most likely get loose stool.  Prucalopride must be used with caution in older patients. I recommended a 400 mg OTC magnesium supplement every morning.  She is up-to-date on colon cancer screening, and I think she is unlikely to have developed any cause of large bowel obstruction since then.  Longstanding gastric intestinal metaplasia without dysplasia, she should have an upper endoscopy later this year for more extensive surveillance biopsies.  On that occasion, we might consider doing Bravo pH testing with this patient off acid suppression to see how much reflux she is having.  There could be an element of functional heartburn.  Follow-up in 6 months, sooner if needed  42 minutes were spent on this encounter (including chart review, history/exam, counseling/coordination of care, and documentation) > 50% of that time was spent on counseling  and coordination of care.  Extensive chart review required since this was the first time I had seen her.  Topics discussed included: See above.  Nelida Meuse III

## 2021-03-10 NOTE — Therapy (Signed)
[facility] [address]  [place], [place], [address] Phone: [phone]   Fax:  [phone]  Physical Therapy Treatment  Patient Details  Name: Bethany Cole MRN: [id number] Date of Birth: [birth year] Referring Provider (PT): Rosemarie Ax, MD   Encounter Date: 03/10/2021   PT End of Session - 03/10/21 1530    Visit Number 12    Number of Visits 18    Date for PT Re-Evaluation 03/24/21    Authorization Type [employer] medicare    Progress Note Due on Visit 16    PT Start Time 1446    PT Stop Time 1528    PT Time Calculation (min) 42 min    Activity Tolerance Patient tolerated treatment well    Behavior During Therapy [facility] for tasks assessed/performed           Past Medical History:  Diagnosis Date  . Cataracts, bilateral   . Gastritis   . GERD (gastroesophageal reflux disease)   . Hypertension   . Hyperthyroidism   . OSA on CPAP   . Squamous cell carcinoma of skin     Past Surgical History:  Procedure Laterality Date  . ABDOMINAL HYSTERECTOMY    . ENDOMETRIAL ABLATION    . HERNIA REPAIR     as an infant  . WRIST ARTHROSCOPY Right     There were no vitals filed for this visit.   Subjective Assessment - 03/10/21 1450    Subjective Went to the doctor yesterday, MD says her hips and knees need to get stronger.    Pertinent History History of 2 falls with fracture of right wrist 2020, right ankle 2018. Had PT after previous fractures. history of B shoulder pain (now more pain in R shoulder), neck pain with headaches in the past, Hypertension,Hyperthyroidism, osteoporosis, cataracts B    Patient Stated Goals improve stamina/endurance, improve stability and balance, improve strength, decrease pain    Currently in Pain? Yes    Pain Score 6     Pain Location Hip    Pain Orientation Right    Pain Descriptors / Indicators Aching    Pain Type Chronic pain                              [facility] Adult PT Treatment/Exercise - 03/10/21 0001      Knee/Hip Exercises: Stretches   ITB Stretch Right;30 seconds    ITB Stretch Limitations passive in supine    Other Knee/Hip Stretches hip ABD stretch 30 sec passive in supine      Knee/Hip Exercises: Aerobic   Nustep L4x22min      Knee/Hip Exercises: Standing   Other Standing Knee Exercises marches with R TB with counter support 2x10    Other Standing Knee Exercises sidestepping with red loop around knees 30 ft 2x      Knee/Hip Exercises: Seated   Sit to Sand 10 reps;without UE support   Red TB above knees     Knee/Hip Exercises: Supine   Bridges Strengthening;Both;2 sets;10 reps    Bridges Limitations Red TB above knees    Other Supine Knee/Hip Exercises clamshells with red TB SL fallout 2x10 reps                    PT Short Term Goals - 02/10/21 1451      PT SHORT TERM GOAL #1   Title Pt will be  independent with initial HEP    Time 2    Period Weeks    Status Achieved    Target Date 12/16/20             PT Long Term Goals - 02/10/21 1451      PT LONG TERM GOAL #1   Title Pt will score at least 53/56 on berg to demo low fall risk    Time 6    Period Weeks    Status On-going   51/56   Target Date 03/24/21      PT LONG TERM GOAL #2   Title Pt will score at least 19/24 to demo decreased fall risk with community negotiation.    Time 6    Period Weeks    Status On-going   17/24   Target Date 03/24/21      PT LONG TERM GOAL #3   Title Pt will demo improved quality of eye movements without symptoms (no lightheaded, dizziness, or nausea) with VOR exercises to facilitate improved vestibular function for balance in community    Time 6    Period Weeks    Status On-going   still demonstrating difficultly maintaining fixation with horizontal and vertical VOR   Target Date 03/24/21      PT LONG TERM GOAL #4   Title Pt will be able to get up and down from the floor  safely, with good control and no LOB, to demo improved functional strength    Time 8    Period Weeks    Status Achieved   reports tolerance of this activity with/without use of a chair     PT LONG TERM GOAL #5   Title Patient to improve ABC score to 90% to decrease risk of falls.    Baseline 86% 12/15/20    Time 6    Period Weeks    Status On-going   51%   Target Date 03/24/21                 Plan - 03/10/21 1531    Clinical Impression Statement Pt reports that MD wants her to strengthenher hips and ankles a little more in order to receive injection for R hip. Reviewed hip strengthening exercises in HEP per pt request, gave modifications as needed. She does show increased tendency for valgus stress during STS, which the TB above the knees helps with cueing. She also reports relief with the ITB stretching at home so will try to target more lat hip and muscle stretching over the [facility] area.    Personal Factors and Comorbidities Comorbidity 2    Comorbidities History of 2 falls with fracture of right wrist 2020, right ankle 2018. Had PT after previous fractures. history of B shoulder pain (now more pain in R shoulder), neck pain with headaches in the past, Hypertension,Hyperthyroidism, osteoporosis, cataracts B    PT Frequency 2x / week    PT Duration 6 weeks    PT Treatment/Interventions ADLs/Self Care Home Management;Electrical Stimulation;Moist Heat;Cryotherapy;DME Instruction;Neuromuscular re-education;Balance training;Therapeutic exercise;Therapeutic activities;Functional mobility training;Stair training;Gait training;Patient/family education;Manual techniques;Energy conservation;Taping;Vestibular;Iontophoresis 4mg /ml Dexamethasone;Ultrasound;Vasopneumatic Device;Dry needling;Passive range of motion    PT Next Visit Plan Progress ankle and hip strengthening and stretching, balance    PT Home Exercise Plan See pt instructions    Consulted and Agree with Plan of Care Patient            Patient will benefit from skilled therapeutic intervention in order to improve the following deficits and impairments:  Abnormal gait,Decreased range of motion,Difficulty walking,Increased muscle spasms,Decreased endurance,Dizziness,Pain,Improper body mechanics,Impaired flexibility,Decreased balance,Decreased mobility,Decreased strength,Increased fascial restricitons,Postural dysfunction  Visit Diagnosis: Unsteadiness on feet  Muscle weakness (generalized)  Pain in right hip  Other abnormalities of gait and mobility  Abnormal posture  Dizziness and giddiness  History of falling     Problem List Patient Active Problem List   Diagnosis Date Noted  . Fall 11/17/2020  . Hyperlipemia 11/07/2020  . Osteoporosis 09/30/2020  . Hyperthyroidism   . Hypertension   . GERD (gastroesophageal reflux disease)   . Gastritis   . Cataracts, bilateral     Artist Pais, PTA 03/10/2021, 3:51 PM  [facility] [address]  [place], [place], [address] Phone: [phone]   Fax:  [phone]  Name: Amisha Pospisil MRN: [id number] Date of Birth: Oct 05, 1951

## 2021-03-10 NOTE — Patient Instructions (Signed)
If you are age 71 or older, your body mass index should be between 23-30. Your Body mass index is 28.91 kg/m. If this is out of the aforementioned range listed, please consider follow up with your Primary Care Provider.  If you are age 22 or younger, your body mass index should be between 19-25. Your Body mass index is 28.91 kg/m. If this is out of the aformentioned range listed, please consider follow up with your Primary Care Provider.   Samples of Xifaxan were given to the patient, quantity 42, Lot Number [id number]  exp 01-2025  It was a pleasure to see you today!  Thank you for trusting me with your gastrointestinal care!

## 2021-03-12 ENCOUNTER — Ambulatory Visit: Payer: Medicare Other

## 2021-03-12 NOTE — Addendum Note (Signed)
Addended by: Robyne Askew R on: 03/12/2021 08:42 AM   Modules accepted: Level of Service

## 2021-03-13 ENCOUNTER — Ambulatory Visit: Payer: Medicare Other | Admitting: Family Medicine

## 2021-03-17 ENCOUNTER — Other Ambulatory Visit: Payer: Self-pay

## 2021-03-17 ENCOUNTER — Ambulatory Visit: Payer: Medicare Other | Admitting: Physical Therapy

## 2021-03-17 ENCOUNTER — Encounter: Payer: Self-pay | Admitting: Physical Therapy

## 2021-03-17 DIAGNOSIS — R2689 Other abnormalities of gait and mobility: Secondary | ICD-10-CM | POA: Diagnosis not present

## 2021-03-17 DIAGNOSIS — M6281 Muscle weakness (generalized): Secondary | ICD-10-CM | POA: Diagnosis not present

## 2021-03-17 DIAGNOSIS — R2681 Unsteadiness on feet: Secondary | ICD-10-CM

## 2021-03-17 DIAGNOSIS — R42 Dizziness and giddiness: Secondary | ICD-10-CM | POA: Diagnosis not present

## 2021-03-17 DIAGNOSIS — R293 Abnormal posture: Secondary | ICD-10-CM | POA: Diagnosis not present

## 2021-03-17 DIAGNOSIS — M25551 Pain in right hip: Secondary | ICD-10-CM

## 2021-03-17 DIAGNOSIS — Z9181 History of falling: Secondary | ICD-10-CM | POA: Diagnosis not present

## 2021-03-17 NOTE — Patient Instructions (Signed)
Access Code: [email or web address] URL: [email or web address] Date: 03/17/2021 Prepared by: Grayling Congress  Exercises Supine ITB Stretch with Strap - 2 x daily - 7 x weekly - 2 sets - 30 sec hold Supine Piriformis Stretch with Foot on Ground - 2 x daily - 7 x weekly - 2 sets - 30 sec hold Supine Bridge with Resistance Band - 2 x daily - 7 x weekly - 2 sets - 10 reps Supine Bridge with Mini Swiss Ball Between Knees - 2 x daily - 7 x weekly - 2 sets - 10 reps Marching with Resistance - 2 x daily - 7 x weekly - 2 sets - 10 reps Sidelying Hip Abduction - 2 x daily - 7 x weekly - 2 sets - 10 reps

## 2021-03-17 NOTE — Therapy (Signed)
[facility] [address]  [place], [place], [address] Phone: [phone]   Fax:  [phone]  Physical Therapy Progress Note  Patient Details  Name: Bethany Cole MRN: [id number] Date of Birth: Aug 28, 1951 Referring Provider (PT): Rosemarie Ax, MD   Progress Note Reporting Period 02/16/21 to 03/17/21  See note below for Objective Data and Assessment of Progress/Goals.     Encounter Date: 03/17/2021   PT End of Session - 03/17/21 1414    Visit Number 13    Number of Visits 29    Date for PT Re-Evaluation 05/12/21    Authorization Type [employer]    Progress Note Due on Visit 23    PT Start Time 1314    PT Stop Time 1405    PT Time Calculation (min) 51 min    Equipment Utilized During Treatment Gait belt    Activity Tolerance Patient tolerated treatment well    Behavior During Therapy WFL for tasks assessed/performed           Past Medical History:  Diagnosis Date  . Cataracts, bilateral   . Gastritis   . GERD (gastroesophageal reflux disease)   . Hypertension   . Hyperthyroidism   . OSA on CPAP   . Squamous cell carcinoma of skin     Past Surgical History:  Procedure Laterality Date  . ABDOMINAL HYSTERECTOMY    . ENDOMETRIAL ABLATION    . HERNIA REPAIR     as an infant  . WRIST ARTHROSCOPY Right     There were no vitals filed for this visit.   Subjective Assessment - 03/17/21 1315    Subjective Missed last appointment d/t quarantine d/t COVID exposure. Notes that she did not test positive. Feels like her ankle strength is improving, but her hip is her main concern. Feels no change in her hip pain and notes no benefit with ionto patch. Does note that she has been walking with her walking sticks almost daily and has been able to return to gardening.    Pertinent History History of 2 falls with fracture of right wrist 2020, right ankle 2018. Had PT after previous fractures. history of  B shoulder pain (now more pain in R shoulder), neck pain with headaches in the past, Hypertension,Hyperthyroidism, osteoporosis, cataracts B    Patient Stated Goals improve stamina/endurance, improve stability and balance, improve strength, decrease pain    Currently in Pain? Yes    Pain Score 5     Pain Location Hip    Pain Orientation Right    Pain Descriptors / Indicators Aching    Pain Type Chronic pain              [facility] PT Assessment - 03/17/21 0001      Assessment   Medical Diagnosis [email or web address].Merril Abbe (ICD-10-CM) - Fall, initial encounter    Referring Provider (PT) Rosemarie Ax, MD      Strength   Right Hip Flexion 4-/5    Right Hip ABduction 3/5    Right Hip ADduction 3+/5    Left Hip Flexion 4-/5    Left Hip ABduction 3/5    Left Hip ADduction 3+/5    Right/Left Knee Right;Left    Right Knee Flexion 3+/5    Right Knee Extension 4/5    Left Knee Flexion 4/5    Left Knee Extension 4/5    Right Ankle Dorsiflexion 4+/5    Right Ankle Plantar Flexion 4+/5  10 reps   Right Ankle Inversion 4/5    Right Ankle Eversion 4-/5    Left Ankle Dorsiflexion 4/5    Left Ankle Plantar Flexion 4+/5   12 reps   Left Ankle Inversion 4/5    Left Ankle Eversion 4-/5      Dynamic Gait Index   Level Surface Normal    Change in Gait Speed Normal    Gait with Horizontal Head Turns Normal    Gait with Vertical Head Turns Normal    Gait and Pivot Turn Normal    Step Over Obstacle Normal    Step Around Obstacles Mild Impairment    Steps Mild Impairment    Total Score 22                         [facility] Adult PT Treatment/Exercise - 03/17/21 0001      Knee/Hip Exercises: Stretches   ITB Stretch Right;30 seconds    ITB Stretch Limitations passive in supine   clarification provided     Knee/Hip Exercises: Aerobic   Nustep L5x7min (UEs/LEs)      Knee/Hip Exercises: Standing   Hip Flexion Stengthening;Both;1 set;10 reps;Knee bent    Hip Flexion Limitations at counter;  resisted march with red TB      Knee/Hip Exercises: Supine   Bridges with Ball Squeeze Strengthening;Both;1 set;5 reps      Knee/Hip Exercises: Sidelying   Hip ABduction Strengthening;Right;1 set;10 reps    Hip ABduction Limitations cueing for proper alignment                  PT Education - 03/17/21 1413    Education Details discussion on objective progress and remaining impairments; edu on balance testing and functional relevance; update to HEP    Person(s) Educated Patient    Methods Explanation;Demonstration;Tactile cues;Handout    Comprehension Verbalized understanding;Returned demonstration            PT Short Term Goals - 03/17/21 1325      PT SHORT TERM GOAL #1   Title Pt will be independent with initial HEP    Time 2    Period Weeks    Status Achieved    Target Date 12/16/20             PT Long Term Goals - 03/17/21 1326      PT LONG TERM GOAL #1   Title Pt will score at least 53/56 on berg to demo low fall risk    Time 8    Period Weeks    Status On-going   03/17/21 NT   Target Date 05/12/21      PT LONG TERM GOAL #2   Title Pt will score at least 19/24 on DGI to demo decreased fall risk with community negotiation.    Time 8    Period Weeks    Status Achieved   22/24     PT LONG TERM GOAL #3   Title Pt will demo improved quality of eye movements without symptoms (no lightheaded, dizziness, or nausea) with VOR exercises to facilitate improved vestibular function for balance in community    Time 8    Period Weeks    Status On-going   NT- waiting for ENT appointment   Target Date 05/12/21      PT LONG TERM GOAL #4   Title Pt will be able to get up and down from the floor safely, with good control and no LOB, to  demo improved functional strength    Time 8    Period Weeks    Status Achieved   reports tolerance of this activity with/without use of a chair     PT LONG TERM GOAL #5   Title Patient to improve ABC score to 90% to decrease risk  of falls.    Baseline 86% 12/15/20    Time 8    Period Weeks    Status On-going   03/17/21: 61%   Target Date 05/12/21      Additional Long Term Goals   Additional Long Term Goals Yes      PT LONG TERM GOAL #6   Title Patient to score >/=4+/5 in B LE strength testing.    Time 8    Period Weeks    Status New    Target Date 05/12/21      PT LONG TERM GOAL #7   Title Patient to report 80% improvement in R hip pain.    Time 8    Period Weeks    Status New    Target Date 05/12/21                 Plan - 03/17/21 1415    Clinical Impression Statement Patient arrived to session with report of improvement in ankle strength, but noting that her hip is her main concern. Feels no change in her hip pain and notes no benefit with ionto patch. Reports increased activity recently, as she has been walking 10 minutes/day with her walking sticks in the driveway. Notes that she has also been able to work in the garden and noting improvement in stability with these activities. However, having hip stiffness after sitting on her garden stool. Strength testing revealed marked weakness in B hip and knee strength, however improved in B ankles. Patient scored 22/24 on DGI today, demonstrating significant improvement in balance and now demonstrating decreased risk of falls. ABC score has improved by 10% since last assessed, indicating improvement in balance confidence. Berg not tested today d/t time constraint. Updated goals with strength and hip pain goals to further address patient's current concerns and updated HEP to address hip weakness. Patient reported understanding and without complaints at end of session. Patient is demonstrating good improvement in ankle strength and balance. Would benefit from additional skilled PT services 2x/week for 8 week to address remaining balance goals and strength deficits.    Personal Factors and Comorbidities Comorbidity 2    Comorbidities History of 2 falls with  fracture of right wrist 2020, right ankle 2018. Had PT after previous fractures. history of B shoulder pain (now more pain in R shoulder), neck pain with headaches in the past, Hypertension,Hyperthyroidism, osteoporosis, cataracts B    PT Frequency 2x / week    PT Duration 8 weeks    PT Treatment/Interventions ADLs/Self Care Home Management;Electrical Stimulation;Moist Heat;Cryotherapy;DME Instruction;Neuromuscular re-education;Balance training;Therapeutic exercise;Therapeutic activities;Functional mobility training;Stair training;Gait training;Patient/family education;Manual techniques;Energy conservation;Taping;Vestibular;Iontophoresis 4mg /ml Dexamethasone;Ultrasound;Vasopneumatic Device;Dry needling;Passive range of motion    PT Next Visit Plan Progress ankle and hip strengthening and stretching, balance    PT Home Exercise Plan See pt instructions    Consulted and Agree with Plan of Care Patient           Patient will benefit from skilled therapeutic intervention in order to improve the following deficits and impairments:  Abnormal gait,Decreased range of motion,Difficulty walking,Increased muscle spasms,Decreased endurance,Dizziness,Pain,Improper body mechanics,Impaired flexibility,Decreased balance,Decreased mobility,Decreased strength,Increased fascial restricitons,Postural dysfunction  Visit Diagnosis: Unsteadiness on feet  Muscle weakness (  generalized)  Pain in right hip  Other abnormalities of gait and mobility  Abnormal posture  Dizziness and giddiness     Problem List Patient Active Problem List   Diagnosis Date Noted  . Fall 11/17/2020  . Hyperlipemia 11/07/2020  . Osteoporosis 09/30/2020  . Hyperthyroidism   . Hypertension   . GERD (gastroesophageal reflux disease)   . Gastritis   . Cataracts, bilateral      Janene Harvey, PT, DPT 03/17/21 2:19 PM   [facility] [address]  [place], [place], [address] Phone: [phone]   Fax:  [phone]  Name: Bethany Cole MRN: [id number] Date of Birth: 03-16-1951

## 2021-03-19 ENCOUNTER — Encounter: Payer: Self-pay | Admitting: Physical Therapy

## 2021-03-19 ENCOUNTER — Ambulatory Visit: Payer: Medicare Other | Admitting: Physical Therapy

## 2021-03-19 ENCOUNTER — Other Ambulatory Visit: Payer: Self-pay

## 2021-03-19 DIAGNOSIS — R293 Abnormal posture: Secondary | ICD-10-CM

## 2021-03-19 DIAGNOSIS — M6281 Muscle weakness (generalized): Secondary | ICD-10-CM | POA: Diagnosis not present

## 2021-03-19 DIAGNOSIS — R2689 Other abnormalities of gait and mobility: Secondary | ICD-10-CM | POA: Diagnosis not present

## 2021-03-19 DIAGNOSIS — M25551 Pain in right hip: Secondary | ICD-10-CM | POA: Diagnosis not present

## 2021-03-19 DIAGNOSIS — R42 Dizziness and giddiness: Secondary | ICD-10-CM

## 2021-03-19 DIAGNOSIS — R2681 Unsteadiness on feet: Secondary | ICD-10-CM | POA: Diagnosis not present

## 2021-03-19 DIAGNOSIS — Z9181 History of falling: Secondary | ICD-10-CM | POA: Diagnosis not present

## 2021-03-19 NOTE — Therapy (Signed)
[facility] [address]  [place], [place], [address] Phone: [phone]   Fax:  [phone]  Physical Therapy Treatment  Patient Details  Name: Bethany Cole MRN: [id number] Date of Birth: [birth year] Referring Provider (PT): Rosemarie Ax, MD   Encounter Date: 03/19/2021   PT End of Session - 03/19/21 1443    Visit Number 14    Number of Visits 29    Date for PT Re-Evaluation 05/12/21    Authorization Type [employer] medicare    Progress Note Due on Visit 23    PT Start Time 1410   pt late   PT Stop Time 1443    PT Time Calculation (min) 33 min    Activity Tolerance Patient tolerated treatment well    Behavior During Therapy WFL for tasks assessed/performed           Past Medical History:  Diagnosis Date  . Cataracts, bilateral   . Gastritis   . GERD (gastroesophageal reflux disease)   . Hypertension   . Hyperthyroidism   . OSA on CPAP   . Squamous cell carcinoma of skin     Past Surgical History:  Procedure Laterality Date  . ABDOMINAL HYSTERECTOMY    . ENDOMETRIAL ABLATION    . HERNIA REPAIR     as an infant  . WRIST ARTHROSCOPY Right     There were no vitals filed for this visit.   Subjective Assessment - 03/19/21 1407    Subjective Apologizes for coming in late today- had the appointment time wrong. Notes some challenge with her stretches.    Pertinent History History of 2 falls with fracture of right wrist 2020, right ankle 2018. Had PT after previous fractures. history of B shoulder pain (now more pain in R shoulder), neck pain with headaches in the past, Hypertension,Hyperthyroidism, osteoporosis, cataracts B    Patient Stated Goals improve stamina/endurance, improve stability and balance, improve strength, decrease pain    Currently in Pain? Yes    Pain Score 5     Pain Location Hip    Pain Orientation Right    Pain Descriptors / Indicators Aching    Pain Type Chronic pain                              [facility] Adult PT Treatment/Exercise - 03/19/21 0001      Knee/Hip Exercises: Stretches   ITB Stretch Right;30 seconds    ITB Stretch Limitations passive in supine   cueing to hold stretch for full duration   Piriformis Stretch Right;2 reps;30 seconds    Piriformis Stretch Limitations supine KTOS      Knee/Hip Exercises: Aerobic   Nustep L5x63min (UEs/LEs)      Knee/Hip Exercises: Standing   Other Standing Knee Exercises sidestepping with yellow loop around knees 4x length of counter   cues to maintain toes forward     Knee/Hip Exercises: Seated   Sit to Sand 10 reps;without UE support;1 set   on airex + blue medball at chest     Knee/Hip Exercises: Supine   Bridges with Clamshell Strengthening;1 set;10 reps   red TB; cueing for proper HKA alignment     Knee/Hip Exercises: Sidelying   Hip ABduction Strengthening;Right;1 set;10 reps;Left    Hip ABduction Limitations cueing for proper alignment    Hip ADduction Strengthening;Right;Left;1 set;10 reps    Hip ADduction Limitations opposite LE resting on bolster  Clams 10x each with yellow TB   cues to maintain hips forward                   PT Short Term Goals - 03/17/21 1325      PT SHORT TERM GOAL #1   Title Pt will be independent with initial HEP    Time 2    Period Weeks    Status Achieved    Target Date 12/16/20             PT Long Term Goals - 03/19/21 1450      PT LONG TERM GOAL #1   Title Pt will score at least 53/56 on berg to demo low fall risk    Time 8    Period Weeks    Status On-going   03/17/21 NT     PT LONG TERM GOAL #2   Title Pt will score at least 19/24 on DGI to demo decreased fall risk with community negotiation.    Time 8    Period Weeks    Status Achieved   22/24     PT LONG TERM GOAL #3   Title Pt will demo improved quality of eye movements without symptoms (no lightheaded, dizziness, or nausea) with VOR exercises to facilitate improved  vestibular function for balance in community    Time 8    Period Weeks    Status On-going   NT- waiting for ENT appointment     PT LONG TERM GOAL #4   Title Pt will be able to get up and down from the floor safely, with good control and no LOB, to demo improved functional strength    Time 8    Period Weeks    Status Achieved   reports tolerance of this activity with/without use of a chair     PT LONG TERM GOAL #5   Title Patient to improve ABC score to 90% to decrease risk of falls.    Baseline 86% 12/15/20    Time 8    Period Weeks    Status On-going   03/17/21: 61%     PT LONG TERM GOAL #6   Title Patient to score >/=4+/5 in B LE strength testing.    Time 8    Period Weeks    Status On-going      PT LONG TERM GOAL #7   Title Patient to report 80% improvement in R hip pain.    Time 8    Period Weeks    Status On-going                 Plan - 03/19/21 1444    Clinical Impression Statement Patient arrived to session with report of a challenge with hip stretching at home. Reviewed most recent HEP update with intermittent cueing for proper positioning and form. Patient still demonstrates hip instability with bridges but slightly improved lift demonstrated today. Patient tolerated sidelying hip strengthening ther-ex without c/o hip pain, however ROM limited d/t weakness. STS transfers were performed on foam for balance and proprioceptive challenge, with patient demonstrating initially hesitation to demonstrate adequate forward translation initially, but improved with cueing. Patient without complaints at end of session. Progressing well towards goals.    Personal Factors and Comorbidities Comorbidity 2    Comorbidities History of 2 falls with fracture of right wrist 2020, right ankle 2018. Had PT after previous fractures. history of B shoulder pain (now more pain in R shoulder), neck pain with headaches  in the past, Hypertension,Hyperthyroidism, osteoporosis, cataracts B    PT  Frequency 2x / week    PT Duration 8 weeks    PT Treatment/Interventions ADLs/Self Care Home Management;Electrical Stimulation;Moist Heat;Cryotherapy;DME Instruction;Neuromuscular re-education;Balance training;Therapeutic exercise;Therapeutic activities;Functional mobility training;Stair training;Gait training;Patient/family education;Manual techniques;Energy conservation;Taping;Vestibular;Iontophoresis 4mg /ml Dexamethasone;Ultrasound;Vasopneumatic Device;Dry needling;Passive range of motion    PT Next Visit Plan Progress ankle and hip strengthening and stretching, balance    PT Home Exercise Plan See pt instructions    Consulted and Agree with Plan of Care Patient           Patient will benefit from skilled therapeutic intervention in order to improve the following deficits and impairments:  Abnormal gait,Decreased range of motion,Difficulty walking,Increased muscle spasms,Decreased endurance,Dizziness,Pain,Improper body mechanics,Impaired flexibility,Decreased balance,Decreased mobility,Decreased strength,Increased fascial restricitons,Postural dysfunction  Visit Diagnosis: Unsteadiness on feet  Muscle weakness (generalized)  Pain in right hip  Other abnormalities of gait and mobility  Abnormal posture  Dizziness and giddiness     Problem List Patient Active Problem List   Diagnosis Date Noted  . Fall 11/17/2020  . Hyperlipemia 11/07/2020  . Osteoporosis 09/30/2020  . Hyperthyroidism   . Hypertension   . GERD (gastroesophageal reflux disease)   . Gastritis   . Cataracts, bilateral      Janene Harvey, PT, DPT 03/19/21 2:51 PM   [facility] [address]  [place], [place], [address] Phone: [phone]   Fax:  [phone]  Name: Bethany Cole MRN: [id number] Date of Birth: 1951-06-05

## 2021-03-20 ENCOUNTER — Telehealth: Payer: Self-pay | Admitting: Internal Medicine

## 2021-03-20 DIAGNOSIS — I10 Essential (primary) hypertension: Secondary | ICD-10-CM

## 2021-03-20 MED ORDER — IRBESARTAN 75 MG PO TABS: 75.0000 mg | ORAL_TABLET | Freq: Every day | ORAL | 1 refills | Status: DC

## 2021-03-20 NOTE — Telephone Encounter (Signed)
Rx sent 

## 2021-03-20 NOTE — Telephone Encounter (Signed)
Patient is calling and requesting a refill for irbesartan (AVAPRO) 75 MG tablet to be sent to  [facility] [place]   [facility] [place] [address]  [place], [place] [address]  Phone:  [phone] Fax:  [phone]  CB is [phone]

## 2021-03-24 ENCOUNTER — Ambulatory Visit: Payer: Medicare Other

## 2021-03-24 ENCOUNTER — Other Ambulatory Visit: Payer: Self-pay

## 2021-03-24 DIAGNOSIS — M25551 Pain in right hip: Secondary | ICD-10-CM | POA: Diagnosis not present

## 2021-03-24 DIAGNOSIS — R2681 Unsteadiness on feet: Secondary | ICD-10-CM | POA: Diagnosis not present

## 2021-03-24 DIAGNOSIS — M6281 Muscle weakness (generalized): Secondary | ICD-10-CM | POA: Diagnosis not present

## 2021-03-24 DIAGNOSIS — R42 Dizziness and giddiness: Secondary | ICD-10-CM | POA: Diagnosis not present

## 2021-03-24 DIAGNOSIS — Z9181 History of falling: Secondary | ICD-10-CM

## 2021-03-24 DIAGNOSIS — R293 Abnormal posture: Secondary | ICD-10-CM | POA: Diagnosis not present

## 2021-03-24 DIAGNOSIS — R2689 Other abnormalities of gait and mobility: Secondary | ICD-10-CM | POA: Diagnosis not present

## 2021-03-24 NOTE — Therapy (Signed)
[facility] [address]  [place], [place], [address] Phone: [phone]   Fax:  [phone]  Physical Therapy Treatment  Patient Details  Name: Bethany Cole MRN: [id number] Date of Birth: Sep 12, 1951 Referring Provider (PT): Rosemarie Ax, MD   Encounter Date: 03/24/2021   PT End of Session - 03/24/21 1527    Visit Number 15    Number of Visits 29    Date for PT Re-Evaluation 05/12/21    Authorization Type [employer]    Progress Note Due on Visit 23    PT Start Time 1443    PT Stop Time 1525    PT Time Calculation (min) 42 min    Activity Tolerance Patient tolerated treatment well    Behavior During Therapy [facility] for tasks assessed/performed           Past Medical History:  Diagnosis Date  . Cataracts, bilateral   . Gastritis   . GERD (gastroesophageal reflux disease)   . Hypertension   . Hyperthyroidism   . OSA on CPAP   . Squamous cell carcinoma of skin     Past Surgical History:  Procedure Laterality Date  . ABDOMINAL HYSTERECTOMY    . ENDOMETRIAL ABLATION    . HERNIA REPAIR     as an infant  . WRIST ARTHROSCOPY Right     There were no vitals filed for this visit.   Subjective Assessment - 03/24/21 1445    Subjective Last night was in a lot of pain didn't do anything to aggrevate her R hip.    Pertinent History History of 2 falls with fracture of right wrist 2020, right ankle 2018. Had PT after previous fractures. history of B shoulder pain (now more pain in R shoulder), neck pain with headaches in the past, Hypertension,Hyperthyroidism, osteoporosis, cataracts B    Patient Stated Goals improve stamina/endurance, improve stability and balance, improve strength, decrease pain    Currently in Pain? Yes    Pain Score 4     Pain Location Hip    Pain Orientation Right;Lateral    Pain Descriptors / Indicators Aching    Pain Type Chronic pain                              [facility] Adult PT Treatment/Exercise - 03/24/21 0001      Exercises   Exercises Knee/Hip      Knee/Hip Exercises: Stretches   Passive Hamstring Stretch Right;30 seconds    Passive Hamstring Stretch Limitations manual stretch    ITB Stretch Right;2 reps;30 seconds    ITB Stretch Limitations manual stretch    Piriformis Stretch Right;30 seconds    Piriformis Stretch Limitations manual stretch      Knee/Hip Exercises: Aerobic   Recumbent Bike L3 x 6 min      Knee/Hip Exercises: Machines for Strengthening   Cybex Leg Press 20# 5 reps, 15# 10 reps      Knee/Hip Exercises: Standing   Hip Flexion Stengthening;Both;10 reps;Knee straight    Hip Flexion Limitations red TB counter assist    Hip Abduction Stengthening;Both;2 sets;10 reps;Knee straight    Abduction Limitations red TB above ankles counter assist    Other Standing Knee Exercises sidestepping with red loop around knees 3x length of counter   cues needed to keep feet foward     Knee/Hip Exercises: Seated   Ball Squeeze 10x3"  Knee/Hip Exercises: Supine   Bridges with Clamshell Strengthening;Both;15 reps   red TB above knees   Straight Leg Raises Strengthening;Both;2 sets;5 reps    Other Supine Knee/Hip Exercises clamshells red TB 10 reps    Other Supine Knee/Hip Exercises ball squeezing 10x3"                    PT Short Term Goals - 03/17/21 1325      PT SHORT TERM GOAL #1   Title Pt will be independent with initial HEP    Time 2    Period Weeks    Status Achieved    Target Date 12/16/20             PT Long Term Goals - 03/19/21 1450      PT LONG TERM GOAL #1   Title Pt will score at least 53/56 on berg to demo low fall risk    Time 8    Period Weeks    Status On-going   03/17/21 NT     PT LONG TERM GOAL #2   Title Pt will score at least 19/24 on DGI to demo decreased fall risk with community negotiation.    Time 8    Period Weeks    Status Achieved    22/24     PT LONG TERM GOAL #3   Title Pt will demo improved quality of eye movements without symptoms (no lightheaded, dizziness, or nausea) with VOR exercises to facilitate improved vestibular function for balance in community    Time 8    Period Weeks    Status On-going   NT- waiting for ENT appointment     PT LONG TERM GOAL #4   Title Pt will be able to get up and down from the floor safely, with good control and no LOB, to demo improved functional strength    Time 8    Period Weeks    Status Achieved   reports tolerance of this activity with/without use of a chair     PT LONG TERM GOAL #5   Title Patient to improve ABC score to 90% to decrease risk of falls.    Baseline 86% 12/15/20    Time 8    Period Weeks    Status On-going   03/17/21: 61%     PT LONG TERM GOAL #6   Title Patient to score >/=4+/5 in B LE strength testing.    Time 8    Period Weeks    Status On-going      PT LONG TERM GOAL #7   Title Patient to report 80% improvement in R hip pain.    Time 8    Period Weeks    Status On-going                 Plan - 03/24/21 1530    Clinical Impression Statement Pt reported that she was in a lot of pain last night and that she didn't do too much to aggrevate her R hip that she remembered. Pt showed much weakness in her R hip with most exercises. She required cueing during the hip ABD exercises to keep her feet foward. Also showed fatigue with the SLRs and decreased TKE with increased repetitions. She responded well to the manual stretching today, and w/o further complaints post session.    Personal Factors and Comorbidities Comorbidity 2    Comorbidities History of 2 falls with fracture of right wrist 2020, right ankle 2018. Had  PT after previous fractures. history of B shoulder pain (now more pain in R shoulder), neck pain with headaches in the past, Hypertension,Hyperthyroidism, osteoporosis, cataracts B    PT Frequency 2x / week    PT Duration 8 weeks    PT  Treatment/Interventions ADLs/Self Care Home Management;Electrical Stimulation;Moist Heat;Cryotherapy;DME Instruction;Neuromuscular re-education;Balance training;Therapeutic exercise;Therapeutic activities;Functional mobility training;Stair training;Gait training;Patient/family education;Manual techniques;Energy conservation;Taping;Vestibular;Iontophoresis 4mg /ml Dexamethasone;Ultrasound;Vasopneumatic Device;Dry needling;Passive range of motion    PT Next Visit Plan Progress ankle and hip strengthening and stretching, balance    PT Home Exercise Plan See pt instructions    Consulted and Agree with Plan of Care Patient           Patient will benefit from skilled therapeutic intervention in order to improve the following deficits and impairments:  Abnormal gait,Decreased range of motion,Difficulty walking,Increased muscle spasms,Decreased endurance,Dizziness,Pain,Improper body mechanics,Impaired flexibility,Decreased balance,Decreased mobility,Decreased strength,Increased fascial restricitons,Postural dysfunction  Visit Diagnosis: Unsteadiness on feet  Muscle weakness (generalized)  Pain in right hip  Other abnormalities of gait and mobility  Dizziness and giddiness  History of falling     Problem List Patient Active Problem List   Diagnosis Date Noted  . Fall 11/17/2020  . Hyperlipemia 11/07/2020  . Osteoporosis 09/30/2020  . Hyperthyroidism   . Hypertension   . GERD (gastroesophageal reflux disease)   . Gastritis   . Cataracts, bilateral     Artist Pais, PTA 03/24/2021, 4:18 PM  [facility] [address]  [place], [place], [address] Phone: [phone]   Fax:  [phone]  Name: Bethany Cole MRN: [id number] Date of Birth: 11-Jul-1951

## 2021-03-25 ENCOUNTER — Telehealth: Payer: Self-pay

## 2021-03-25 NOTE — Telephone Encounter (Signed)
Pt has appt with CY at 4 for a sleep consult.  Call the DME in the morning to get the DL faxed to [place].

## 2021-03-25 NOTE — Telephone Encounter (Signed)
ATC patient about her appointment tomorrow to see if she is already wearing CPAP and ask who her DME- medical supply company is. LMTCB  When patient calls back please ask her what company she is using for her CPAP machine and supplies.

## 2021-03-25 NOTE — Progress Notes (Signed)
03/26/21- 70 yoF never smoker for sleep evaluation with concern of OSA on CPAP Medical problem list includes HTN, Hypercholesterolemia, Hyperthyroid,  Epworth score- 7.5 Body weight today-163 lbs Covid vax- 4 Moderna -----Pt is being referred for a sleep consult. States she is currently on CPAP. Pt just moved here from [place] so is needing to get established with a new DME. Has used CPAP x 15 years- original study not available. Moved here in past year. Still using CPAP compliantly but mask worn out. Thinks fixed pressure 11. CPAP is big help. Sleeps alone.  Not aware of heart or lung disease.  Some nasal congesstion  Prior to Admission medications   Medication Sig Start Date End Date Taking? Authorizing Provider  denosumab (PROLIA) 60 MG/ML SOSY injection Inject 60 mg into the skin every 6 (six) months.   Yes [provider]  dicyclomine (BENTYL) 10 MG capsule Take 1 capsule (10 mg total) by mouth 4 (four) times daily -  before meals and at bedtime. 01/16/21  Yes Willia Craze, NP  famotidine (PEPCID) 20 MG tablet Take 20 mg by mouth daily.   Yes [provider]  irbesartan (AVAPRO) 75 MG tablet Take 1 tablet (75 mg total) by mouth daily. 03/20/21  Yes Isaac Bliss, Rayford Halsted, MD  methimazole (TAPAZOLE) 5 MG tablet Take 0.5 tablets (2.5 mg total) by mouth daily. 12/03/20  Yes Shamleffer, Melanie Crazier, MD  pantoprazole (PROTONIX) 40 MG tablet Take 1 tablet (40 mg total) by mouth 2 (two) times daily. 03/10/21  Yes Danis, Estill Cotta III, MD  RESTASIS 0.05 % ophthalmic emulsion 1 drop 2 (two) times daily. 07/20/20  Yes [provider]   Past Medical History:  Diagnosis Date   Cataracts, bilateral    Gastritis    GERD (gastroesophageal reflux disease)    Hypertension    Hyperthyroidism    OSA on CPAP    Squamous cell carcinoma of skin    Past Surgical History:  Procedure Laterality Date   ABDOMINAL HYSTERECTOMY     ENDOMETRIAL ABLATION     HERNIA REPAIR      as an infant   WRIST ARTHROSCOPY Right    Family History  Problem Relation Age of Onset   Ovarian cancer Mother    Stomach cancer Mother        mets from ovarian cancer   Heart disease Father    Cancer - Other Father        laryngeal cancer   Colon cancer Neg Hx    Esophageal cancer Neg Hx    Pancreatic cancer Neg Hx    Liver disease Neg Hx    Social History   Socioeconomic History   Marital status: Divorced    Spouse name: Not on file   Number of children: Not on file   Years of education: Not on file   Highest education level: Not on file  Occupational History   Not on file  Tobacco Use   Smoking status: Never Smoker   Smokeless tobacco: Never Used  Vaping Use   Vaping Use: Never used  Substance and Sexual Activity   Alcohol use: Yes    Comment: occasional   Drug use: Never   Sexual activity: Not on file  Other Topics Concern   Not on file  Social History Narrative   Not on file   Social Determinants of Health   Financial Resource Strain: Not on file  Food Insecurity: Not on file  Transportation Needs: Not on file  Physical Activity: Not on file  Stress: Not on file  Social Connections: Not on file  Intimate Partner Violence: Not on file   ROS-see HPI   + = positive Constitutional:    weight loss, night sweats, fevers, chills, fatigue, lassitude. HEENT:    headaches, difficulty swallowing, tooth/dental problems, sore throat,       sneezing, itching, ear ache, +nasal congestion, post nasal drip, snoring CV:    chest pain, orthopnea, PND, swelling in lower extremities, anasarca,                                   dizziness, palpitations Resp:   shortness of breath with exertion or at rest.                productive cough,   non-productive cough, coughing up of blood.              change in color of mucus.  wheezing.   Skin:    rash or lesions. GI:  No-   heartburn, indigestion, abdominal pain, nausea, vomiting, diarrhea,                 change in bowel  habits, loss of appetite GU: dysuria, change in color of urine, no urgency or frequency.   flank pain. MS:   joint pain, stiffness, decreased range of motion, back pain. Neuro-     nothing unusual Psych:  change in mood or affect.  depression or anxiety.   memory loss.  OBJ- Physical Exam General- Alert, Oriented, Affect-appropriate, Distress- none acute, + obese Skin- rash-none, lesions- none, excoriation- none Lymphadenopathy- none Head- atraumatic            Eyes- Gross vision intact, PERRLA, conjunctivae and secretions clear            Ears- Hearing, canals-normal            Nose- Clear, no-Septal dev, mucus, polyps, erosion, perforation             Throat- Mallampati II , mucosa clear , drainage- none, tonsils- atrophic, + teeth Neck- flexible , trachea midline, no stridor , thyroid nl, carotid no bruit Chest - symmetrical excursion , unlabored           Heart/CV- RRR , no murmur , no gallop  , no rub, nl s1 s2                           - JVD- none , edema- none, stasis changes- none, varices- none           Lung- clear to P&A, wheeze- none, cough- none , dullness-none, rub- none           Chest wall-  Abd-  Br/ Gen/ Rectal- Not done, not indicated Extrem- cyanosis- none, clubbing, none, atrophy- none, strength- nl Neuro- grossly intact to observation

## 2021-03-25 NOTE — Telephone Encounter (Signed)
Patient uses Chief of Staff. Phone number is [phone].

## 2021-03-26 ENCOUNTER — Encounter: Payer: Self-pay | Admitting: Internal Medicine

## 2021-03-26 ENCOUNTER — Ambulatory Visit: Payer: Medicare Other | Admitting: Family Medicine

## 2021-03-26 ENCOUNTER — Ambulatory Visit (INDEPENDENT_AMBULATORY_CARE_PROVIDER_SITE_OTHER): Payer: Medicare Other | Admitting: Internal Medicine

## 2021-03-26 ENCOUNTER — Other Ambulatory Visit: Payer: Self-pay

## 2021-03-26 VITALS — BP 116/64 | HR 68 | Ht 63.0 in | Wt 163.8 lb

## 2021-03-26 DIAGNOSIS — Z9989 Dependence on other enabling machines and devices: Secondary | ICD-10-CM | POA: Diagnosis not present

## 2021-03-26 DIAGNOSIS — G4733 Obstructive sleep apnea (adult) (pediatric): Secondary | ICD-10-CM

## 2021-03-26 DIAGNOSIS — E662 Morbid (severe) obesity with alveolar hypoventilation: Secondary | ICD-10-CM | POA: Diagnosis not present

## 2021-03-26 DIAGNOSIS — M810 Age-related osteoporosis without current pathological fracture: Secondary | ICD-10-CM

## 2021-03-26 NOTE — Progress Notes (Signed)
  Bethany Cole - 70 y.o. female MRN [id number]  Date of birth: 1950/12/18   Patient is here for nurse visit Prolia for her osteoporosis. Patient received Prolia 60 mg SQ injection. Patient tolerated injection well and will return in 6 month for subsequent injection.

## 2021-03-26 NOTE — Patient Instructions (Signed)
Order- schedule home sleep test   Dx OSA  Please call us about 2 weeks after your sleep test for results and recommendations. Hopefully we will be able to get you a new DME , CPAP machine and supplies as soon as we have the test results.

## 2021-03-28 ENCOUNTER — Ambulatory Visit
Admission: RE | Admit: 2021-03-28 | Discharge: 2021-03-28 | Disposition: A | Discharge status: Home / Self Care | Payer: Medicare Other | Source: Ambulatory Visit | Attending: Internal Medicine | Admitting: Internal Medicine

## 2021-03-28 ENCOUNTER — Other Ambulatory Visit: Payer: Self-pay

## 2021-03-28 DIAGNOSIS — Z1231 Encounter for screening mammogram for malignant neoplasm of breast: Secondary | ICD-10-CM

## 2021-03-31 ENCOUNTER — Other Ambulatory Visit: Payer: Self-pay

## 2021-03-31 ENCOUNTER — Ambulatory Visit: Payer: Medicare Other | Attending: Family Medicine

## 2021-03-31 DIAGNOSIS — M25551 Pain in right hip: Secondary | ICD-10-CM | POA: Diagnosis not present

## 2021-03-31 DIAGNOSIS — M6281 Muscle weakness (generalized): Secondary | ICD-10-CM

## 2021-03-31 DIAGNOSIS — R42 Dizziness and giddiness: Secondary | ICD-10-CM | POA: Diagnosis not present

## 2021-03-31 DIAGNOSIS — R2681 Unsteadiness on feet: Secondary | ICD-10-CM | POA: Insufficient documentation

## 2021-03-31 DIAGNOSIS — Z9181 History of falling: Secondary | ICD-10-CM | POA: Diagnosis not present

## 2021-03-31 DIAGNOSIS — R2689 Other abnormalities of gait and mobility: Secondary | ICD-10-CM

## 2021-03-31 NOTE — Therapy (Signed)
[facility] [address]  [place], [place], [address] Phone: [phone]   Fax:  [phone]  Physical Therapy Treatment  Patient Details  Name: Bethany Cole MRN: [id number] Date of Birth: [birth year] Referring Provider (PT): Rosemarie Ax, MD   Encounter Date: 03/31/2021   PT End of Session - 03/31/21 1444    Visit Number 16    Number of Visits 29    Date for PT Re-Evaluation 05/12/21    Authorization Type [employer] medicare    Progress Note Due on Visit 23    PT Start Time 1402    PT Stop Time 1443    PT Time Calculation (min) 41 min    Activity Tolerance Patient tolerated treatment well    Behavior During Therapy [facility] for tasks assessed/performed           Past Medical History:  Diagnosis Date  . Cataracts, bilateral   . Gastritis   . GERD (gastroesophageal reflux disease)   . Hypertension   . Hyperthyroidism   . OSA on CPAP   . Squamous cell carcinoma of skin     Past Surgical History:  Procedure Laterality Date  . ABDOMINAL HYSTERECTOMY    . ENDOMETRIAL ABLATION    . HERNIA REPAIR     as an infant  . WRIST ARTHROSCOPY Right     There were no vitals filed for this visit.   Subjective Assessment - 03/31/21 1406    Subjective Pt reports that she was sore after less session but it was more muscular. Her hip started hurting afterwards, she believes that sitting for too long may aggrevate it.    Pertinent History History of 2 falls with fracture of right wrist 2020, right ankle 2018. Had PT after previous fractures. history of B shoulder pain (now more pain in R shoulder), neck pain with headaches in the past, Hypertension,Hyperthyroidism, osteoporosis, cataracts B    Patient Stated Goals improve stamina/endurance, improve stability and balance, improve strength, decrease pain    Currently in Pain? Yes    Pain Score 6     Pain Location Hip    Pain Orientation Right;Lateral    Pain Descriptors  / Indicators Aching;Burning    Pain Type Chronic pain                             [facility] Adult PT Treatment/Exercise - 03/31/21 0001      Exercises   Exercises Knee/Hip      Knee/Hip Exercises: Stretches   ITB Stretch Right;30 seconds    ITB Stretch Limitations manual stretch    Piriformis Stretch Right;30 seconds    Piriformis Stretch Limitations manual stretch      Knee/Hip Exercises: Aerobic   Recumbent Bike L4 x 6 min      Knee/Hip Exercises: Machines for Strengthening   Cybex Leg Press 20# 2x 10 reps      Knee/Hip Exercises: Standing   Hip Flexion Stengthening;Both;2 sets;10 reps;Knee straight    Hip Flexion Limitations 2# weights    Hip Abduction Stengthening;Both;2 sets;10 reps;Knee straight    Abduction Limitations 2# weights    Functional Squat 15 reps    Functional Squat Limitations heavy cueing for pushing hips back    Other Standing Knee Exercises sidestepping with red loop around knees 3x length of counter; monster walk fwd 2x along the counter with red loop above knees  PT Short Term Goals - 03/17/21 1325      PT SHORT TERM GOAL #1   Title Pt will be independent with initial HEP    Time 2    Period Weeks    Status Achieved    Target Date 12/16/20             PT Long Term Goals - 03/19/21 1450      PT LONG TERM GOAL #1   Title Pt will score at least 53/56 on berg to demo low fall risk    Time 8    Period Weeks    Status On-going   03/17/21 NT     PT LONG TERM GOAL #2   Title Pt will score at least 19/24 on DGI to demo decreased fall risk with community negotiation.    Time 8    Period Weeks    Status Achieved   22/24     PT LONG TERM GOAL #3   Title Pt will demo improved quality of eye movements without symptoms (no lightheaded, dizziness, or nausea) with VOR exercises to facilitate improved vestibular function for balance in community    Time 8    Period Weeks    Status On-going   NT- waiting  for ENT appointment     PT LONG TERM GOAL #4   Title Pt will be able to get up and down from the floor safely, with good control and no LOB, to demo improved functional strength    Time 8    Period Weeks    Status Achieved   reports tolerance of this activity with/without use of a chair     PT LONG TERM GOAL #5   Title Patient to improve ABC score to 90% to decrease risk of falls.    Baseline 86% 12/15/20    Time 8    Period Weeks    Status On-going   03/17/21: 61%     PT LONG TERM GOAL #6   Title Patient to score >/=4+/5 in B LE strength testing.    Time 8    Period Weeks    Status On-going      PT LONG TERM GOAL #7   Title Patient to report 80% improvement in R hip pain.    Time 8    Period Weeks    Status On-going                 Plan - 03/31/21 1445    Clinical Impression Statement Pt reports that she was sore after last session in her muscles but her hip started bothering her afterward. She believes that sitting aggrevates her pain. Educated her on possibly using ice if her hip flares up again, also trying stretches to relieve pain if this happens again. She required heavy cues for proper squat form today. Overall good form with the other exercises, only requiring mild instruction. R hip still is considerably weaker than the L during exercises and she demonstrates less R WS during gait.    Personal Factors and Comorbidities Comorbidity 2    Comorbidities History of 2 falls with fracture of right wrist 2020, right ankle 2018. Had PT after previous fractures. history of B shoulder pain (now more pain in R shoulder), neck pain with headaches in the past, Hypertension,Hyperthyroidism, osteoporosis, cataracts B    PT Frequency 2x / week    PT Duration 8 weeks    PT Treatment/Interventions ADLs/Self Care Home Management;Electrical Stimulation;Moist Heat;Cryotherapy;DME Instruction;Neuromuscular re-education;Balance training;Therapeutic  exercise;Therapeutic  activities;Functional mobility training;Stair training;Gait training;Patient/family education;Manual techniques;Energy conservation;Taping;Vestibular;Iontophoresis 4mg /ml Dexamethasone;Ultrasound;Vasopneumatic Device;Dry needling;Passive range of motion    PT Next Visit Plan Progress ankle and hip strengthening and stretching, balance    PT Home Exercise Plan See pt instructions    Consulted and Agree with Plan of Care Patient           Patient will benefit from skilled therapeutic intervention in order to improve the following deficits and impairments:  Abnormal gait,Decreased range of motion,Difficulty walking,Increased muscle spasms,Decreased endurance,Dizziness,Pain,Improper body mechanics,Impaired flexibility,Decreased balance,Decreased mobility,Decreased strength,Increased fascial restricitons,Postural dysfunction  Visit Diagnosis: Unsteadiness on feet  Muscle weakness (generalized)  Pain in right hip  Other abnormalities of gait and mobility     Problem List Patient Active Problem List   Diagnosis Date Noted  . Fall 11/17/2020  . Hyperlipemia 11/07/2020  . Osteoporosis 09/30/2020  . Hyperthyroidism   . Hypertension   . GERD (gastroesophageal reflux disease)   . Gastritis   . Cataracts, bilateral     Artist Pais, PTA 03/31/2021, 3:08 PM  [facility] [address]  [place], [place], [address] Phone: [phone]   Fax:  [phone]  Name: Maripaz Mullan MRN: [id number] Date of Birth: October 05, 1951

## 2021-04-01 ENCOUNTER — Encounter (INDEPENDENT_AMBULATORY_CARE_PROVIDER_SITE_OTHER): Payer: Self-pay | Admitting: Otolaryngology

## 2021-04-01 ENCOUNTER — Ambulatory Visit (INDEPENDENT_AMBULATORY_CARE_PROVIDER_SITE_OTHER): Payer: Medicare Other | Admitting: Otolaryngology

## 2021-04-01 VITALS — Temp 97.2°F

## 2021-04-01 DIAGNOSIS — H903 Sensorineural hearing loss, bilateral: Secondary | ICD-10-CM | POA: Diagnosis not present

## 2021-04-01 DIAGNOSIS — R42 Dizziness and giddiness: Secondary | ICD-10-CM

## 2021-04-01 NOTE — Progress Notes (Signed)
HPI: Bethany Cole is a 70 y.o. female who presents is referred by her PCP for evaluation of hearing and dizziness.  Patient has hearing aids that she got a couple of years ago.  She does not feel like she is hearing quite as well as she should.  She also describes of being off balance when she bends forwards.  And when she is on stairs she gets a little "lightheaded".  She is getting vestibular physical therapy.  She gives no history of being dizzy at night while in bed and no real true vertigo type symptoms. She just moved to [facility] a little over a year ago..  Past Medical History:  Diagnosis Date  . Cataracts, bilateral   . Gastritis   . GERD (gastroesophageal reflux disease)   . Hypertension   . Hyperthyroidism   . OSA on CPAP   . Squamous cell carcinoma of skin    Past Surgical History:  Procedure Laterality Date  . ABDOMINAL HYSTERECTOMY    . ENDOMETRIAL ABLATION    . HERNIA REPAIR     as an infant  . WRIST ARTHROSCOPY Right    Social History   Socioeconomic History  . Marital status: Divorced    Spouse name: Not on file  . Number of children: Not on file  . Years of education: Not on file  . Highest education level: Not on file  Occupational History  . Not on file  Tobacco Use  . Smoking status: Never Smoker  . Smokeless tobacco: Never Used  Vaping Use  . Vaping Use: Never used  Substance and Sexual Activity  . Alcohol use: Yes    Comment: occasional  . Drug use: Never  . Sexual activity: Not on file  Other Topics Concern  . Not on file  Social History Narrative  . Not on file   Social Determinants of Health   Financial Resource Strain: Not on file  Food Insecurity: Not on file  Transportation Needs: Not on file  Physical Activity: Not on file  Stress: Not on file  Social Connections: Not on file   Family History  Problem Relation Age of Onset  . Ovarian cancer Mother   . Stomach cancer Mother        mets from ovarian cancer  . Heart disease  Father   . Cancer - Other Father        laryngeal cancer  . Colon cancer Neg Hx   . Esophageal cancer Neg Hx   . Pancreatic cancer Neg Hx   . Liver disease Neg Hx    Allergies  Allergen Reactions  . Erythromycin   . Macrobid [Nitrofurantoin]   . Penicillins   . Sulfa Antibiotics    Prior to Admission medications   Medication Sig Start Date End Date Taking? Authorizing Provider  denosumab (PROLIA) 60 MG/ML SOSY injection Inject 60 mg into the skin every 6 (six) months.    [provider]  dicyclomine (BENTYL) 10 MG capsule Take 1 capsule (10 mg total) by mouth 4 (four) times daily -  before meals and at bedtime. 01/16/21   Willia Craze, NP  famotidine (PEPCID) 20 MG tablet Take 20 mg by mouth daily.    [provider]  irbesartan (AVAPRO) 75 MG tablet Take 1 tablet (75 mg total) by mouth daily. 03/20/21   Isaac Bliss, Rayford Halsted, MD  methimazole (TAPAZOLE) 5 MG tablet Take 0.5 tablets (2.5 mg total) by mouth daily. 12/03/20   Shamleffer, Melanie Crazier, MD  pantoprazole (PROTONIX) 40 MG tablet Take 1 tablet (40 mg total) by mouth 2 (two) times daily. 03/10/21   Nelida Meuse III, MD  RESTASIS 0.05 % ophthalmic emulsion 1 drop 2 (two) times daily. 07/20/20   [provider]     Positive ROS: Otherwise negative  All other systems have been reviewed and were otherwise negative with the exception of those mentioned in the HPI and as above.  Physical Exam: Constitutional: Alert, well-appearing, no acute distress.  Patient is having no vertigo and dizziness while in the office today. Ears: External ears without lesions or tenderness. Ear canals are clear bilaterally with intact, clear TMs bilaterally.  Minimal wax buildup that is nonobstructing. Nasal: External nose without lesions. Septum with minimal deformity and mild rhinitis.. Clear nasal passages. Oral: Lips and gums without lesions. Tongue and palate mucosa without lesions. Posterior oropharynx  clear. Neck: No palpable adenopathy or masses Respiratory: Breathing comfortably  Skin: No facial/neck lesions or rash noted.  Audiologic testing revealed a mild symmetric sensorineural hearing loss in both ears with SRT's of 20 dB bilaterally and type A tympanograms.  Procedures  Assessment: Mild bilateral symmetric sensorineural hearing loss. Dizziness questionable etiology.  No clinical evidence of vestibular abnormality with no evidence of BPPV or Mnire's disease.  Plan: She will follow-up with our audiologist next week concerning her hearing aids and possible hearing aid adjustment. Would recommend continued therapy with vestibular rehab PT concerning her sensation of off balance and dizziness.  No clinical evidence of significant vestibular abnormality.  If dizziness worsens would consider further evaluation with neurology.   Radene Journey, MD   CC:

## 2021-04-06 ENCOUNTER — Encounter (INDEPENDENT_AMBULATORY_CARE_PROVIDER_SITE_OTHER): Payer: Self-pay

## 2021-04-07 ENCOUNTER — Encounter: Payer: Medicare Other | Admitting: Physical Therapy

## 2021-04-08 ENCOUNTER — Encounter: Payer: Self-pay | Admitting: Internal Medicine

## 2021-04-08 ENCOUNTER — Other Ambulatory Visit: Payer: Self-pay

## 2021-04-08 ENCOUNTER — Ambulatory Visit (INDEPENDENT_AMBULATORY_CARE_PROVIDER_SITE_OTHER): Payer: Medicare Other | Admitting: Internal Medicine

## 2021-04-08 VITALS — BP 138/60 | HR 68 | Ht 63.0 in | Wt 163.0 lb

## 2021-04-08 DIAGNOSIS — E0590 Thyrotoxicosis, unspecified without thyrotoxic crisis or storm: Secondary | ICD-10-CM | POA: Diagnosis not present

## 2021-04-08 LAB — TSH: TSH: 3.57 u[IU]/mL (ref 0.35–4.50)

## 2021-04-08 LAB — T4, FREE: Free T4: 0.91 ng/dL (ref 0.60–1.60)

## 2021-04-08 NOTE — Progress Notes (Signed)
Name: Bethany Cole  MRN/ DOB: [id number], 16-Mar-1951    Age/ Sex: 70 y.o., female     PCP: Isaac Bliss, Rayford Halsted, MD   Reason for Endocrinology Evaluation: Hyperthyroidism     Initial Endocrinology Clinic Visit: 12/03/2020    PATIENT IDENTIFIER: [name] is a 70 y.o., female with a past medical history of HTN, GERD and Hyperthyroidism. She has followed with [facility] since 12/03/2020 for consultative assistance with management of her Hyperthyroidism.    Moved from [place] 04/2020  HISTORICAL SUMMARY:   She has been diagnosed with hyperthyroidism in 11/2019 during routine work up with a TSH of 0.15 uIU/L , FT4 1.5 ng/dL ( 0.7-1.5 ng/dL) and an elevated T3 4.30 pg/mL ( 1.7-3.7) . She had worsening anxiety and tremors at the time , this was attributed to [place]  On her initial visit at our clinic she was on methimazole 2.5 mg daily    No FH of thyroid disease  SUBJECTIVE:     Today (04/08/2021):  [name] is here for hyperthyroidism.   She has been noted with weight loss  Denies fever  Has chronic abdominal pain and changes in BM due to IBS   Denies palpitation  Denies tremors  Has dry eyes and uses Restasis  Has been more tired then usual   methimazole 5 mg, Half a tablet  Daily   HISTORY:  Past Medical History:  Past Medical History:  Diagnosis Date   Cataracts, bilateral    Gastritis    GERD (gastroesophageal reflux disease)    Hypertension    Hyperthyroidism    OSA on CPAP    Squamous cell carcinoma of skin    Past Surgical History:  Past Surgical History:  Procedure Laterality Date   ABDOMINAL HYSTERECTOMY     ENDOMETRIAL ABLATION     HERNIA REPAIR     as an infant   WRIST ARTHROSCOPY Right    Social History:  reports that she has never smoked. She has never used smokeless tobacco. She reports current alcohol use. She reports that she does not use drugs. Family History:  Family History  Problem Relation Age  of Onset   Ovarian cancer Mother    Stomach cancer Mother        mets from ovarian cancer   Heart disease Father    Cancer - Other Father        laryngeal cancer   Colon cancer Neg Hx    Esophageal cancer Neg Hx    Pancreatic cancer Neg Hx    Liver disease Neg Hx      HOME MEDICATIONS: Allergies as of 04/08/2021       Reactions   Erythromycin    Macrobid [nitrofurantoin]    Penicillins    Sulfa Antibiotics         Medication List        Accurate as of April 08, 2021 12:40 PM. If you have any questions, ask your nurse or doctor.          denosumab 60 MG/ML Sosy injection Commonly known as: PROLIA Inject 60 mg into the skin every 6 (six) months.   dicyclomine 10 MG capsule Commonly known as: BENTYL Take 1 capsule (10 mg total) by mouth 4 (four) times daily -  before meals and at bedtime.   famotidine 20 MG tablet Commonly known as: PEPCID Take 20 mg by mouth daily.   irbesartan 75 MG tablet Commonly known as: AVAPRO Take 1 tablet (  75 mg total) by mouth daily.   methimazole 5 MG tablet Commonly known as: TAPAZOLE Take 0.5 tablets (2.5 mg total) by mouth daily.   pantoprazole 40 MG tablet Commonly known as: PROTONIX Take 1 tablet (40 mg total) by mouth 2 (two) times daily.   Restasis 0.05 % ophthalmic emulsion Generic drug: cycloSPORINE 1 drop 2 (two) times daily.          OBJECTIVE:   PHYSICAL EXAM: VS: BP 138/60   Pulse 68   Ht 5\' 3"  (1.6 m)   Wt 163 lb (73.9 kg)   SpO2 98%   BMI 28.87 kg/m    EXAM: General: Pt appears well and is in NAD  Neck: General: Supple without adenopathy. Thyroid: Thyroid size normal.  No goiter or nodules appreciated.   Lungs: Clear with good BS bilat with no rales, rhonchi, or wheezes  Heart: Auscultation: RRR.  Abdomen: Normoactive bowel sounds, soft, nontender, without masses or organomegaly palpable  Extremities: Gait and station: Normal gait  Digits and nails: No clubbing, cyanosis, petechiae, or  nodes Head and neck: Normal alignment and mobility BL UE: Normal ROM and strength. BL LE: No pretibial edema normal ROM and strength.  Mental Status: Judgment, insight: Intact Mood and affect: No depression, anxiety, or agitation     DATA REVIEWED:  Results for HENRETTA, QUIST (MRN [id number]) as of 04/09/2021 07:59  Ref. Range 04/08/2021 13:44  TSH Latest Ref Range: 0.35 - 4.50 uIU/mL 3.57  T4,Free(Direct) Latest Ref Range: 0.60 - 1.60 ng/dL 0.91   Results for Cole, Bethany (MRN [id number]) as of 04/09/2021 07:59  Ref. Range 12/03/2020 09:39  TRAB Latest Ref Range: <=2.00 IU/L 1.02    ASSESSMENT / PLAN / RECOMMENDATIONS:   Hyperthyroidism :    - This has been attributed to Graves' disease through a prior diagnosis. TRAB here was detectable but not elevated  at 1.02 IU/L but she already has been on methimazole at the time  - No local neck symptoms  - TFT's are normal, no change today   Medications   Continue Methimazole 5 mg , HALF a tablet daily   F/U in 4 months  Labs in 2 months     Signed electronically by: Mack Guise, MD  [facility]  [address] [place], [place] [address] Phone: [phone] FAX: [phone]      CC: Isaac Bliss, Rayford Halsted, MD [place] [address] Phone: [phone]  Fax: [phone]   Return to Endocrinology clinic as below: Future Appointments  Date Time Provider [place]  04/08/2021  1:20 PM Chonita Gadea, Melanie Crazier, MD [facility] None  04/09/2021  2:45 PM Artist Pais, PTA [facility]  04/29/2021  9:30 AM Gregor Hams, MD [facility] None  07/27/2021 10:30 AM Deneise Lever, MD [facility] None  02/16/2022 10:15 AM Warren Danes, PA-C [facility]

## 2021-04-09 ENCOUNTER — Ambulatory Visit: Payer: Medicare Other

## 2021-04-09 DIAGNOSIS — R2689 Other abnormalities of gait and mobility: Secondary | ICD-10-CM

## 2021-04-09 DIAGNOSIS — M25551 Pain in right hip: Secondary | ICD-10-CM | POA: Diagnosis not present

## 2021-04-09 DIAGNOSIS — R42 Dizziness and giddiness: Secondary | ICD-10-CM | POA: Diagnosis not present

## 2021-04-09 DIAGNOSIS — Z9181 History of falling: Secondary | ICD-10-CM | POA: Diagnosis not present

## 2021-04-09 DIAGNOSIS — M6281 Muscle weakness (generalized): Secondary | ICD-10-CM | POA: Diagnosis not present

## 2021-04-09 DIAGNOSIS — R2681 Unsteadiness on feet: Secondary | ICD-10-CM

## 2021-04-09 NOTE — Therapy (Addendum)
[facility] [address]  [place], [place], [address] Phone: [phone]   Fax:  [phone]  Physical Therapy Treatment  Patient Details  Name: Bethany Cole MRN: [id number] Date of Birth: 07-18-1951 Referring Provider (PT): Rosemarie Ax, MD  Progress Note Reporting Period 03/19/21 to 04/09/21  See note below for Objective Data and Assessment of Progress/Goals.     Encounter Date: 04/09/2021   PT End of Session - 04/09/21 1527     Visit Number 17    Number of Visits 29    Date for PT Re-Evaluation 05/12/21    Authorization Type [employer] medicare    Progress Note Due on Visit 23    PT Start Time 1445    PT Stop Time 1529    PT Time Calculation (min) 44 min    Activity Tolerance Patient tolerated treatment well    Behavior During Therapy WFL for tasks assessed/performed             Past Medical History:  Diagnosis Date   Cataracts, bilateral    Gastritis    GERD (gastroesophageal reflux disease)    Hypertension    Hyperthyroidism    OSA on CPAP    Squamous cell carcinoma of skin     Past Surgical History:  Procedure Laterality Date   ABDOMINAL HYSTERECTOMY     ENDOMETRIAL ABLATION     HERNIA REPAIR     as an infant   WRIST ARTHROSCOPY Right     There were no vitals filed for this visit.   Subjective Assessment - 04/09/21 1447     Subjective Hip pain comes and goes on different days, stretching helps when the hip begans to hurt. Pt reports that she saw the ENT and reports no vestibular issues. Less dizziness.    Pertinent History History of 2 falls with fracture of right wrist 2020, right ankle 2018. Had PT after previous fractures. history of B shoulder pain (now more pain in R shoulder), neck pain with headaches in the past, Hypertension,Hyperthyroidism, osteoporosis, cataracts B    Patient Stated Goals improve stamina/endurance, improve stability and balance, improve strength,  decrease pain    Currently in Pain? Yes    Pain Score 5     Pain Location Hip    Pain Orientation Right;Lateral    Pain Descriptors / Indicators Aching;Burning    Pain Type Chronic pain                [facility] PT Assessment - 04/09/21 0001       Strength   Right Hip Flexion 4/5    Right Hip ABduction 4/5    Right Hip ADduction 4-/5    Left Hip Flexion 4/5    Left Hip ABduction 4/5    Left Hip ADduction 4/5    Right Knee Flexion 4/5    Right Knee Extension 4/5    Left Knee Flexion 4/5    Left Knee Extension 4/5    Right Ankle Inversion 4+/5    Right Ankle Eversion 4/5    Left Ankle Dorsiflexion 4+/5    Left Ankle Inversion 4+/5    Left Ankle Eversion 4+/5                           [facility] Adult PT Treatment/Exercise - 04/09/21 0001       Exercises   Exercises Knee/Hip      Knee/Hip Exercises: Aerobic  Recumbent Bike L2x 6 min      Knee/Hip Exercises: Seated   Other Seated Knee/Hip Exercises mini sumo squat w/o weight 5 reps    Sit to Sand 10 reps;without UE support   with yellow medicine ball     Knee/Hip Exercises: Supine   Short Arc Quad Sets Strengthening;Both;10 reps    Short Arc Quad Sets Limitations with ball squeezing; trouble with eccentric control    Bridges with [employer] Strengthening;Both;10 reps    Bridges with Clamshell Strengthening;Both;10 reps   red TB above knees   Other Supine Knee/Hip Exercises clamshells red TB S/L fallout R LE 10 reps      Knee/Hip Exercises: Sidelying   Other Sidelying Knee/Hip Exercises B fire hydrants with red TB 10 reps                      PT Short Term Goals - 03/17/21 1325       PT SHORT TERM GOAL #1   Title Pt will be independent with initial HEP    Time 2    Period Weeks    Status Achieved    Target Date 12/16/20               PT Long Term Goals - 04/09/21 1458       PT LONG TERM GOAL #1   Title Pt will score at least 53/56 on berg to demo low fall risk    Time  8    Period Weeks    Status On-going   03/17/21 NT     PT LONG TERM GOAL #2   Title Pt will score at least 19/24 on DGI to demo decreased fall risk with community negotiation.    Time 8    Period Weeks    Status Achieved   22/24     PT LONG TERM GOAL #3   Title Pt will demo improved quality of eye movements without symptoms (no lightheaded, dizziness, or nausea) with VOR exercises to facilitate improved vestibular function for balance in community    Time 8    Period Weeks    Status On-going   NT- waiting for ENT appointment     PT LONG TERM GOAL #4   Title Pt will be able to get up and down from the floor safely, with good control and no LOB, to demo improved functional strength    Time 8    Period Weeks    Status Achieved   reports tolerance of this activity with/without use of a chair     PT LONG TERM GOAL #5   Title Patient to improve ABC score to 90% to decrease risk of falls.    Baseline 86% 12/15/20    Time 8    Period Weeks    Status On-going   03/17/21: 61%     PT LONG TERM GOAL #6   Title Patient to score >/=4+/5 in B LE strength testing.    Time 8    Period Weeks    Status On-going   still global hip weakness     PT LONG TERM GOAL #7   Title Patient to report 80% improvement in R hip pain.    Time 8    Period Weeks    Status On-going                   Plan - 04/09/21 1531     Clinical Impression Statement Strength testing showed continued  global hip and knee weakness today but improvement since the last time she was tested. Pt inquired about getting in the pool doing exercises and I encouraged her to do aquatic strengthening with her HEP exercises. She continues to tolerate ther ex well w/o complaints of pain but demonstrates LE weakness. She required instruction to ensure proper hip position during the sidelying exercises to isolate the correct muscles. She also required instruction for proper posterior weight shift during the sumo squat and completed  these with reduced ROM due to weakness. Pt will be out of town for 2 weeks, so reminded her on the importance of compliance with HEP for max carryover.    Personal Factors and Comorbidities Comorbidity 2    Comorbidities History of 2 falls with fracture of right wrist 2020, right ankle 2018. Had PT after previous fractures. history of B shoulder pain (now more pain in R shoulder), neck pain with headaches in the past, Hypertension,Hyperthyroidism, osteoporosis, cataracts B    PT Frequency 2x / week    PT Duration 8 weeks    PT Treatment/Interventions ADLs/Self Care Home Management;Electrical Stimulation;Moist Heat;Cryotherapy;DME Instruction;Neuromuscular re-education;Balance training;Therapeutic exercise;Therapeutic activities;Functional mobility training;Stair training;Gait training;Patient/family education;Manual techniques;Energy conservation;Taping;Vestibular;Iontophoresis 4mg /ml Dexamethasone;Ultrasound;Vasopneumatic Device;Dry needling;Passive range of motion    PT Next Visit Plan Progress ankle and hip strengthening and stretching, balance    PT Home Exercise Plan See pt instructions    Consulted and Agree with Plan of Care Patient             Patient will benefit from skilled therapeutic intervention in order to improve the following deficits and impairments:  Abnormal gait, Decreased range of motion, Difficulty walking, Increased muscle spasms, Decreased endurance, Dizziness, Pain, Improper body mechanics, Impaired flexibility, Decreased balance, Decreased mobility, Decreased strength, Increased fascial restricitons, Postural dysfunction  Visit Diagnosis: Unsteadiness on feet  Muscle weakness (generalized)  Pain in right hip  Other abnormalities of gait and mobility  Dizziness and giddiness  History of falling     Problem List Patient Active Problem List   Diagnosis Date Noted   Fall 11/17/2020   Hyperlipemia 11/07/2020   Osteoporosis 09/30/2020   Hyperthyroidism     Hypertension    GERD (gastroesophageal reflux disease)    Gastritis    Cataracts, bilateral     Artist Pais, PTA 04/09/2021, 4:38 PM  [facility] [address]  [place], [place], [address] Phone: [phone]   Fax:  [phone]  Name: Bethany Cole MRN: [id number] Date of Birth: 07-01-1951  PHYSICAL THERAPY DISCHARGE SUMMARY  Visits from Start of Care: 17  Current functional level related to goals / functional outcomes: Unable to assess; patient did not return   Remaining deficits: See above   Education / Equipment: HEP  Plan: Patient agrees to discharge.  Patient goals were partially met. Patient is being discharged due to not returning to PT.    Janene Harvey, PT, DPT 06/10/21 1:00 PM

## 2021-04-10 ENCOUNTER — Encounter: Payer: Self-pay | Admitting: Internal Medicine

## 2021-04-10 DIAGNOSIS — E662 Morbid (severe) obesity with alveolar hypoventilation: Secondary | ICD-10-CM | POA: Insufficient documentation

## 2021-04-10 DIAGNOSIS — G4733 Obstructive sleep apnea (adult) (pediatric): Secondary | ICD-10-CM | POA: Insufficient documentation

## 2021-04-10 NOTE — Assessment & Plan Note (Addendum)
She benefits from CPAP and has been compliant. Will need to update sleep study for insurance support. Continue CPAP 11 with her machine. Plan- HST, then replace old CPAP, change to auto 5-15,  get supplies and establish with local DME

## 2021-04-10 NOTE — Assessment & Plan Note (Signed)
Seek normal weight with dieet and exercise. Consider referral to [facility].

## 2021-04-13 ENCOUNTER — Encounter: Payer: Self-pay | Admitting: Internal Medicine

## 2021-04-15 ENCOUNTER — Ambulatory Visit: Payer: Medicare Other

## 2021-04-21 DIAGNOSIS — J019 Acute sinusitis, unspecified: Secondary | ICD-10-CM | POA: Diagnosis not present

## 2021-04-23 NOTE — Progress Notes (Signed)
Rito Ehrlich, am serving as a Education administrator for Dr. [name].  Bethany Cole is a 70 y.o. female who presents to [place] at [facility] today for f/u of R lateral hip pain.  She was last seen by Dr. Georgina Snell on 03/09/21 and was shown a HEP and referred to PT.  She has completed 17 PT sessions for her R  hip and prior ankle and balance dysfunction.  She was also advised to use Vit D3 and Calcium.  Since her last visit w/ Dr. Georgina Snell, pt reports not a lot of pain during the day, but at night the pain will keep her up. Seems to not be able to figure out what to do for the pain at night so she can sleep.  PT has helped patient with activities but has not helped with that night pain.    Pertinent review of systems: No fevers or chills  Relevant historical information: Hypertension.  Osteoporosis.   Exam:  BP 122/84 (BP Location: Left Arm, Patient Position: Sitting, Cuff Size: Normal)   Pulse 84   Ht 5\' 3"  (1.6 m)   Wt 161 lb (73 kg)   SpO2 99%   BMI 28.52 kg/m  General: Well Developed, well nourished, and in no acute distress.   MSK: Right hip: Normal-appearing Normal motion. Tender palpation greater trochanter. Hip abduction strength 4+/5 External rotation strength 4+/5 Normal gait   Lab and Radiology Results  X-ray images right hip obtained today personally and independently interpreted. Tiny osteophyte superior portion of greater trochanter.  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  No acute fractures. Await formal radiology review     Assessment and Plan: 70 y.o. female with right lateral hip pain ongoing for greater than 6 weeks.  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  She has had 17 physical therapy sessions and has improved her strength and some of her pain but still is having bothersome right lateral hip pain at night interfering with sleep and activities.  At this point plan to proceed with MRI to  further characterize cause of pain and for potential injection or surgical planning.  Recheck after MRI.   PDMP not reviewed this encounter. Orders Placed This Encounter  Procedures   DG HIP UNILAT WITH PELVIS 2-3 VIEWS RIGHT    Standing Status:   Future    Number of Occurrences:   1    Standing Expiration Date:   04/29/2022    Order Specific Question:   Reason for Exam (SYMPTOM  OR DIAGNOSIS REQUIRED)    Answer:   eval hip pain r    Order Specific Question:   Preferred imaging location?    Answer:   [facility]   MR HIP RIGHT WO CONTRAST    Standing Status:   Future    Standing Expiration Date:   04/29/2022    Order Specific Question:   What is the patient's sedation requirement?    Answer:   No Sedation    Order Specific Question:   Does the patient have a pacemaker or implanted devices?    Answer:   No    Order Specific Question:   Preferred imaging location?    Answer:   Product/process development scientist (table limit-350lbs)   No orders of the defined types were placed in this encounter.    Discussed warning signs or symptoms. Please see discharge instructions. Patient expresses understanding.   The above documentation has been  reviewed and is accurate and complete [name], M.D.

## 2021-04-29 ENCOUNTER — Ambulatory Visit: Payer: Medicare Other

## 2021-04-29 ENCOUNTER — Other Ambulatory Visit: Payer: Self-pay

## 2021-04-29 ENCOUNTER — Ambulatory Visit (INDEPENDENT_AMBULATORY_CARE_PROVIDER_SITE_OTHER): Payer: Medicare Other

## 2021-04-29 ENCOUNTER — Encounter: Payer: Self-pay | Admitting: Family Medicine

## 2021-04-29 ENCOUNTER — Ambulatory Visit: Payer: Medicare Other | Admitting: Family Medicine

## 2021-04-29 VITALS — BP 122/84 | HR 84 | Ht 63.0 in | Wt 161.0 lb

## 2021-04-29 DIAGNOSIS — G4733 Obstructive sleep apnea (adult) (pediatric): Secondary | ICD-10-CM

## 2021-04-29 DIAGNOSIS — M25551 Pain in right hip: Secondary | ICD-10-CM

## 2021-04-29 DIAGNOSIS — M7061 Trochanteric bursitis, right hip: Secondary | ICD-10-CM | POA: Diagnosis not present

## 2021-04-29 NOTE — Patient Instructions (Signed)
Thank you for coming in today.   Please get an Xray today before you leave   You should hear from MRI scheduling within 1 week. If you do not hear please let me know.    Recheck after the MRI.

## 2021-04-30 NOTE — Progress Notes (Signed)
Right hip x-ray does show some mild arthritis

## 2021-04-30 NOTE — Telephone Encounter (Signed)
Pt received Prolia inj 03/26/21 Next due 09/26/21

## 2021-04-30 NOTE — Telephone Encounter (Signed)
Out-of-pocket cost due at time of visit: $285  Primary: [employer] Medicare Prolia co-insurance: 20% ($255) Admin fee co-insurance: $30  Secondary: n/a Prolia co-insurance:  Admin fee co-insurance:   Deductible: does not apply  Prior Auth: not required PA# Valid:

## 2021-05-03 ENCOUNTER — Other Ambulatory Visit: Payer: Self-pay

## 2021-05-03 ENCOUNTER — Ambulatory Visit (INDEPENDENT_AMBULATORY_CARE_PROVIDER_SITE_OTHER): Payer: Medicare Other

## 2021-05-03 DIAGNOSIS — M7061 Trochanteric bursitis, right hip: Secondary | ICD-10-CM | POA: Diagnosis not present

## 2021-05-03 DIAGNOSIS — M25551 Pain in right hip: Secondary | ICD-10-CM | POA: Diagnosis not present

## 2021-05-03 DIAGNOSIS — M1611 Unilateral primary osteoarthritis, right hip: Secondary | ICD-10-CM | POA: Diagnosis not present

## 2021-05-04 ENCOUNTER — Other Ambulatory Visit: Payer: Self-pay | Admitting: Family Medicine

## 2021-05-04 ENCOUNTER — Encounter: Payer: Self-pay | Admitting: Family Medicine

## 2021-05-04 ENCOUNTER — Telehealth (INDEPENDENT_AMBULATORY_CARE_PROVIDER_SITE_OTHER): Payer: Medicare Other | Admitting: Family Medicine

## 2021-05-04 VITALS — Ht 63.0 in

## 2021-05-04 DIAGNOSIS — J309 Allergic rhinitis, unspecified: Secondary | ICD-10-CM

## 2021-05-04 DIAGNOSIS — J019 Acute sinusitis, unspecified: Secondary | ICD-10-CM

## 2021-05-04 DIAGNOSIS — J069 Acute upper respiratory infection, unspecified: Secondary | ICD-10-CM | POA: Diagnosis not present

## 2021-05-04 MED ORDER — FLUTICASONE PROPIONATE 50 MCG/ACT NA SUSP: 1.0000 | Freq: Two times a day (BID) | NASAL | 0 refills | Status: AC

## 2021-05-04 MED ORDER — CEFIXIME 400 MG PO CAPS
400.0000 mg | ORAL_CAPSULE | Freq: Every day | ORAL | 0 refills | Status: AC
Start: 2021-05-04 — End: 2021-05-11

## 2021-05-04 NOTE — Progress Notes (Signed)
Virtual Visit via Video Note I connected with Bethany Cole on 05/04/21 by a video enabled telemedicine application and verified that I am speaking with the correct person using two identifiers.  Location patient: home Location provider:work office Persons participating in the virtual visit: patient, provider  I discussed the limitations of evaluation and management by telemedicine and the availability of in person appointments. The patient expressed understanding and agreed to proceed.  Chief Complaint  Patient presents with   Sore Throat   Headache   Sinus Problem   HPI: Bethany Cole is a 70 yo female with hx of hypertension, hyperlipidemia, hypothyroidism, OSA on CPAP, and GERD complaining of 2 weeks of respiratory symptoms, she thinks it is a sinus infection. She was evaluated in urgent care when symptoms first started, she was in [place], she was treated with course of prednisone. Fatigue, right-sided frontal pressure headache, nasal congestion, rhinorrhea, right-sided facial pain, intermittent left ear fullness sensation (feels"blocked"), and mostly non productive cough. Occasionally she has small amount of sputum, no hemoptysis. Symptoms seem to be getting worse.  Intermittent episodes of fever, last time yesterday, 100 F. Sore throat has improved. Negative for visual changes, anosmia,ageusia,earache,dysphagia, stridor, CP, dyspnea, wheezing, palpitations, abdominal pain, nausea, vomiting, changes in bowel habits, urinary symptoms, or skin rash.  Reporting a negative COVID-19 test, yesterday. Negative for sick contact. COVID-19 vaccination completed + boosters x2. She moved to this area bout a year ago and has had some seasonal allergies but current symptoms are "different." She has taking OTC loratadine. She has not taking antipyretic medications.  ROS: See pertinent positives and negatives per HPI.  Past Medical History:  Diagnosis Date   Cataracts, bilateral    Gastritis     GERD (gastroesophageal reflux disease)    Hypertension    Hyperthyroidism    OSA on CPAP    Squamous cell carcinoma of skin    Past Surgical History:  Procedure Laterality Date   ABDOMINAL HYSTERECTOMY     ENDOMETRIAL ABLATION     HERNIA REPAIR     as an infant   WRIST ARTHROSCOPY Right    Family History  Problem Relation Age of Onset   Ovarian cancer Mother    Stomach cancer Mother        mets from ovarian cancer   Heart disease Father    Cancer - Other Father        laryngeal cancer   Colon cancer Neg Hx    Esophageal cancer Neg Hx    Pancreatic cancer Neg Hx    Liver disease Neg Hx     Social History   Socioeconomic History   Marital status: Divorced    Spouse name: Not on file   Number of children: Not on file   Years of education: Not on file   Highest education level: Not on file  Occupational History   Not on file  Tobacco Use   Smoking status: Never   Smokeless tobacco: Never  Vaping Use   Vaping Use: Never used  Substance and Sexual Activity   Alcohol use: Yes    Comment: occasional   Drug use: Never   Sexual activity: Not on file  Other Topics Concern   Not on file  Social History Narrative   Not on file   Social Determinants of Health   Financial Resource Strain: Not on file  Food Insecurity: Not on file  Transportation Needs: Not on file  Physical Activity: Not on file  Stress: Not on file  Social Connections: Not on file  Intimate Partner Violence: Not on file   Current Outpatient Medications:    denosumab (PROLIA) 60 MG/ML SOSY injection, Inject 60 mg into the skin every 6 (six) months., Disp: , Rfl:    dicyclomine (BENTYL) 10 MG capsule, Take 1 capsule (10 mg total) by mouth 4 (four) times daily -  before meals and at bedtime., Disp: 40 capsule, Rfl: 2   famotidine (PEPCID) 20 MG tablet, Take 20 mg by mouth daily., Disp: , Rfl:    irbesartan (AVAPRO) 75 MG tablet, Take 1 tablet (75 mg total) by mouth daily., Disp: 90 tablet, Rfl: 1    methimazole (TAPAZOLE) 5 MG tablet, Take 0.5 tablets (2.5 mg total) by mouth daily., Disp: 45 tablet, Rfl: 1   pantoprazole (PROTONIX) 40 MG tablet, Take 1 tablet (40 mg total) by mouth 2 (two) times daily., Disp: 180 tablet, Rfl: 1   RESTASIS 0.05 % ophthalmic emulsion, 1 drop 2 (two) times daily., Disp: , Rfl:   EXAM:  VITALS per patient if applicable:Ht 5\' 3"  (1.6 m)   BMI 28.52 kg/m   GENERAL: alert, oriented, appears well and in no acute distress  HEENT: atraumatic, conjunctiva clear, no obvious abnormalities on inspection of external nose and ears  NECK: normal movements of the head and neck  LUNGS: on inspection no signs of respiratory distress, breathing rate appears normal, no obvious gross SOB, gasping or wheezing  CV: no obvious cyanosis  MS: moves all visible extremities without noticeable abnormality  PSYCH/NEURO: pleasant and cooperative, no obvious depression or anxiety, speech and thought processing grossly intact  ASSESSMENT AND PLAN:  Discussed the following assessment and plan:  Acute sinusitis, recurrence not specified, unspecified location - Plan: cefixime (SUPRAX) 400 MG CAPS capsule Differential dx's discussed. She has multiple antibiotic allergies. She has taken cephalosporins in the past. She agrees with Suprax 400 mg daily x7 days. If problem is not greatly improved in 3 to 4 days, recommend arranging appointment with PCP. She may need sinus CT if symptoms are persistent.  Allergic rhinitis, unspecified seasonality, unspecified trigger - Plan: fluticasone (FLONASE) 50 MCG/ACT nasal spray This problem could be aggravating symptoms. Flonase nasal spray daily for 1 to 2 weeks then as needed and nasal saline irrigations as needed. Continue loratadine 10 mg daily.  Upper respiratory tract infection, unspecified type Symptomatic treatment: Plenty of p.o. fluids and Tylenol 500 mg 3-4 times per day if needed. Also recommended repeating home COVID-19 test  tomorrow. Monitor for new symptoms.  We discussed possible serious and likely etiologies, options for evaluation and workup, limitations of telemedicine visit vs in person visit, treatment, treatment risks and precautions.  I discussed the assessment and treatment plan with the patient. Bethany Cole was provided an opportunity to ask questions and all were answered. She agreed with the plan and demonstrated an understanding of the instructions.  Return if symptoms worsen or fail to improve.   Sade Hollon Martinique, MD

## 2021-05-05 NOTE — Progress Notes (Signed)
MRI hip shows some mild right-sided gluteus tendinosis which is radiologist for tendinitis.  This is probably the source of pain.  We certainly could try doing a steroid injection in this area.  There is no clear smoking gun.  We also see some arthritis in the lower portion of the low back a bit on the MRI that could cause low back pain but probably not lateral hip pain.  Most likely explanation is tendinosis/tendinitis. Recommend schedule visit to go over the results in full detail as well as potentially proceed with injection.

## 2021-05-07 DIAGNOSIS — G4733 Obstructive sleep apnea (adult) (pediatric): Secondary | ICD-10-CM | POA: Diagnosis not present

## 2021-05-14 ENCOUNTER — Telehealth: Payer: Self-pay | Admitting: Internal Medicine

## 2021-05-14 DIAGNOSIS — G4733 Obstructive sleep apnea (adult) (pediatric): Secondary | ICD-10-CM

## 2021-05-14 NOTE — Telephone Encounter (Signed)
Dr Annamaria Boots, please advise on home sleep study results, thanks!

## 2021-05-15 NOTE — Telephone Encounter (Signed)
Tried calling the pt Someone answered the phone and did not speak  Will try calling back later

## 2021-05-15 NOTE — Telephone Encounter (Signed)
Home sleep test confirmed moderate obstructive sleep apnea averaging 17.8 apneas/ hour with drops in blood oxygen level. Please order new DME, replace old CPAP machine, auto 5-15, mask of choice, humidifier, supplies, airview/ card. Ok to replace mask and head gear now, while waiting on machine availability.

## 2021-05-15 NOTE — Telephone Encounter (Signed)
Pt calling back regarding hst results. Pt can be reached at [phone]

## 2021-05-15 NOTE — Telephone Encounter (Signed)
Called and spoke with patient. I explained to her that as soon as we hear back from Dr. Annamaria Cole, we will call her back. She verbalized understanding.

## 2021-05-18 NOTE — Telephone Encounter (Signed)
Tried calling pt and there was no answer, LMTCB

## 2021-05-19 NOTE — Telephone Encounter (Signed)
Called and spoke to pt. Informed her of the results and recs per CY. Order placed for replacement CPAP and supplies needed now. Pt verbalized understanding and denied any further questions or concerns at this time.

## 2021-06-08 ENCOUNTER — Other Ambulatory Visit (INDEPENDENT_AMBULATORY_CARE_PROVIDER_SITE_OTHER): Payer: Medicare Other

## 2021-06-08 ENCOUNTER — Other Ambulatory Visit: Payer: Self-pay

## 2021-06-08 DIAGNOSIS — E0590 Thyrotoxicosis, unspecified without thyrotoxic crisis or storm: Secondary | ICD-10-CM

## 2021-06-08 LAB — T4, FREE: Free T4: 0.99 ng/dL (ref 0.60–1.60)

## 2021-06-08 LAB — TSH: TSH: 3.25 u[IU]/mL (ref 0.35–5.50)

## 2021-06-10 DIAGNOSIS — G4733 Obstructive sleep apnea (adult) (pediatric): Secondary | ICD-10-CM | POA: Diagnosis not present

## 2021-06-10 DIAGNOSIS — I10 Essential (primary) hypertension: Secondary | ICD-10-CM | POA: Diagnosis not present

## 2021-06-18 DIAGNOSIS — G4733 Obstructive sleep apnea (adult) (pediatric): Secondary | ICD-10-CM | POA: Diagnosis not present

## 2021-06-18 DIAGNOSIS — I10 Essential (primary) hypertension: Secondary | ICD-10-CM | POA: Diagnosis not present

## 2021-07-25 NOTE — Progress Notes (Signed)
03/26/21- 70 yoF never smoker for sleep evaluation with concern of OSA on CPAP Medical problem list includes HTN, Hypercholesterolemia, Hyperthyroid,  Epworth score- 7.5 Body weight today-163 lbs Covid vax- 4 Moderna -----Pt is being referred for a sleep consult. States she is currently on CPAP. Pt just moved here from [place] so is needing to get established with a new DME. Has used CPAP x 15 years- original study not available. Moved here in past year. Still using CPAP compliantly but mask worn out. Thinks fixed pressure 11. CPAP is big help. Sleeps alone.  Not aware of heart or lung disease.  Some nasal congesstion  07/27/21- 70 yoF never smoker followed for OSA, complicated by HTN,Hypercholesterolemia, Hyperthyroid,  HST 04/29/21- AHI/ 17.8/ hr, desaturation to 88%, body weight 163 lbs CPAP ordered 05/19/21- auto 5-15/ [facility] This was revalidation to establish new machine and DME after move from [place]. Download- Body weight today-166 lbs Covid vax-4 Moderna Flu vax-had Using an old fixed pressure machine and still waiting for replacement we ordered in July. Some daytime sleepiness which seems new to her.  Taking occasional nap.  Tylenol at bedtime helps sleep quality.  ROS-see HPI   + = positive Constitutional:    weight loss, night sweats, fevers, chills, +fatigue, lassitude. HEENT:    headaches, difficulty swallowing, tooth/dental problems, sore throat,       sneezing, itching, ear ache, +nasal congestion, post nasal drip, snoring CV:    chest pain, orthopnea, PND, swelling in lower extremities, anasarca,                                   dizziness, palpitations Resp:   shortness of breath with exertion or at rest.                productive cough,   non-productive cough, coughing up of blood.              change in color of mucus.  wheezing.   Skin:    rash or lesions. GI:  No-   heartburn, indigestion, abdominal pain, nausea, vomiting, diarrhea,                 change in bowel  habits, loss of appetite GU: dysuria, change in color of urine, no urgency or frequency.   flank pain. MS:   joint pain, stiffness, decreased range of motion, back pain. Neuro-     nothing unusual Psych:  change in mood or affect.  depression or anxiety.   memory loss.  OBJ- Physical Exam General- Alert, Oriented, Affect-appropriate, Distress- none acute, + obese Skin- rash-none, lesions- none, excoriation- none Lymphadenopathy- none Head- atraumatic            Eyes- +strabismus            Ears- Hearing, canals-normal            Nose- Clear, no-Septal dev, mucus, polyps, erosion, perforation             Throat- Mallampati II , mucosa clear , drainage- none, tonsils- atrophic, + teeth Neck- flexible , trachea midline, no stridor , thyroid nl, carotid no bruit Chest - symmetrical excursion , unlabored           Heart/CV- RRR , no murmur , no gallop  , no rub, nl s1 s2                           -  JVD- none , edema- none, stasis changes- none, varices- none           Lung- clear to P&A, wheeze- none, cough- none , dullness-none, rub- none           Chest wall-  Abd-  Br/ Gen/ Rectal- Not done, not indicated Extrem- cyanosis- none, clubbing, none, atrophy- none, strength- nl Neuro- grossly intact to observation

## 2021-07-27 ENCOUNTER — Other Ambulatory Visit: Payer: Self-pay

## 2021-07-27 ENCOUNTER — Encounter: Payer: Self-pay | Admitting: Internal Medicine

## 2021-07-27 ENCOUNTER — Ambulatory Visit: Payer: Medicare Other | Admitting: Internal Medicine

## 2021-07-27 DIAGNOSIS — G4733 Obstructive sleep apnea (adult) (pediatric): Secondary | ICD-10-CM | POA: Diagnosis not present

## 2021-07-27 DIAGNOSIS — E0590 Thyrotoxicosis, unspecified without thyrotoxic crisis or storm: Secondary | ICD-10-CM | POA: Diagnosis not present

## 2021-07-27 DIAGNOSIS — Z9989 Dependence on other enabling machines and devices: Secondary | ICD-10-CM

## 2021-07-27 NOTE — Patient Instructions (Addendum)
Suggest you call [facility] from time to time to check on the status of your new CPAP machine. Let us know if we can help.

## 2021-07-30 ENCOUNTER — Other Ambulatory Visit: Payer: Self-pay

## 2021-07-30 ENCOUNTER — Ambulatory Visit: Payer: Medicare Other | Admitting: Physician Assistant

## 2021-07-30 DIAGNOSIS — D692 Other nonthrombocytopenic purpura: Secondary | ICD-10-CM

## 2021-08-07 ENCOUNTER — Encounter: Payer: Self-pay | Admitting: Physician Assistant

## 2021-08-07 NOTE — Progress Notes (Signed)
   Follow-Up Visit   Subjective  Bethany Cole is a 70 y.o. female who presents for the following: Skin Problem (Had some red sores on her right hand she said we froze last visit. They were AK's. She said  that its the same area the hand gets swollen and darker at times. Today her hand is fine but she still wanted it checked. ).   The following portions of the chart were reviewed this encounter and updated as appropriate:  Tobacco  Allergies  Meds  Problems  Med Hx  Surg Hx  Fam Hx      Objective  Well appearing patient in no apparent distress; mood and affect are within normal limits.  A focused examination was performed including upper extremities, including the arms, hands, fingers, and fingernails. Relevant physical exam findings are noted in the Assessment and Plan.  Right Dorsal Hand All wounds healed well. Underlying ecchymosis most likely due to previous UV light.   Assessment & Plan  Purpura ([place]) Right Dorsal Hand  Should self resolve with time.Call if problems.    I, Deonne Rooks, PA-C, have reviewed all documentation's for this visit.  The documentation on 08/07/21 for the exam, diagnosis, procedures and orders are all accurate and complete.

## 2021-08-12 ENCOUNTER — Ambulatory Visit (INDEPENDENT_AMBULATORY_CARE_PROVIDER_SITE_OTHER): Payer: Medicare Other | Admitting: Internal Medicine

## 2021-08-12 ENCOUNTER — Other Ambulatory Visit: Payer: Self-pay

## 2021-08-12 VITALS — BP 126/70 | HR 70 | Ht 64.0 in | Wt 168.0 lb

## 2021-08-12 DIAGNOSIS — E0500 Thyrotoxicosis with diffuse goiter without thyrotoxic crisis or storm: Secondary | ICD-10-CM | POA: Diagnosis not present

## 2021-08-12 DIAGNOSIS — E0590 Thyrotoxicosis, unspecified without thyrotoxic crisis or storm: Secondary | ICD-10-CM | POA: Diagnosis not present

## 2021-08-12 LAB — T4, FREE: Free T4: 0.77 ng/dL (ref 0.60–1.60)

## 2021-08-12 LAB — TSH: TSH: 4.6 u[IU]/mL (ref 0.35–5.50)

## 2021-08-12 NOTE — Progress Notes (Signed)
Name: Bethany Cole  MRN/ DOB: [id number], [birth year]    Age/ Sex: 70 y.o., female     PCP: Isaac Bliss, Rayford Halsted, MD   Reason for Endocrinology Evaluation: Hyperthyroidism     Initial Endocrinology Clinic Visit: 12/03/2020    PATIENT IDENTIFIER: Bethany Cole is a 70 y.o., female with a past medical history of HTN, GERD and Hyperthyroidism. She has followed with [facility] since 12/03/2020 for consultative assistance with management of her Hyperthyroidism.    Moved from [place] 04/2020  HISTORICAL SUMMARY:   She has been diagnosed with hyperthyroidism in 11/2019 during routine work up with a TSH of 0.15 uIU/L , FT4 1.5 ng/dL ( 0.7-1.5 ng/dL) and an elevated T3 4.30 pg/mL ( 1.7-3.7) . She had worsening anxiety and tremors at the time , this was attributed to [place]  On her initial visit at our clinic she was on methimazole 2.5 mg daily    No FH of thyroid disease  SUBJECTIVE:     Today (08/12/2021):  Bethany Cole is here for hyperthyroidism.   She has been noted with weight gain  Has chronic abdominal pain and changes in BM due to IBS  Denies palpitation  Has rare hand tremors  Has dry eyes and uses Restasis  Denies local neck symptoms  She has been irritable and anxious  She uses a CPAP at night   methimazole 5 mg, Half a tablet  Daily  HISTORY:  Past Medical History:  Past Medical History:  Diagnosis Date   Cataracts, bilateral    Gastritis    GERD (gastroesophageal reflux disease)    Hypertension    Hyperthyroidism    OSA on CPAP    Squamous cell carcinoma of skin    Past Surgical History:  Past Surgical History:  Procedure Laterality Date   ABDOMINAL HYSTERECTOMY     ENDOMETRIAL ABLATION     HERNIA REPAIR     as an infant   WRIST ARTHROSCOPY Right    Social History:  reports that she has never smoked. She has never used smokeless tobacco. She reports current alcohol use. She reports that she does not use drugs. Family  History:  Family History  Problem Relation Age of Onset   Ovarian cancer Mother    Stomach cancer Mother        mets from ovarian cancer   Heart disease Father    Cancer - Other Father        laryngeal cancer   Colon cancer Neg Hx    Esophageal cancer Neg Hx    Pancreatic cancer Neg Hx    Liver disease Neg Hx      HOME MEDICATIONS: Allergies as of 08/12/2021       Reactions   Erythromycin    Macrobid [nitrofurantoin]    Penicillins    Sulfa Antibiotics         Medication List        Accurate as of August 12, 2021  1:29 PM. If you have any questions, ask your nurse or doctor.          denosumab 60 MG/ML Sosy injection Commonly known as: PROLIA Inject 60 mg into the skin every 6 (six) months.   dicyclomine 10 MG capsule Commonly known as: BENTYL Take 1 capsule (10 mg total) by mouth 4 (four) times daily -  before meals and at bedtime.   famotidine 20 MG tablet Commonly known as: PEPCID Take 20 mg by mouth daily.   fluticasone  50 MCG/ACT nasal spray Commonly known as: Flonase Place 1 spray into both nostrils 2 (two) times daily.   irbesartan 75 MG tablet Commonly known as: AVAPRO Take 1 tablet (75 mg total) by mouth daily.   meloxicam 15 MG tablet Commonly known as: MOBIC Take 15 mg by mouth daily.   methimazole 5 MG tablet Commonly known as: TAPAZOLE Take 0.5 tablets (2.5 mg total) by mouth daily.   pantoprazole 40 MG tablet Commonly known as: PROTONIX Take 1 tablet (40 mg total) by mouth 2 (two) times daily.   Restasis 0.05 % ophthalmic emulsion Generic drug: cycloSPORINE 1 drop 2 (two) times daily.          OBJECTIVE:   PHYSICAL EXAM: VS: BP 126/70 (BP Location: Right Arm, Patient Position: Sitting, Cuff Size: Small)   Pulse 70   Ht 5\' 4"  (1.626 m)   Wt 168 lb (76.2 kg)   SpO2 99%   BMI 28.84 kg/m    EXAM: General: Pt appears well and is in NAD  Neck: General: Supple without adenopathy. Thyroid: Thyroid size normal.  No  goiter or nodules appreciated.   Lungs: Clear with good BS bilat with no rales, rhonchi, or wheezes  Heart: Auscultation: RRR.  Abdomen: Normoactive bowel sounds, soft, nontender, without masses or organomegaly palpable  Extremities:  BL LE: No pretibial edema normal ROM and strength.  Mental Status: Judgment, insight: Intact Mood and affect: No depression, anxiety, or agitation     DATA REVIEWED:  Results for NOHA, MILBERGER (MRN [id number]) as of 08/13/2021 10:32  Ref. Range 08/12/2021 13:50  TSH Latest Ref Range: 0.35 - 5.50 uIU/mL 4.60  T4,Free(Direct) Latest Ref Range: 0.60 - 1.60 ng/dL 0.77    Results for SHARLEY, KEELER (MRN [id number]) as of 04/09/2021 07:59  Ref. Range 12/03/2020 09:39  TRAB Latest Ref Range: <=2.00 IU/L 1.02    ASSESSMENT / PLAN / RECOMMENDATIONS:   Hyperthyroidism :    - This has been attributed to Graves' disease through a prior diagnosis. TRAB here was detectable but not elevated  at 1.02 IU/L but she already has been on methimazole at the time  - No local neck symptoms  - Clinically euthyroid   Medications   Change  Methimazole 5 mg , HALF a tablet daily except Sundays  F/U in 6 months  Labs in 2 months     Signed electronically by: Mack Guise, MD  [facility]  [address] [place], [place] [address] Phone: [phone] FAX: [phone]      CC: Isaac Bliss, Rayford Halsted, MD [place] [address] Phone: [phone]  Fax: [phone]   Return to Endocrinology clinic as below: Future Appointments  Date Time Provider [place]  02/16/2022 10:15 AM Warren Danes, PA-C [facility]

## 2021-08-13 MED ORDER — METHIMAZOLE 5 MG PO TABS: 2.5000 mg | ORAL_TABLET | ORAL | 1 refills | Status: DC

## 2021-08-28 ENCOUNTER — Encounter: Payer: Self-pay | Admitting: Internal Medicine

## 2021-08-28 ENCOUNTER — Ambulatory Visit (INDEPENDENT_AMBULATORY_CARE_PROVIDER_SITE_OTHER): Payer: Medicare Other | Admitting: Internal Medicine

## 2021-08-28 ENCOUNTER — Other Ambulatory Visit: Payer: Self-pay

## 2021-08-28 VITALS — BP 140/80 | HR 63 | Temp 98.1°F | Wt 169.6 lb

## 2021-08-28 DIAGNOSIS — R5383 Other fatigue: Secondary | ICD-10-CM | POA: Diagnosis not present

## 2021-08-28 DIAGNOSIS — M791 Myalgia, unspecified site: Secondary | ICD-10-CM | POA: Diagnosis not present

## 2021-08-28 LAB — CBC WITH DIFFERENTIAL/PLATELET
Basophils Absolute: 0 10*3/uL (ref 0.0–0.1)
Basophils Relative: 0.7 % (ref 0.0–3.0)
Eosinophils Absolute: 0.1 10*3/uL (ref 0.0–0.7)
Eosinophils Relative: 2 % (ref 0.0–5.0)
HCT: 35.8 % — ABNORMAL LOW (ref 36.0–46.0)
Hemoglobin: 11.7 g/dL — ABNORMAL LOW (ref 12.0–15.0)
Lymphocytes Relative: 26.4 % (ref 12.0–46.0)
Lymphs Abs: 1.6 10*3/uL (ref 0.7–4.0)
MCHC: 32.6 g/dL (ref 30.0–36.0)
MCV: 90.6 fl (ref 78.0–100.0)
Monocytes Absolute: 0.5 10*3/uL (ref 0.1–1.0)
Monocytes Relative: 7.7 % (ref 3.0–12.0)
Neutro Abs: 3.8 10*3/uL (ref 1.4–7.7)
Neutrophils Relative %: 63.2 % (ref 43.0–77.0)
Platelets: 225 10*3/uL (ref 150.0–400.0)
RBC: 3.96 Mil/uL (ref 3.87–5.11)
RDW: 14.5 % (ref 11.5–15.5)
WBC: 5.9 10*3/uL (ref 4.0–10.5)

## 2021-08-28 LAB — COMPREHENSIVE METABOLIC PANEL
ALT: 15 U/L (ref 0–35)
AST: 20 U/L (ref 0–37)
Albumin: 4.3 g/dL (ref 3.5–5.2)
Alkaline Phosphatase: 55 U/L (ref 39–117)
BUN: 13 mg/dL (ref 6–23)
CO2: 30 mEq/L (ref 19–32)
Calcium: 9.5 mg/dL (ref 8.4–10.5)
Chloride: 100 mEq/L (ref 96–112)
Creatinine, Ser: 0.76 mg/dL (ref 0.40–1.20)
GFR: 78.64 mL/min (ref >60.00)
Glucose, Bld: 104 mg/dL — ABNORMAL HIGH (ref 70–99)
Potassium: 4.2 mEq/L (ref 3.5–5.1)
Sodium: 135 mEq/L (ref 135–145)
Total Bilirubin: 0.3 mg/dL (ref 0.2–1.2)
Total Protein: 6.4 g/dL (ref 6.0–8.3)

## 2021-08-28 LAB — TSH: TSH: 3.46 u[IU]/mL (ref 0.35–5.50)

## 2021-08-28 LAB — VITAMIN B12: Vitamin B-12: 181 pg/mL — ABNORMAL LOW (ref 211–911)

## 2021-08-28 LAB — VITAMIN D 25 HYDROXY (VIT D DEFICIENCY, FRACTURES): VITD: 30.26 ng/mL (ref 30.00–100.00)

## 2021-08-28 NOTE — Progress Notes (Signed)
Established Patient Office Visit     This visit occurred during the SARS-CoV-2 public health emergency.  Safety protocols were in place, including screening questions prior to the visit, additional usage of staff PPE, and extensive cleaning of exam room while observing appropriate contact time as indicated for disinfecting solutions.    CC/Reason for Visit: Fatigue and body aches  HPI: Bethany Cole is a 70 y.o. female who is coming in today for the above mentioned reasons. Past Medical History is significant for: Hypertension, hyperlipidemia, hyperthyroidism on methimazole followed by endocrinology, obstructive sleep apnea on CPAP, GERD and osteoporosis.  She has been having extreme fatigue and body aches for about 6 weeks.  She has obstructive sleep apnea and was recently fitted with a new CPAP, she sleeps 7 hours and wakes up feeling refreshed, however fatigue quickly sets in as the day progresses.  She has started no new medications.  She does tell me that relatively recently her endocrinologist has been decreasing her methimazole dose, she is currently taking 2.5 mg on every day except Sunday.  She has noticed that she has been more irritable.  She does not have any current URI symptoms, however she does state that about 8 weeks ago, 2 weeks prior to her developing fatigue, she had what she describes as a "bad cold" that took her about 10 days to get over.  She tested several times for COVID during that time and was negative.  She has had a history of depression throughout her life, she has also been diagnosed with fibromyalgia.  She mentions that her mother has been diagnosed with seasonal affective disorder and usually was very depressed during the winters.  She denies increased lower extremity edema or dyspnea on exertion.   Past Medical/Surgical History: Past Medical History:  Diagnosis Date   Cataracts, bilateral    Gastritis    GERD (gastroesophageal reflux disease)     Hypertension    Hyperthyroidism    OSA on CPAP    Squamous cell carcinoma of skin     Past Surgical History:  Procedure Laterality Date   ABDOMINAL HYSTERECTOMY     ENDOMETRIAL ABLATION     HERNIA REPAIR     as an infant   WRIST ARTHROSCOPY Right     Social History:  reports that she has never smoked. She has never used smokeless tobacco. She reports current alcohol use. She reports that she does not use drugs.  Allergies: Allergies  Allergen Reactions   Erythromycin    Macrobid [Nitrofurantoin]    Penicillins    Sulfa Antibiotics     Family History:  Family History  Problem Relation Age of Onset   Ovarian cancer Mother    Stomach cancer Mother        mets from ovarian cancer   Heart disease Father    Cancer - Other Father        laryngeal cancer   Colon cancer Neg Hx    Esophageal cancer Neg Hx    Pancreatic cancer Neg Hx    Liver disease Neg Hx      Current Outpatient Medications:    denosumab (PROLIA) 60 MG/ML SOSY injection, Inject 60 mg into the skin every 6 (six) months., Disp: , Rfl:    fluticasone (FLONASE) 50 MCG/ACT nasal spray, Place 1 spray into both nostrils 2 (two) times daily., Disp: 16 g, Rfl: 0   irbesartan (AVAPRO) 75 MG tablet, Take 1 tablet (75 mg total) by mouth daily., Disp:  90 tablet, Rfl: 1   magnesium 30 MG tablet, Take 30 mg by mouth 2 (two) times daily., Disp: , Rfl:    methimazole (TAPAZOLE) 5 MG tablet, Take 0.5 tablets (2.5 mg total) by mouth as directed. Half a tablet daily except Sundays, Disp: 40 tablet, Rfl: 1   pantoprazole (PROTONIX) 40 MG tablet, Take 1 tablet (40 mg total) by mouth 2 (two) times daily., Disp: 180 tablet, Rfl: 1   RESTASIS 0.05 % ophthalmic emulsion, 1 drop 2 (two) times daily., Disp: , Rfl:   Review of Systems:  Constitutional: Denies fever, chills, diaphoresis, appetite change. HEENT: Denies photophobia, eye pain, redness, hearing loss, ear pain, congestion, sore throat, rhinorrhea, sneezing, mouth sores,  trouble swallowing, neck pain, neck stiffness and tinnitus.   Respiratory: Denies SOB, DOE, cough, chest tightness,  and wheezing.   Cardiovascular: Denies chest pain, palpitations and leg swelling.  Gastrointestinal: Denies nausea, vomiting, abdominal pain, diarrhea, constipation, blood in stool and abdominal distention.  Genitourinary: Denies dysuria, urgency, frequency, hematuria, flank pain and difficulty urinating.  Endocrine: Denies: hot or cold intolerance, sweats, changes in hair or nails, polyuria, polydipsia. Musculoskeletal: Denies back pain, joint swelling and gait problem.  Skin: Denies pallor, rash and wound.  Neurological: Denies dizziness, seizures, syncope, weakness, light-headedness, numbness and headaches.  Hematological: Denies adenopathy. Easy bruising, personal or family bleeding history  Psychiatric/Behavioral: Denies suicidal ideation, mood changes, confusion, nervousness, sleep disturbance and agitation    Physical Exam: Vitals:   08/28/21 1314  BP: 140/80  Pulse: 63  Temp: 98.1 F (36.7 C)  TempSrc: Oral  SpO2: 98%  Weight: 169 lb 9.6 oz (76.9 kg)    Body mass index is 29.11 kg/m.   Constitutional: NAD, calm, comfortable Eyes: PERRL, lids and conjunctivae normal, wears corrective lenses ENMT: Mucous membranes are moist.  Respiratory: clear to auscultation bilaterally, no wheezing, no crackles. Normal respiratory effort. No accessory muscle use.  Cardiovascular: Regular rate and rhythm, no murmurs / rubs / gallops. No extremity edema.  Neurologic: Grossly intact and nonfocal Psychiatric: Normal judgment and insight. Alert and oriented x 3. Normal mood.     Impression and Plan:  Fatigue, unspecified type  Myalgia  - Plan: CBC with Differential/Platelet, Comprehensive metabolic panel, TSH, Vitamin B12, VITAMIN D 25 Hydroxy (Vit-D Deficiency, Fractures) -Etiology is not totally clear at this time.  A few things that I think we need to rule out are  severe anemia, renal, liver failure, extremes of TSH levels, vitamin B12 and D deficiencies; appropriate labs have been ordered today. -I have also considered the possibility of depression and a PHQ-9 has been performed today.  She has scored a 6 mainly on account of fatigue and decreased energy.  She has a history of fibromyalgia as well. -She does have obstructive sleep apnea, however she was recently fitted with a CPAP and feels like she sleeps 7 hours a night and wakes up feeling refreshed so undertreated OSA does not seem likely. -I also wonder about post viral fatigue syndrome and even possibly postviral myocarditis (although this last one seems less likely) given the time correlation with her recent URI. -Assuming above labs are normal, can consider a trial of an antidepressant and even possibly an echocardiogram if symptoms fail to respond.  Time spent: 34 minutes reviewing chart, interviewing and examining patient and formulating plan of care.   Patient Instructions  -Nice seeing you today!!  -Lab work today; will notify you once results are available.  -Further work up depending on lab  results.    Lelon Frohlich, MD [facility]

## 2021-08-28 NOTE — Patient Instructions (Signed)
-  Nice seeing you today!!  -Lab work today; will notify you once results are available.  -Further work up depending on lab results.

## 2021-09-02 ENCOUNTER — Encounter: Payer: Self-pay | Admitting: Internal Medicine

## 2021-09-02 DIAGNOSIS — E538 Deficiency of other specified B group vitamins: Secondary | ICD-10-CM | POA: Insufficient documentation

## 2021-09-03 ENCOUNTER — Ambulatory Visit (INDEPENDENT_AMBULATORY_CARE_PROVIDER_SITE_OTHER): Payer: Medicare Other

## 2021-09-03 DIAGNOSIS — E538 Deficiency of other specified B group vitamins: Secondary | ICD-10-CM | POA: Diagnosis not present

## 2021-09-03 MED ORDER — CYANOCOBALAMIN 1000 MCG/ML IJ SOLN
1000.0000 ug | Freq: Once | INTRAMUSCULAR | Status: AC
  Administered 2021-09-03: 1000 ug via INTRAMUSCULAR

## 2021-09-03 NOTE — Progress Notes (Addendum)
Per orders of Dr. Hernandez, injection of B12 given by Waylynn Benefiel. Patient tolerated injection well.  

## 2021-09-03 NOTE — Telephone Encounter (Signed)
Prolia VOB initiated via [email or web address]  Last OV:  Next OV:  Last Prolia inj: 03/26/21 Next Prolia inj DUE: 09/26/21

## 2021-09-04 ENCOUNTER — Ambulatory Visit: Payer: Medicare Other

## 2021-09-08 ENCOUNTER — Ambulatory Visit: Payer: Medicare Other | Admitting: Gastroenterology

## 2021-09-08 ENCOUNTER — Encounter: Payer: Self-pay | Admitting: Gastroenterology

## 2021-09-08 VITALS — BP 130/80 | HR 76 | Ht 63.0 in | Wt 169.0 lb

## 2021-09-08 DIAGNOSIS — K31A0 Gastric intestinal metaplasia, unspecified: Secondary | ICD-10-CM | POA: Diagnosis not present

## 2021-09-08 DIAGNOSIS — R140 Abdominal distension (gaseous): Secondary | ICD-10-CM

## 2021-09-08 DIAGNOSIS — K6389 Other specified diseases of intestine: Secondary | ICD-10-CM | POA: Diagnosis not present

## 2021-09-08 DIAGNOSIS — K5909 Other constipation: Secondary | ICD-10-CM | POA: Diagnosis not present

## 2021-09-08 DIAGNOSIS — K638219 Small intestinal bacterial overgrowth, unspecified: Secondary | ICD-10-CM

## 2021-09-08 NOTE — Progress Notes (Signed)
[facility] Progress Note  Chief Complaint: Chronic constipation  Subjective  History:  Bethany Cole follows up after her visit with me on 03/10/2021.  The course of rifaximin made her feel considerably better from the standpoint of abdominal pain bloating and altered bowel habits.  She says it seem to work even better than the first treatment of it she had in [place].  She still tends toward constipation and if she does not have a BM every day will start to be more bloated and uncomfortable.  She is tried increasing dietary fiber but can only do so much before it makes her feel worse.  She denies nausea or dysphagia vomiting or weight loss. Once daily magnesium did improve the constipation somewhat.  She is generally averse to taking prescription medicines of possible ROS: Cardiovascular:  no chest pain Respiratory: no dyspnea Remainder of systems negative except as above The patient's Past Medical, Family and Social History were reviewed and are on file in the EMR.  Objective:  Med list reviewed  Current Outpatient Medications:    denosumab (PROLIA) 60 MG/ML SOSY injection, Inject 60 mg into the skin every 6 (six) months., Disp: , Rfl:    irbesartan (AVAPRO) 75 MG tablet, Take 1 tablet (75 mg total) by mouth daily., Disp: 90 tablet, Rfl: 1   Magnesium 400 MG TABS, Take 400 mg by mouth 3 (three) times daily., Disp: , Rfl:    methimazole (TAPAZOLE) 5 MG tablet, Take 0.5 tablets (2.5 mg total) by mouth as directed. Half a tablet daily except Sundays, Disp: 40 tablet, Rfl: 1   pantoprazole (PROTONIX) 40 MG tablet, Take 1 tablet (40 mg total) by mouth 2 (two) times daily., Disp: 180 tablet, Rfl: 1   RESTASIS 0.05 % ophthalmic emulsion, 1 drop 2 (two) times daily., Disp: , Rfl:    fluticasone (FLONASE) 50 MCG/ACT nasal spray, Place 1 spray into both nostrils 2 (two) times daily., Disp: 16 g, Rfl: 0   Vital signs in last 24 hrs: Vitals:   09/08/21 1323  BP: 130/80  Pulse: 76   Wt  Readings from Last 3 Encounters:  09/08/21 169 lb (76.7 kg)  08/28/21 169 lb 9.6 oz (76.9 kg)  08/12/21 168 lb (76.2 kg)    Physical Exam  Well-appearing HEENT: sclera anicteric, oral mucosa moist without lesions Neck: supple, no thyromegaly, JVD or lymphadenopathy Cardiac: RRR without murmurs, S1S2 heard, no peripheral edema Pulm: clear to auscultation bilaterally, normal RR and effort noted Abdomen: soft, no tenderness, with active bowel sounds. No guarding or palpable hepatosplenomegaly. Skin; warm and dry, no jaundice or rash  Labs:  B12 277 in Jan '22 and 181 on 08/28/21 (Started injections prescribed by primary care) ___________________________________________ Radiologic studies:   ____________________________________________ Other:   _____________________________________________ Assessment & Plan  Assessment: Encounter Diagnoses  Name Primary?   Chronic constipation Yes   Bloating    Small intestinal bacterial overgrowth (SIBO)    Gastric intestinal metaplasia     Chronic functional constipation with bloating, treated with dietary fiber and magnesium.  She asked if there is anything else she could do, and I feel prescription medicines are likely to give her side effects or diarrhea and she was inclined to agree with that.  I asked her to consider low-dose MiraLAX, but she says that probably will give her trouble as well. She is also aware at some point she is likely to have recurrence of SIBO symptoms and need a repeat course of rifaximin.  Lastly, history  of gastric intestinal metaplasia without dysplasia, due for endoscopic surveillance.  Plan: EGD for IM surveillance scheduled.  She was agreeable after discussion of procedure and risks.  The benefits and risks of the planned procedure were described in detail with the patient or (when appropriate) their health care proxy.  Risks were outlined as including, but not limited to, bleeding, infection, perforation,  adverse medication reaction leading to cardiac or pulmonary decompensation, pancreatitis (if ERCP).  The limitation of incomplete mucosal visualization was also discussed.  No guarantees or warranties were given.   Nelida Meuse III

## 2021-09-08 NOTE — Patient Instructions (Signed)
If you are age 71 or older, your body mass index should be between 23-30. Your Body mass index is 29.94 kg/m. If this is out of the aforementioned range listed, please consider follow up with your Primary Care Provider.  If you are age 20 or younger, your body mass index should be between 19-25. Your Body mass index is 29.94 kg/m. If this is out of the aformentioned range listed, please consider follow up with your Primary Care Provider.   ________________________________________________________  The [facility] providers would like to encourage you to use [facility] to communicate with providers for non-urgent requests or questions.  Due to long hold times on the telephone, sending your provider a message by [facility] may be a faster and more efficient way to get a response.  Please allow 48 business hours for a response.  Please remember that this is for non-urgent requests.  _______________________________________________________  It was a pleasure to see you today!  Thank you for trusting me with your gastrointestinal care!

## 2021-09-10 ENCOUNTER — Encounter: Payer: Self-pay | Admitting: Family Medicine

## 2021-09-10 DIAGNOSIS — I10 Essential (primary) hypertension: Secondary | ICD-10-CM | POA: Diagnosis not present

## 2021-09-10 DIAGNOSIS — G4733 Obstructive sleep apnea (adult) (pediatric): Secondary | ICD-10-CM | POA: Diagnosis not present

## 2021-09-11 ENCOUNTER — Ambulatory Visit (INDEPENDENT_AMBULATORY_CARE_PROVIDER_SITE_OTHER): Payer: Medicare Other

## 2021-09-11 ENCOUNTER — Telehealth: Payer: Self-pay | Admitting: Internal Medicine

## 2021-09-11 DIAGNOSIS — E538 Deficiency of other specified B group vitamins: Secondary | ICD-10-CM

## 2021-09-11 MED ORDER — CYANOCOBALAMIN 1000 MCG/ML IJ SOLN
1000.0000 ug | Freq: Once | INTRAMUSCULAR | Status: AC
  Administered 2021-09-11: 1000 ug via INTRAMUSCULAR

## 2021-09-11 NOTE — Progress Notes (Signed)
Per orders of Dr. Hernandez, injection of Cyanocobalamin 1000 mcg given by Dawt Reeb L Saje Gallop. Patient tolerated injection well.  

## 2021-09-11 NOTE — Telephone Encounter (Signed)
Patient received her B12 shot this morning and is wanting to know if she is okay to receive her next B12 on Wednesday of next week or if she should push it to Monday of the following week.  Patient would like a call to schedule her next two B12 shots once Dr. Jerilee Hoh and her team have decided on which day she should receive her B12.  Please advise.

## 2021-09-14 ENCOUNTER — Other Ambulatory Visit: Payer: Self-pay | Admitting: Internal Medicine

## 2021-09-14 DIAGNOSIS — I10 Essential (primary) hypertension: Secondary | ICD-10-CM

## 2021-09-16 NOTE — Telephone Encounter (Signed)
Patient is aware and will call back to schedule.

## 2021-09-17 NOTE — Telephone Encounter (Signed)
Pt ready for scheduling on or after 09/26/21  Out-of-pocket cost due at time of visit: $280  Primary: [facility] Medicare Prolia co-insurance: 20% (approximately $270) Admin fee co-insurance: $35  Secondary: n/a Prolia co-insurance:  Admin fee co-insurance:   Deductible: does not apply  Prior Auth: APPROVED PA# [id number] Valid: 09/17/21-09/17/22  ** This summary of benefits is an estimation of the patient's out-of-pocket cost. Exact cost may vary based on individual plan coverage.

## 2021-09-17 NOTE — Telephone Encounter (Signed)
  Prior auth approved via [employer]

## 2021-09-21 ENCOUNTER — Ambulatory Visit (INDEPENDENT_AMBULATORY_CARE_PROVIDER_SITE_OTHER): Payer: Medicare Other | Admitting: *Deleted

## 2021-09-21 ENCOUNTER — Ambulatory Visit: Payer: Medicare Other

## 2021-09-21 DIAGNOSIS — E538 Deficiency of other specified B group vitamins: Secondary | ICD-10-CM | POA: Diagnosis not present

## 2021-09-21 MED ORDER — CYANOCOBALAMIN 1000 MCG/ML IJ SOLN
1000.0000 ug | Freq: Once | INTRAMUSCULAR | Status: AC
  Administered 2021-09-21: 15:00:00 1000 ug via INTRAMUSCULAR

## 2021-09-23 ENCOUNTER — Other Ambulatory Visit: Payer: Self-pay

## 2021-09-23 DIAGNOSIS — K219 Gastro-esophageal reflux disease without esophagitis: Secondary | ICD-10-CM

## 2021-09-23 MED ORDER — PANTOPRAZOLE SODIUM 40 MG PO TBEC: 40.0000 mg | DELAYED_RELEASE_TABLET | Freq: Two times a day (BID) | ORAL | 1 refills | Status: DC

## 2021-09-23 NOTE — Telephone Encounter (Signed)
Pt informed of below.  Nurse visit scheduled 09/28/21 @ 1045. Prolia ordered.

## 2021-09-24 NOTE — Progress Notes (Signed)
Per orders of Dr. Burchette, injection of Cyanocobalamin 1000mcg given by Aariel Ems A. Patient tolerated injection well.  

## 2021-09-28 ENCOUNTER — Ambulatory Visit: Payer: Medicare Other | Admitting: *Deleted

## 2021-09-28 ENCOUNTER — Ambulatory Visit (INDEPENDENT_AMBULATORY_CARE_PROVIDER_SITE_OTHER): Payer: Medicare Other

## 2021-09-28 DIAGNOSIS — M810 Age-related osteoporosis without current pathological fracture: Secondary | ICD-10-CM

## 2021-09-28 DIAGNOSIS — E538 Deficiency of other specified B group vitamins: Secondary | ICD-10-CM | POA: Diagnosis not present

## 2021-09-28 MED ORDER — CYANOCOBALAMIN 1000 MCG/ML IJ SOLN
1000.0000 ug | Freq: Once | INTRAMUSCULAR | Status: AC
  Administered 2021-09-28: 1000 ug via INTRAMUSCULAR

## 2021-09-28 NOTE — Progress Notes (Signed)
Patient is here for nurse visit for Prolia injection. Patient received Prolia [place] injection in here left arm. She tolerated injection well. She return for her next Prolia injection in 6 months.

## 2021-09-28 NOTE — Progress Notes (Signed)
Pt here for weekly B12 injection #4 of 4 per Dr Jerilee Hoh.  B12 1040mcg given IM right deltiod and pt tolerated injection well.  Next B12 injection scheduled for 10/29/20 for 1 monthly injection as per order from result note on 08/28/21  Dr Ethlyn Gallery: Can you cosign since PCP is out of office today?

## 2021-10-04 NOTE — Telephone Encounter (Signed)
Pt received Prolia inj 09/28/21 Next Prolia inj due 03/30/22

## 2021-10-10 DIAGNOSIS — G4733 Obstructive sleep apnea (adult) (pediatric): Secondary | ICD-10-CM | POA: Diagnosis not present

## 2021-10-10 DIAGNOSIS — I10 Essential (primary) hypertension: Secondary | ICD-10-CM | POA: Diagnosis not present

## 2021-10-13 ENCOUNTER — Other Ambulatory Visit (INDEPENDENT_AMBULATORY_CARE_PROVIDER_SITE_OTHER): Payer: Medicare Other

## 2021-10-13 ENCOUNTER — Other Ambulatory Visit: Payer: Self-pay

## 2021-10-13 DIAGNOSIS — E0590 Thyrotoxicosis, unspecified without thyrotoxic crisis or storm: Secondary | ICD-10-CM

## 2021-10-13 LAB — T4, FREE: Free T4: 0.92 ng/dL (ref 0.60–1.60)

## 2021-10-23 ENCOUNTER — Ambulatory Visit: Payer: Medicare Other

## 2021-10-23 ENCOUNTER — Other Ambulatory Visit: Payer: Self-pay

## 2021-10-23 ENCOUNTER — Ambulatory Visit (INDEPENDENT_AMBULATORY_CARE_PROVIDER_SITE_OTHER): Payer: Medicare Other

## 2021-10-23 ENCOUNTER — Ambulatory Visit: Payer: Self-pay

## 2021-10-23 ENCOUNTER — Ambulatory Visit: Payer: Medicare Other | Admitting: Family Medicine

## 2021-10-23 VITALS — BP 146/86 | HR 59 | Ht 63.0 in | Wt 172.4 lb

## 2021-10-23 DIAGNOSIS — G8929 Other chronic pain: Secondary | ICD-10-CM | POA: Diagnosis not present

## 2021-10-23 DIAGNOSIS — M545 Low back pain, unspecified: Secondary | ICD-10-CM | POA: Diagnosis not present

## 2021-10-23 DIAGNOSIS — M5441 Lumbago with sciatica, right side: Secondary | ICD-10-CM

## 2021-10-23 DIAGNOSIS — M5442 Lumbago with sciatica, left side: Secondary | ICD-10-CM | POA: Diagnosis not present

## 2021-10-23 DIAGNOSIS — M25552 Pain in left hip: Secondary | ICD-10-CM | POA: Diagnosis not present

## 2021-10-23 DIAGNOSIS — M25551 Pain in right hip: Secondary | ICD-10-CM | POA: Diagnosis not present

## 2021-10-23 NOTE — Patient Instructions (Addendum)
Thank you for coming in today.   Please get an Xray today before you leave -l-spine  I've referred you to Physical Therapy.  Let us know if you don't hear from them in one week.   I've placed a referral for orthotics  Recheck back in 8 weeks

## 2021-10-23 NOTE — Progress Notes (Signed)
I, Bethany Cole, LAT, ATC acting as a scribe for Bethany Leader, MD.  Bethany Cole is a 70 y.o. female who presents to [place] at [facility] today for continue R hip pain thought to be due to GT bursitis/ and LBP. Pt was last seen by Dr. Georgina Snell on 04/29/21 and was advised to proceed to MRI. There was no f/u visit after the MRI was obtained. Pt was previously taught HEP and has completed 17 total visits. Today, pt reports she is now having pain in bilat hips and midline of the low back that's worsened over the last 2 month. Pt locates pain to the lateral aspect of bilat hips w/ radiating pain into the anterior-lateral aspect of bilat thighs and the distal portion of her low back midline. Pt c/o increased pain at night and the pain waking her up and night.   She notes that she did pretty well with physical therapy earlier this year but stopped doing the home exercise program after physical therapy ended.  She would like to return to PT for a refresher.  Additionally she is interested in new orthotics.  She has a variety of foot issues including high arches and bunions and her old orthotics are wearing out.  Dx imaging: 05/03/21 R hip MRI  04/29/21 R hip XR  Pertinent review of systems: No fevers or chills  Relevant historical information: Hypertension.   Exam:  BP (!) 146/86    Pulse (!) 59    Ht 5\' 3"  (1.6 m)    Wt 172 lb 6.4 oz (78.2 kg)    SpO2 99%    BMI 30.54 kg/m  General: Well Developed, well nourished, and in no acute distress.   MSK: Left hip normal.  Tender palpation greater trochanter. Right hip normal-appearing tender palpation greater trochanter. L-spine nontender midline decreased lumbar motion. 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  No acute fractures. Await formal radiology review  EXAM: MR OF THE RIGHT HIP  WITHOUT CONTRAST   TECHNIQUE: Multiplanar, multisequence MR imaging was performed. No intravenous contrast was administered.   COMPARISON:  Radiograph 04/29/2021.   FINDINGS: Bones: There is no evidence of acute fracture, dislocation or avascular necrosis. The visualized bony pelvis appears normal. The visualized sacroiliac joints and symphysis pubis appear normal. Prominent facet hypertrophy in the lower lumbar spine with asymmetric involvement of the left L4-5 facet, partially imaged in the coronal plane.   Articular cartilage and labrum   Articular cartilage: No focal chondral defect or subchondral signal abnormality identified. Minimal degenerative changes of both hips.   Labrum: There is no gross labral tear or paralabral abnormality.   Joint or bursal effusion   Joint effusion: No significant hip joint effusion.   Bursae: No focal periarticular fluid collection.   Muscles and tendons   Muscles and tendons: Minimal asymmetric gluteus tendinosis on the right without tear. The common hamstring and iliopsoas tendons appear intact. The piriformis muscles are symmetric.   Other findings   Miscellaneous: Hysterectomy. The visualized internal pelvic contents appear unremarkable.   IMPRESSION: 1. No acute findings or clear explanation for the patient's symptoms. Minimal degenerative changes of the hips. 2. Mild asymmetric right gluteus tendinosis without tear. 3. Lower lumbar facet arthropathy with asymmetric involvement on the left at L4-5, potentially contributing to low back pain.     Electronically Signed   By: Gwyndolyn Saxon  Lin Landsman M.D.   On: 05/04/2021 10:48 I, Bethany Cole, personally (independently) visualized and performed the interpretation of the images attached in this note.    Assessment and Plan: 70 y.o. female with bilateral hip pain laterally left worse than right.  This is an exacerbation of her previous greater trochanteric bursitis seen previously on  MRI.  Discussed options.  Offered steroid injection which she declined.  Plan to proceed to retrial of physical therapy which should be helpful as it worked previously.  Fundamentally she is going to have better results with more ongoing home exercise program in the future.  Additionally she has some low back pain which is very likely due to the degenerative changes seen in on her lower portion of her lumbar spine on the hip MRI.  Again physical therapy should be a good starting point here.  3 L-spine obtained today.  Additionally regarding her feet.  Refer to Dr. Clearance Coots for orthotics.  Recheck in 6 to 8 weeks.   PDMP not reviewed this encounter. Orders Placed This Encounter  Procedures   [place] LIMITED JOINT SPACE STRUCTURES LOW BILAT(NO LINKED CHARGES)    Standing Status:   Future    Number of Occurrences:   1    Standing Expiration Date:   04/23/2022    Order Specific Question:   Reason for Exam (SYMPTOM  OR DIAGNOSIS REQUIRED)    Answer:   bilateral hip pain    Order Specific Question:   Preferred imaging location?    Answer:   [facility]   DG HIP UNILAT W OR W/O PELVIS 2-3 VIEWS LEFT    Standing Status:   Future    Number of Occurrences:   1    Standing Expiration Date:   10/23/2022    Order Specific Question:   Reason for Exam (SYMPTOM  OR DIAGNOSIS REQUIRED)    Answer:   left hip pain    Order Specific Question:   Preferred imaging location?    Answer:   [facility]   DG Lumbar Spine 2-3 Views    Standing Status:   Future    Number of Occurrences:   1    Standing Expiration Date:   10/23/2022    Order Specific Question:   Reason for Exam (SYMPTOM  OR DIAGNOSIS REQUIRED)    Answer:   low back pain    Order Specific Question:   Preferred imaging location?    Answer:   [facility]   Ambulatory referral to Pediatric Sports Medicine    Referral Priority:   Routine    Referral Type:   Rehabilitation    Referral Reason:   Specialty  Services Required    Requested Specialty:   Sports Medicine    Number of Visits Requested:   1   Ambulatory referral to Physical Therapy    Referral Priority:   Routine    Referral Type:   Physical Medicine    Referral Reason:   Specialty Services Required    Requested Specialty:   Physical Therapy    Number of Visits Requested:   1   No orders of the defined types were placed in this encounter.    Discussed warning signs or symptoms. Please see discharge instructions. Patient expresses understanding.   The above documentation has been reviewed and is accurate and complete Bethany Cole, M.D.

## 2021-10-27 NOTE — Progress Notes (Signed)
Lumbar spine x-ray does show some arthritis changes in the low back.

## 2021-10-29 ENCOUNTER — Ambulatory Visit: Payer: Medicare Other | Admitting: Family Medicine

## 2021-10-29 ENCOUNTER — Encounter: Payer: Self-pay | Admitting: Internal Medicine

## 2021-10-29 ENCOUNTER — Ambulatory Visit: Payer: Medicare Other

## 2021-10-29 ENCOUNTER — Ambulatory Visit (INDEPENDENT_AMBULATORY_CARE_PROVIDER_SITE_OTHER): Payer: Medicare Other | Admitting: Internal Medicine

## 2021-10-29 VITALS — BP 128/80 | HR 68 | Temp 98.1°F | Ht 63.0 in

## 2021-10-29 DIAGNOSIS — J069 Acute upper respiratory infection, unspecified: Secondary | ICD-10-CM | POA: Diagnosis not present

## 2021-10-29 DIAGNOSIS — E538 Deficiency of other specified B group vitamins: Secondary | ICD-10-CM

## 2021-10-29 DIAGNOSIS — R5383 Other fatigue: Secondary | ICD-10-CM | POA: Diagnosis not present

## 2021-10-29 MED ORDER — CYANOCOBALAMIN 1000 MCG/ML IJ SOLN
1000.0000 ug | Freq: Once | INTRAMUSCULAR | Status: AC
  Administered 2021-10-29: 1000 ug via INTRAMUSCULAR

## 2021-10-29 NOTE — Progress Notes (Signed)
Established Patient Office Visit     This visit occurred during the SARS-CoV-2 public health emergency.  Safety protocols were in place, including screening questions prior to the visit, additional usage of staff PPE, and extensive cleaning of exam room while observing appropriate contact time as indicated for disinfecting solutions.    CC/Reason for Visit: Fatigue, URI  HPI: Bethany Cole is a 71 y.o. female who is coming in today for the above mentioned reasons.  She is here due to extreme fatigue.  In November she was diagnosed with vitamin B12 deficiency and has started B12 supplementation.  She is due for dose today.  She states that about 3 days ago she started having runny nose, cough, hoarseness of her voice.  And her fatigue has intensified.  She states she took a COVID test at home yesterday that was negative.  Past Medical/Surgical History: Past Medical History:  Diagnosis Date   Cataracts, bilateral    Gastritis    GERD (gastroesophageal reflux disease)    Hypertension    Hyperthyroidism    OSA on CPAP    Squamous cell carcinoma of skin     Past Surgical History:  Procedure Laterality Date   ABDOMINAL HYSTERECTOMY     ENDOMETRIAL ABLATION     HERNIA REPAIR     as an infant   WRIST ARTHROSCOPY Right     Social History:  reports that she has never smoked. She has never used smokeless tobacco. She reports current alcohol use. She reports that she does not use drugs.  Allergies: Allergies  Allergen Reactions   Erythromycin    Macrobid [Nitrofurantoin]    Penicillins    Sulfa Antibiotics     Family History:  Family History  Problem Relation Age of Onset   Ovarian cancer Mother    Stomach cancer Mother        mets from ovarian cancer   Heart disease Father    Cancer - Other Father        laryngeal cancer   Colon cancer Neg Hx    Esophageal cancer Neg Hx    Pancreatic cancer Neg Hx    Liver disease Neg Hx      Current Outpatient Medications:     alendronate (FOSAMAX) 70 MG tablet, Take 70 mg by mouth once a week., Disp: , Rfl:    irbesartan (AVAPRO) 75 MG tablet, TAKE 1 TABLET(75 MG) BY MOUTH DAILY, Disp: 90 tablet, Rfl: 1   Magnesium 400 MG TABS, Take 400 mg by mouth 3 (three) times daily., Disp: , Rfl:    methimazole (TAPAZOLE) 5 MG tablet, Take 0.5 tablets (2.5 mg total) by mouth as directed. Half a tablet daily except Sundays, Disp: 40 tablet, Rfl: 1   pantoprazole (PROTONIX) 40 MG tablet, Take 1 tablet (40 mg total) by mouth 2 (two) times daily., Disp: 180 tablet, Rfl: 1   RESTASIS 0.05 % ophthalmic emulsion, 1 drop 2 (two) times daily., Disp: , Rfl:    fluticasone (FLONASE) 50 MCG/ACT nasal spray, Place 1 spray into both nostrils 2 (two) times daily., Disp: 16 g, Rfl: 0  Current Facility-Administered Medications:    cyanocobalamin ((VITAMIN B-12)) injection 1,000 mcg, 1,000 mcg, Intramuscular, Once, Isaac Bliss, Rayford Halsted, MD  Review of Systems:  Constitutional: Denies fever, chills, diaphoresis, appetite change.  HEENT: Denies photophobia, eye pain, redness, hearing loss,  mouth sores, trouble swallowing, neck pain, neck stiffness and tinnitus.   Respiratory: Denies SOB, DOE,  chest tightness,  and wheezing.  Cardiovascular: Denies chest pain, palpitations and leg swelling.  Gastrointestinal: Denies nausea, vomiting, abdominal pain, diarrhea, constipation, blood in stool and abdominal distention.  Genitourinary: Denies dysuria, urgency, frequency, hematuria, flank pain and difficulty urinating.  Endocrine: Denies: hot or cold intolerance, sweats, changes in hair or nails, polyuria, polydipsia. Musculoskeletal: Denies myalgias, back pain, joint swelling, arthralgias and gait problem.  Skin: Denies pallor, rash and wound.  Neurological: Denies dizziness, seizures, syncope, weakness, light-headedness, numbness and headaches.  Hematological: Denies adenopathy. Easy bruising, personal or family bleeding history   Psychiatric/Behavioral: Denies suicidal ideation, mood changes, confusion, nervousness, sleep disturbance and agitation    Physical Exam: Vitals:   10/29/21 0953  BP: 128/80  Pulse: 68  Temp: 98.1 F (36.7 C)  TempSrc: Oral  SpO2: 98%  Height: 5\' 3"  (1.6 m)    Body mass index is 30.54 kg/m.   Constitutional: NAD, calm, comfortable Eyes: PERRL, lids and conjunctivae normal, wears corrective lenses ENMT: Mucous membranes are moist. Posterior pharynx is erythematous but clear of any exudate or lesions. Normal dentition. Tympanic membrane is pearly white, no erythema or bulging. Respiratory: clear to auscultation bilaterally, no wheezing, no crackles. Normal respiratory effort. No accessory muscle use.  Cardiovascular: Regular rate and rhythm, no murmurs / rubs / gallops. No extremity edema.  Neurologic: Grossly intact and nonfocal Psychiatric: Normal judgment and insight. Alert and oriented x 3. Normal mood.    Impression and Plan:  Fatigue, unspecified type -Suspect due to a combination of factors including vitamin B12 deficiency, OSA and probably effects of upper respiratory infection.  Vitamin B12 deficiency  - Plan: cyanocobalamin ((VITAMIN B-12)) injection 1,000 mcg  Viral upper respiratory tract infection -Given exam findings, PNA, pharyngitis, ear infection are not likely, hence abx have not been prescribed. -Have advised rest, fluids, OTC antihistamines, cough suppressants and mucinex. -RTC if no improvement in 10-14 days. -Have advised that she repeat COVID test in another day or 2 as she might have had an early false negative.   Time spent: 21 minutes reviewing chart, interviewing and examining patient and formulating plan of care.     Lelon Frohlich, MD [facility] at [facility]

## 2021-10-30 DIAGNOSIS — H35371 Puckering of macula, right eye: Secondary | ICD-10-CM | POA: Diagnosis not present

## 2021-10-30 DIAGNOSIS — H52203 Unspecified astigmatism, bilateral: Secondary | ICD-10-CM | POA: Diagnosis not present

## 2021-10-30 DIAGNOSIS — H25012 Cortical age-related cataract, left eye: Secondary | ICD-10-CM | POA: Diagnosis not present

## 2021-10-30 DIAGNOSIS — H2513 Age-related nuclear cataract, bilateral: Secondary | ICD-10-CM | POA: Diagnosis not present

## 2021-11-02 DIAGNOSIS — G4733 Obstructive sleep apnea (adult) (pediatric): Secondary | ICD-10-CM | POA: Diagnosis not present

## 2021-11-02 DIAGNOSIS — I10 Essential (primary) hypertension: Secondary | ICD-10-CM | POA: Diagnosis not present

## 2021-11-03 ENCOUNTER — Ambulatory Visit: Payer: Medicare Other | Admitting: Physical Therapy

## 2021-11-03 ENCOUNTER — Encounter: Payer: Medicare Other | Admitting: Family Medicine

## 2021-11-04 ENCOUNTER — Encounter: Payer: Medicare Other | Admitting: Gastroenterology

## 2021-11-09 ENCOUNTER — Other Ambulatory Visit: Payer: Self-pay

## 2021-11-09 ENCOUNTER — Encounter: Payer: Self-pay | Admitting: Gastroenterology

## 2021-11-09 ENCOUNTER — Ambulatory Visit (AMBULATORY_SURGERY_CENTER): Payer: Medicare Other | Admitting: Gastroenterology

## 2021-11-09 VITALS — BP 156/77 | HR 67 | Temp 99.1°F | Resp 11 | Ht 63.0 in | Wt 169.0 lb

## 2021-11-09 DIAGNOSIS — K2940 Chronic atrophic gastritis without bleeding: Secondary | ICD-10-CM

## 2021-11-09 DIAGNOSIS — K31A0 Gastric intestinal metaplasia, unspecified: Secondary | ICD-10-CM | POA: Diagnosis not present

## 2021-11-09 DIAGNOSIS — K31A19 Gastric intestinal metaplasia without dysplasia, unspecified site: Secondary | ICD-10-CM | POA: Diagnosis not present

## 2021-11-09 DIAGNOSIS — K2950 Unspecified chronic gastritis without bleeding: Secondary | ICD-10-CM | POA: Diagnosis not present

## 2021-11-09 HISTORY — PX: UPPER GASTROINTESTINAL ENDOSCOPY: SHX188

## 2021-11-09 MED ORDER — SODIUM CHLORIDE 0.9 % IV SOLN: 500.0000 mL | Freq: Once | INTRAVENOUS | Status: DC

## 2021-11-09 NOTE — Op Note (Signed)
[place] Patient Name: Bethany Cole Procedure Date: 11/09/2021 10:13 AM MRN: [id number] Endoscopist: [place]. 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 The patient's tolerance of                            previous anesthesia was also reviewed. The risks                            and benefits of the procedure and the sedation                            options and risks were discussed with the patient.                            All questions were answered, and informed consent                            was obtained. Prior Anticoagulants: The patient has                            taken no previous anticoagulant or antiplatelet                            agents. ASA Grade Assessment: II - A patient with                            mild systemic disease. After reviewing the risks                            and benefits, the patient was deemed in                            satisfactory condition to undergo the procedure.  After obtaining informed consent, the endoscope was                            passed under direct vision. Throughout the                            procedure, the patient's blood pressure, pulse, and                            oxygen saturations were monitored continuously. The                            GIF [id number] #[id number] was introduced through the                            mouth, and advanced to  the second part of duodenum.                            The upper GI endoscopy was accomplished without                            difficulty. The patient tolerated the procedure                            (coughing). Scope In: Scope Out: Findings:                 The larynx was normal.                           The esophagus was normal.                           Atrophic mucosa was found in the entire examined                            stomach. Several biopsies were obtained on the                            greater curvature of the gastric body, on the                            lesser curvature of the gastric body, at the                            incisura, on the greater curvature of the gastric                            antrum and on the lesser curvature of the gastric                            antrum with cold forceps for histology. (mapping Bx                            for  IM). There were no raised or suspicious areas                            under WL or NBI.                           The exam of the stomach was otherwise normal.                           The examined duodenum was normal. Complications:            No immediate complications. Estimated Blood Loss:     Estimated blood loss was minimal. Impression:               - Normal larynx.                           - Normal esophagus.                           - Gastric mucosal atrophy.                           - Normal examined duodenum.                           - Several biopsies were obtained on the greater                            curvature of the gastric body, on the lesser                            curvature of the gastric body, at the incisura, on                            the greater curvature of the gastric antrum and on                            the lesser curvature of the gastric antrum. Recommendation:           - Patient has a contact number available for                            emergencies. The  signs and symptoms of potential                            delayed complications were discussed with the                            patient. Return to normal activities tomorrow.                            Written discharge instructions were provided to the                            patient.                           -  Resume previous diet.                           - Continue present medications.                           - Await pathology results. Bethany Cole L. Loletha Carrow, MD 11/09/2021 10:46:30 AM This report has been signed electronically.

## 2021-11-09 NOTE — Progress Notes (Signed)
Called to room to assist during endoscopic procedure.  Patient ID and intended procedure confirmed with present staff. Received instructions for my participation in the procedure from the performing physician.  

## 2021-11-09 NOTE — Progress Notes (Signed)
History and Physical:  This patient presents for endoscopic testing for: Encounter Diagnosis  Name Primary?   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 No changes since then. Routine procedure for surveillance of Gastric IM  ROS: Patient denies chest pain or cough   Past Medical History: Past Medical History:  Diagnosis Date   Cataracts, bilateral    Gastritis    GERD (gastroesophageal reflux disease)    Hypertension    Hyperthyroidism    OSA on CPAP    Squamous cell carcinoma of skin      Past Surgical History: Past Surgical History:  Procedure Laterality Date   ABDOMINAL HYSTERECTOMY     ENDOMETRIAL ABLATION     HERNIA REPAIR     as an infant   UPPER GASTROINTESTINAL ENDOSCOPY  11/09/2021   Has had sevearal in [place]   WRIST ARTHROSCOPY Right     Allergies: Allergies  Allergen Reactions   Erythromycin    Macrobid [Nitrofurantoin]    Penicillins    Sulfa Antibiotics     Outpatient Meds: Current Outpatient Medications  Medication Sig Dispense Refill   acetaminophen (TYLENOL) 650 MG CR tablet Take 650 mg by mouth at bedtime as needed for pain.     denosumab (PROLIA) 60 MG/ML SOSY injection Inject 60 mg into the skin every 6 (six) months. Took in December 2022.     diclofenac Sodium (VOLTAREN) 1 % GEL Apply topically 4 (four) times daily.     irbesartan (AVAPRO) 75 MG tablet TAKE 1 TABLET(75 MG) BY MOUTH DAILY 90 tablet 1   Magnesium 400 MG TABS Take 400 mg by mouth 3 (three) times daily.     methimazole (TAPAZOLE) 5 MG tablet Take 0.5 tablets (2.5 mg total) by mouth as directed. Half a tablet daily except Sundays 40 tablet 1   pantoprazole (PROTONIX) 40 MG tablet Take 1 tablet (40 mg total) by mouth 2 (two) times daily. 180 tablet 1   RESTASIS 0.05 % ophthalmic emulsion 1 drop 2 (two) times daily.     fluticasone (FLONASE) 50 MCG/ACT nasal spray Place 1 spray into both nostrils 2 (two) times daily. 16  g 0   Current Facility-Administered Medications  Medication Dose Route Frequency Provider Last Rate Last Admin   0.9 %  sodium chloride infusion  500 mL Intravenous Once Doran Stabler, MD          ___________________________________________________________________ Objective   Exam:  BP (!) 158/74    Pulse 67    Temp 99.1 F (37.3 C)    Resp 11    Ht 5\' 3"  (1.6 m)    Wt 169 lb (76.7 kg)    SpO2 100%    BMI 29.94 kg/m   CV: RRR without murmur, S1/S2 Resp: clear to auscultation bilaterally, normal RR and effort noted GI: soft, no tenderness, with active bowel sounds.   Assessment: Encounter Diagnosis  Name Primary?   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  Risks were outlined as including, but not limited to, bleeding, infection, perforation, adverse medication reaction leading to cardiac or pulmonary decompensation, pancreatitis (if ERCP).  The limitation of incomplete mucosal visualization was also discussed.  No guarantees or warranties were given.    The patient is appropriate for an endoscopic procedure in the ambulatory setting.   -  [name], MD

## 2021-11-09 NOTE — Patient Instructions (Signed)
Read all of the handouts given to you by your recovery room nurse.  YOU HAD AN ENDOSCOPIC PROCEDURE TODAY AT [facility]:   Refer to the procedure report that was given to you for any specific questions about what was found during the examination.  If the procedure report does not answer your questions, please call your gastroenterologist to clarify.  If you requested that your care partner not be given the details of your procedure findings, then the procedure report has been included in a sealed envelope for you to review at your convenience later.  YOU SHOULD EXPECT: Some feelings of bloating in the abdomen. Passage of more gas than usual.  Walking can help get rid of the air that was put into your GI tract during the procedure and reduce the bloating.   Please Note:  You might notice some irritation and congestion in your nose or some drainage.  This is from the oxygen used during your procedure.  There is no need for concern and it should clear up in a day or so.  SYMPTOMS TO REPORT IMMEDIATELY:   Following upper endoscopy (EGD)  Vomiting of blood or coffee ground material  New chest pain or pain under the shoulder blades  Painful or persistently difficult swallowing  New shortness of breath  Fever of 100F or higher  Black, tarry-looking stools  For urgent or emergent issues, a gastroenterologist can be reached at any hour by calling [phone]. Do not use MyChart messaging for urgent concerns.    DIET:  We do recommend a small meal at first, but then you may proceed to your regular diet.  Drink plenty of fluids but you should avoid alcoholic beverages for 24 hours.  ACTIVITY:  You should plan to take it easy for the rest of today and you should NOT DRIVE or use heavy machinery until tomorrow (because of the sedation medicines used during the test).    FOLLOW UP: Our staff will call the number listed on your records 48-72 hours following your procedure to check  on you and address any questions or concerns that you may have regarding the information given to you following your procedure. If we do not reach you, we will leave a message.  We will attempt to reach you two times.  During this call, we will ask if you have developed any symptoms of COVID 19. If you develop any symptoms (ie: fever, flu-like symptoms, shortness of breath, cough etc.) before then, please call [phone].  If you test positive for Covid 19 in the 2 weeks post procedure, please call and report this information to [place].    If any biopsies were taken you will be contacted by phone or by letter within the next 1-3 weeks.  Please call us at [phone] if you have not heard about the biopsies in 3 weeks.    SIGNATURES/CONFIDENTIALITY: You and/or your care partner have signed paperwork which will be entered into your electronic medical record.  These signatures attest to the fact that that the information above on your After Visit Summary has been reviewed and is understood.  Full responsibility of the confidentiality of this discharge information lies with you and/or your care-partner.

## 2021-11-09 NOTE — Progress Notes (Signed)
Report to PACU, RN, vss, BBS= Clear.  

## 2021-11-09 NOTE — Progress Notes (Signed)
HPI F never smoker followed for OSA, complicated by HTN,Hypercholesterolemia, Hyperthyroid,  HST 04/29/21- AHI/ 17.8/ hr, desaturation to 88%, body weight 163 lbs  ========================================================================  07/27/21- 70 yoF never smoker followed for OSA, complicated by HTN,Hypercholesterolemia, Hyperthyroid,  HST 04/29/21- AHI/ 17.8/ hr, desaturation to 88%, body weight 163 lbs CPAP ordered 05/19/21- auto 5-15/ [employer] This was revalidation to establish new machine and DME after move from [place]. Download- Body weight today-166 lbs Covid vax- Flu vax-  11/10/21- 70 yoF never smoker followed for OSA, complicated by HTN,Hypercholesterolemia, Hyperthyroid,  CPAP auto 5-15/ [employer] Download-compliance 97%, AHI 0.7/ hr  Body weight today-172 lbs Covid vax-5 Moderna Flu vax-had -----Patient is doing good, no concerns She is comfortable with her new machine.  [employer] gave her a different nasal mask that may not seal as well.  We reviewed download and discussed. Otherwise feels stable.  ROS-see HPI   + = positive Constitutional:    weight loss, night sweats, fevers, chills, fatigue, lassitude. HEENT:    headaches, difficulty swallowing, tooth/dental problems, sore throat,       sneezing, itching, ear ache, +nasal congestion, post nasal drip, snoring CV:    chest pain, orthopnea, PND, swelling in lower extremities, anasarca,                                   dizziness, palpitations Resp:   shortness of breath with exertion or at rest.                productive cough,   non-productive cough, coughing up of blood.              change in color of mucus.  wheezing.   Skin:    rash or lesions. GI:  No-   heartburn, indigestion, abdominal pain, nausea, vomiting, diarrhea,                 change in bowel habits, loss of appetite GU: dysuria, change in color of urine, no urgency or frequency.   flank pain. MS:   joint pain, stiffness, decreased range of motion, back  pain. Neuro-     nothing unusual Psych:  change in mood or affect.  depression or anxiety.   memory loss.  OBJ- Physical Exam General- Alert, Oriented, Affect-appropriate, Distress- none acute, + obese Skin- rash-none, lesions- none, excoriation- none Lymphadenopathy- none Head- atraumatic            Eyes- +strabismus            Ears- Hearing, canals-normal            Nose- Clear, no-Septal dev, mucus, polyps, erosion, perforation             Throat- Mallampati II , mucosa clear , drainage- none, tonsils- atrophic, + teeth Neck- flexible , trachea midline, no stridor , thyroid nl, carotid no bruit Chest - symmetrical excursion , unlabored           Heart/CV- RRR , no murmur , no gallop  , no rub, nl s1 s2                           - JVD- none , edema- none, stasis changes- none, varices- none           Lung- clear to P&A, wheeze- none, cough- none , dullness-none, rub- none  Chest wall-  Abd-  Br/ Gen/ Rectal- Not done, not indicated Extrem- cyanosis- none, clubbing, none, atrophy- none, strength- nl Neuro- grossly intact to observation

## 2021-11-10 ENCOUNTER — Encounter: Payer: Self-pay | Admitting: Internal Medicine

## 2021-11-10 ENCOUNTER — Ambulatory Visit: Payer: Medicare Other | Admitting: Internal Medicine

## 2021-11-10 DIAGNOSIS — G4733 Obstructive sleep apnea (adult) (pediatric): Secondary | ICD-10-CM

## 2021-11-10 DIAGNOSIS — E0590 Thyrotoxicosis, unspecified without thyrotoxic crisis or storm: Secondary | ICD-10-CM | POA: Diagnosis not present

## 2021-11-10 DIAGNOSIS — Z9989 Dependence on other enabling machines and devices: Secondary | ICD-10-CM

## 2021-11-10 DIAGNOSIS — I10 Essential (primary) hypertension: Secondary | ICD-10-CM | POA: Diagnosis not present

## 2021-11-10 NOTE — Assessment & Plan Note (Signed)
She benefits from CPAP and we can continue auto 5-15.  I reviewed how to discuss mask choice with her DME company.  If they cannot help we can offer mask fitting referral.

## 2021-11-10 NOTE — Assessment & Plan Note (Signed)
Management continues.  This does not seem to be impacting her sleep much now.

## 2021-11-10 NOTE — Patient Instructions (Signed)
Try using "fresh" nose pieces. You can also speak to [employer] about trying to get a different mask style. If they can't help, please let us know.

## 2021-11-11 ENCOUNTER — Telehealth: Payer: Self-pay

## 2021-11-11 NOTE — Assessment & Plan Note (Signed)
Benefits from CPAP.  Waiting for replacement machine from [employer]. Continue present settings until machine replaced.

## 2021-11-11 NOTE — Telephone Encounter (Signed)
No answer, left message to call back later today, B.Kaidence Sant RN. 

## 2021-11-11 NOTE — Telephone Encounter (Signed)
°  Follow up Call-  Call back number 11/09/2021  Post procedure Call Back phone  # [phone]  Permission to leave phone message Yes     Patient questions:  Do you have a fever, pain , or abdominal swelling? No. Pain Score  0 *  Have you tolerated food without any problems? Yes.    Have you been able to return to your normal activities? Yes.    Do you have any questions about your discharge instructions: Diet   No. Medications  No. Follow up visit  No.  Do you have questions or concerns about your Care? No.  Actions: * If pain score is 4 or above: No action needed, pain <4.

## 2021-11-11 NOTE — Assessment & Plan Note (Signed)
Thyroid function being managed.  I doubt thyroid is contributing to her sense of fatigue.

## 2021-11-12 ENCOUNTER — Encounter: Payer: Self-pay | Admitting: Gastroenterology

## 2021-11-13 ENCOUNTER — Ambulatory Visit: Payer: Medicare Other | Admitting: Family Medicine

## 2021-11-13 ENCOUNTER — Encounter: Payer: Self-pay | Admitting: Family Medicine

## 2021-11-13 VITALS — Ht 64.0 in | Wt 172.0 lb

## 2021-11-13 DIAGNOSIS — G8929 Other chronic pain: Secondary | ICD-10-CM | POA: Diagnosis not present

## 2021-11-13 DIAGNOSIS — M2170 Unequal limb length (acquired), unspecified site: Secondary | ICD-10-CM | POA: Insufficient documentation

## 2021-11-13 DIAGNOSIS — M79672 Pain in left foot: Secondary | ICD-10-CM | POA: Diagnosis not present

## 2021-11-13 DIAGNOSIS — M79671 Pain in right foot: Secondary | ICD-10-CM

## 2021-11-13 NOTE — Assessment & Plan Note (Signed)
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

## 2021-11-13 NOTE — Assessment & Plan Note (Signed)
Her right leg is shorter than left which may be contributing to her hip pain. The lift in the right help correct the abnormal gait she was demonstrating.

## 2021-11-13 NOTE — Progress Notes (Signed)
°  [name] - 71 y.o. female MRN [id number]  Date of birth: 24-Mar-1951  SUBJECTIVE:  Including CC & ROS.  No chief complaint on file.   Bethany Cole is a 71 y.o. female that is presenting with bilateral feet pain and right hip pain.  The symptoms are acute on chronic in nature.  Has had custom orthotics previously in the past.   Review of Systems See HPI   HISTORY: Past Medical, Surgical, Social, and Family History Reviewed & Updated per EMR.   Pertinent Historical Findings include:  Past Medical History:  Diagnosis Date   Cataracts, bilateral    Gastritis    GERD (gastroesophageal reflux disease)    Hypertension    Hyperthyroidism    OSA on CPAP    Squamous cell carcinoma of skin     Past Surgical History:  Procedure Laterality Date   ABDOMINAL HYSTERECTOMY     ENDOMETRIAL ABLATION     HERNIA REPAIR     as an infant   UPPER GASTROINTESTINAL ENDOSCOPY  11/09/2021   Has had sevearal in [place]   WRIST ARTHROSCOPY Right      PHYSICAL EXAM:  VS: Ht 5\' 4"  (1.626 m)    Wt 172 lb (78 kg)    BMI 29.52 kg/m  Physical Exam Gen: NAD, alert, cooperative with exam, well-appearing MSK:  Neurovascularly intact    Patient was fitted for a standard, cushioned, semi-rigid orthotic. The orthotic was heated and afterward the patient stood on the orthotic blank positioned on the orthotic stand. The patient was positioned in subtalar neutral position and 10 degrees of ankle dorsiflexion in a weight bearing stance. After completion of molding, a stable base was applied to the orthotic blank. The blank was ground to a stable position for weight bearing. Size: 75M Pairs: 2 Base: Blue EVA Additional Posting and Padding: right heel lift The patient ambulated these, and they were very comfortable.    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 The lift in the right help correct the abnormal gait she was demonstrating.

## 2021-11-17 ENCOUNTER — Encounter: Payer: Self-pay | Admitting: Gastroenterology

## 2021-11-18 ENCOUNTER — Other Ambulatory Visit: Payer: Self-pay

## 2021-11-18 ENCOUNTER — Ambulatory Visit: Payer: Medicare Other | Attending: Family Medicine | Admitting: Physical Therapy

## 2021-11-18 ENCOUNTER — Encounter: Payer: Self-pay | Admitting: Physical Therapy

## 2021-11-18 DIAGNOSIS — M5416 Radiculopathy, lumbar region: Secondary | ICD-10-CM | POA: Insufficient documentation

## 2021-11-18 DIAGNOSIS — M6281 Muscle weakness (generalized): Secondary | ICD-10-CM | POA: Insufficient documentation

## 2021-11-18 DIAGNOSIS — M25552 Pain in left hip: Secondary | ICD-10-CM | POA: Diagnosis not present

## 2021-11-18 DIAGNOSIS — G8929 Other chronic pain: Secondary | ICD-10-CM | POA: Insufficient documentation

## 2021-11-18 DIAGNOSIS — M25551 Pain in right hip: Secondary | ICD-10-CM | POA: Insufficient documentation

## 2021-11-18 DIAGNOSIS — R2689 Other abnormalities of gait and mobility: Secondary | ICD-10-CM | POA: Diagnosis not present

## 2021-11-18 DIAGNOSIS — M545 Low back pain, unspecified: Secondary | ICD-10-CM | POA: Diagnosis not present

## 2021-11-18 NOTE — Therapy (Signed)
[facility] [address]  [place], [place], [address] Phone: [phone]   Fax:  [phone]  Physical Therapy Evaluation  Patient Details  Name: Bethany Cole MRN: [id number] Date of Birth: 07/30/1951 Referring Provider (PT): [name], MD   Encounter Date: 11/18/2021   PT End of Session - 11/18/21 1015     Visit Number 1    Number of Visits 12    Date for PT Re-Evaluation 12/30/21    Authorization Type [employer] Medicare    PT Start Time 1951    PT Stop Time 1108    PT Time Calculation (min) 53 min    Activity Tolerance Patient tolerated treatment well    Behavior During Therapy WFL for tasks assessed/performed             Past Medical History:  Diagnosis Date   Cataracts, bilateral    Gastritis    GERD (gastroesophageal reflux disease)    Hypertension    Hyperthyroidism    OSA on CPAP    Squamous cell carcinoma of skin     Past Surgical History:  Procedure Laterality Date   ABDOMINAL HYSTERECTOMY     ENDOMETRIAL ABLATION     HERNIA REPAIR     as an infant   UPPER GASTROINTESTINAL ENDOSCOPY  11/09/2021   Has had sevearal in [place] ARTHROSCOPY Right     There were no vitals filed for this visit.    Subjective Assessment - 11/18/21 1021     Subjective Pt reports h/o PT last year for hip pain with good resulsts but then stopped doing the HEP and pain has returned with pain also in low back as of last fall. Feels really stiff, esp after prolonged sitting, with radiculopathy down R>L LE. Pain wakes her up almost every night. She reports she just got orthotics and a R LE lift ~1 week ago and she is still adjusting to these. Also has h/o R ankle fracture ~5 yrs ago which has been bothering her more lately.    Pertinent History HTN, Hyperthyroidism, Osteoporosis, B cataracts, OSA, GERD, Vit B12 deficiency, chronic LBP, B hip and B shoulder pain, leg length discrepancy; History of 2 falls with  fracture of right wrist 2020, right ankle 2018 - Had PT after previous fractures    Limitations Sitting;Standing;Walking;House hold activities;Lifting    How long can you sit comfortably? 30 minutes    How long can you stand comfortably? 10 minutes    How long can you walk comfortably? 15 minutes    Diagnostic tests MRI R hip - 05/03/21: 1. No acute findings or clear explanation for the patient's symptoms. Minimal degenerative changes of the hips.  2. Mild asymmetric right gluteus tendinosis without tear.  3. Lower lumbar facet arthropathy with asymmetric involvement on the left at L4-5, potentially contributing to low back pain.   Lumbar x-ray - 10/23/21: Mild curvature lumbar spine, apex to the left. Grade 1 anterolisthesis of L4 versus L5. No other malalignment. No  fractures. Minimal degenerative disc disease. More significant lumbar facet degenerative changes. Calcified atherosclerosis in the abdominal aorta.    Patient Stated Goals "more flexibility and strength with no pain"    Currently in Pain? Yes    Pain Score 5    up to 7+/10 at times   Pain Location Hip    Pain Orientation Right;Left   R>L   Pain Descriptors / Indicators Aching;Radiating   "intense ache"  Pain Type Chronic pain    Pain Radiating Towards radiates down R>L lateral thigh    Pain Onset Other (comment)   exacerbation Fall 2022   Pain Frequency Constant   on R, more intermittent on L   Aggravating Factors  prolonged sitting or walking with dogs    Pain Relieving Factors generic Voltaren cream & Tylenol ER to get to sleep    Effect of Pain on Daily Activities limits sitting or walking tolerance, difficulty walking on uneven surfaces, difficulty with gardening    Multiple Pain Sites Yes    Pain Score 4   up to 6/10 at worst   Pain Location Back    Pain Orientation Lower   midline   Pain Descriptors / Indicators Tightness   "stiff"   Pain Type Acute pain;Chronic pain    Pain Radiating Towards radiates down R>L lateral  thigh    Pain Onset More than a month ago   Fall 2022   Pain Frequency Constant    Aggravating Factors  walking her dogs    Pain Relieving Factors generic Voltaren cream & Tylenol ER to get to sleep    Effect of Pain on Daily Activities needs to take a moment before she can move upon standing                [facility] PT Assessment - 11/18/21 1016       Assessment   Medical Diagnosis Chronic B hip pain & midline LBP    Referring Provider (PT) [name], MD    Onset Date/Surgical Date --   Fall 2022   Hand Dominance Right    Next MD Visit none    Prior Therapy PT last year for hip pain      Precautions   Precautions None      Restrictions   Weight Bearing Restrictions No      Balance Screen   Has the patient fallen in the past 6 months No    Has the patient had a decrease in activity level because of a fear of falling?  No    Is the patient reluctant to leave their home because of a fear of falling?  No      Home Social worker Private residence    Living Arrangements Non-relatives/Friends    Available Help at Discharge Friend(s)    Type of [place] Access Level entry    [place] None      Prior Function   Level of Independence Independent    Vocation Retired    Leisure gardening, reading, time with family, walking with friends, walking the dogs (daily - walks each dog separately)      Cognition   Overall Cognitive Status Within Functional Limits for tasks assessed      Observation/Other Assessments   Focus on Therapeutic Outcomes (FOTO)  Hip = 49; predicted D/C FS = 59      ROM / Strength   AROM / PROM / Strength AROM;Strength      AROM   AROM Assessment Site Lumbar    Lumbar Flexion hands to upper shins - pain and stiffness    Lumbar Extension 75% limited - stiffness > pain    Lumbar - Right Side Bend hand to lateral knee - stiffness > pain    Lumbar - Left Side Bend hand to lateral knee -  stiffness > pain    Lumbar -  Right Rotation 50% - stiffness > pain in LB + R hip pain    Lumbar - Left Rotation 50% - stiffness > pain in LB      Strength   Overall Strength Comments pain with most resisted R hip and knee motions    Right Hip Flexion 3+/5    Right Hip Extension 2/5    Right Hip External Rotation  3+/5    Right Hip Internal Rotation 4-/5    Right Hip ABduction 3+/5    Right Hip ADduction 3-/5    Left Hip Flexion 4-/5    Left Hip Extension 2/5    Left Hip External Rotation 3+/5    Left Hip Internal Rotation 4-/5    Left Hip ABduction 3+/5    Left Hip ADduction 3-/5    Right Knee Flexion 4-/5    Right Knee Extension 4/5    Left Knee Flexion 4-/5    Left Knee Extension 4/5    Right Ankle Dorsiflexion 3+/5    Right Ankle Plantar Flexion 3+/5   3 SLS heel raises   Right Ankle Inversion --    Left Ankle Dorsiflexion 4-/5    Left Ankle Plantar Flexion 4-/5   7 SLS heel raises     Flexibility   Soft Tissue Assessment /Muscle Length yes    Hamstrings mod tight L>R    Quadriceps mod tight L>R + tight L hip flexors    ITB mod tight L>R (+ ober's on L)    Piriformis mod tight B      Palpation   Palpation comment increased muscle tension with TTP over lumbar paraspinals, R>L glutes, and B ITB/greater trochanter                        Objective measurements completed on examination: See above findings.                PT Education - 11/18/21 1106     Education Details PT eval findings and anticipated POC    Person(s) Educated Patient    Methods Explanation    Comprehension Verbalized understanding              PT Short Term Goals - 11/18/21 1353       PT SHORT TERM GOAL #1   Title Patient will be independent with initial HEP    Status New    Target Date 12/09/21               PT Long Term Goals - 11/18/21 1354       PT LONG TERM GOAL #1   Title Patient will be independent with ongoing/advanced HEP for  self-management at home    Status New    Target Date 12/30/21      PT LONG TERM GOAL #2   Title Patient will verbalize/demonstrate good awareness of neutral spine posture and proper body mechanics for daily tasks    Status New    Target Date 12/30/21      PT LONG TERM GOAL #3   Title Patient to report reduction in frequency and intensity of LBP and B hip pain by >/= 50-75% to allow for improved activity tolerance    Status New    Target Date 12/30/21      PT LONG TERM GOAL #4   Title Patient to demonstrate improved tissue quality and pliability as noted by improved hip and low back flexibility and ROM    Status New  Target Date 12/30/21      PT LONG TERM GOAL #5   Title Patient will demonstrate improved B LE strength to >/= 4/5 for improved stability and ease of mobility    Status New    Target Date 12/30/21      PT LONG TERM GOAL #6   Title Patient to report ability to perform ADLs, household, and leisure activities without limitation due to LBP or B hip pain, LOM or weakness    Status New    Target Date 12/30/21                    Plan - 11/18/21 1311     Clinical Impression Statement Bethany Cole is a 71 y/o female who present to OP PT for acute on chronic B hip pain and LBP. She reports she completed a PT episode ~1 yr ago for hip pain with good results but failed to keep up with her HEP so her hip pain started to return as of last fall. Exacerbation of LBP also occurring in the same time period w/o known triggering event. R hip pain most significant but L hip will also bother her at times. Pain limits positional and activity/walking tolerance and causes her to have to initiate movement slowly after sit to stand transitions. Deficits include R>L hip pain and midline LBP with R>L LE radicular pain, moderately limited lumbar ROM due to stiffness > pain, decreased proximal LE flexibility with increased muscle tension and TTP in glutes/piriformis and lumbar paraspinals, TTP  over B greater trochanters and decreased LE and core strength. Tomesha will benefit from skilled PT to address above deficits and improve proximal LE flexibility and core/LE strength to decrease hip and low back pain and LE radiculopathy for improved mobility and activity tolerance.    Personal Factors and Comorbidities Age;Comorbidity 3+;Fitness;Past/Current Experience;Time since onset of injury/illness/exacerbation    Comorbidities HTN, Hyperthyroidism, Osteoporosis, B cataracts, OSA, GERD, Vit B12 deficiency, chronic LBP, B hip and B shoulder pain, leg length discrepancy; History of 2 falls with fracture of right wrist 2020, right ankle 2018 - Had PT after previous fractures    Examination-Activity Limitations Sit;Stand;Transfers;Locomotion Level;Bend;Lift;Carry    Examination-Participation Restrictions Cleaning;Community Activity;Driving;Volunteer;Yard Work    [employer] Evolving/Moderate complexity    Clinical Decision Making Moderate    Rehab Potential Good    PT Frequency 2x / week    PT Duration 6 weeks    PT Treatment/Interventions ADLs/Self Care Home Management;Cryotherapy;Electrical Stimulation;Iontophoresis 4mg /ml Dexamethasone;Moist Heat;Traction;Ultrasound;Gait training;Stair training;Functional mobility training;Therapeutic activities;Therapeutic exercise;Balance training;Neuromuscular re-education;Patient/family education;Manual techniques;Passive range of motion;Dry needling;Taping;Spinal Manipulations;Joint Manipulations    PT Next Visit Plan Create initial HEP to address core and proximal LE flexibilty and basic lumbopelvic strengthening    Consulted and Agree with Plan of Care Patient             Patient will benefit from skilled therapeutic intervention in order to improve the following deficits and impairments:  Abnormal gait, Decreased activity tolerance, Decreased mobility, Decreased range of motion, Decreased strength, Difficulty walking, Increased  fascial restricitons, Increased muscle spasms, Impaired perceived functional ability, Impaired flexibility, Improper body mechanics, Postural dysfunction, Decreased balance, Pain  Visit Diagnosis: Pain in right hip  Chronic midline low back pain without sciatica  Radiculopathy, lumbar region  Muscle weakness (generalized)  Other abnormalities of gait and mobility     Problem List Patient Active Problem List   Diagnosis Date Noted   Chronic pain of both feet 11/13/2021   Leg length discrepancy  11/13/2021   Vitamin B12 deficiency 09/02/2021   OSA on CPAP    Obesity hypoventilation syndrome [facility])    Fall 11/17/2020   Hyperlipemia 11/07/2020   Osteoporosis 09/30/2020   Hyperthyroidism    Hypertension    GERD (gastroesophageal reflux disease)    Gastritis    Cataracts, bilateral     Percival Spanish, PT 11/18/2021, 2:02 PM  [facility] [address]  [place], [place], [address] Phone: [phone]   Fax:  [phone]  Name: Bethany Cole MRN: [id number] Date of Birth: 14-Jun-1951

## 2021-11-23 ENCOUNTER — Telehealth (INDEPENDENT_AMBULATORY_CARE_PROVIDER_SITE_OTHER): Payer: Medicare Other | Admitting: Internal Medicine

## 2021-11-23 DIAGNOSIS — N393 Stress incontinence (female) (male): Secondary | ICD-10-CM

## 2021-11-23 DIAGNOSIS — R197 Diarrhea, unspecified: Secondary | ICD-10-CM | POA: Diagnosis not present

## 2021-11-23 NOTE — Progress Notes (Signed)
Virtual Visit via Video Note  I connected with Bethany Cole on 11/23/21 at 11:30 AM EST by a video enabled telemedicine application and verified that I am speaking with the correct person using two identifiers.  Location patient: home Location provider: work office Persons participating in the virtual visit: patient, provider  I discussed the limitations of evaluation and management by telemedicine and the availability of in person appointments. The patient expressed understanding and agreed to proceed.   HPI: While having an EGD sometime back she experienced significant bowel and bladder incontinence that was quite embarrassing to her.  She is now having cataract surgery and is concerned about the same happening again.  She would like to know what she could do to prevent this.  She has also been having significant bladder incontinence especially when coughing, sneezing or laughing.   ROS: Constitutional: Denies fever, chills, diaphoresis, appetite change and fatigue.  HEENT: Denies photophobia, eye pain, redness, hearing loss, ear pain, congestion, sore throat, rhinorrhea, sneezing, mouth sores, trouble swallowing, neck pain, neck stiffness and tinnitus.   Respiratory: Denies SOB, DOE, cough, chest tightness,  and wheezing.   Cardiovascular: Denies chest pain, palpitations and leg swelling.  Gastrointestinal: Denies nausea, vomiting, abdominal pain, diarrhea, constipation, blood in stool and abdominal distention.  Genitourinary: Denies dysuria, urgency, frequency, hematuria, flank pain and difficulty urinating.  Endocrine: Denies: hot or cold intolerance, sweats, changes in hair or nails, polyuria, polydipsia. Musculoskeletal: Denies myalgias, back pain, joint swelling, arthralgias and gait problem.  Skin: Denies pallor, rash and wound.  Neurological: Denies dizziness, seizures, syncope, weakness, light-headedness, numbness and headaches.  Hematological: Denies adenopathy. Easy  bruising, personal or family bleeding history  Psychiatric/Behavioral: Denies suicidal ideation, mood changes, confusion, nervousness, sleep disturbance and agitation   Past Medical History:  Diagnosis Date   Cataracts, bilateral    Gastritis    GERD (gastroesophageal reflux disease)    Hypertension    Hyperthyroidism    OSA on CPAP    Squamous cell carcinoma of skin     Past Surgical History:  Procedure Laterality Date   ABDOMINAL HYSTERECTOMY     ENDOMETRIAL ABLATION     HERNIA REPAIR     as an infant   UPPER GASTROINTESTINAL ENDOSCOPY  11/09/2021   Has had sevearal in [place]   [facility]RIST ARTHROSCOPY Right     Family History  Problem Relation Age of Onset   Ovarian cancer Mother    Stomach cancer Mother        mets from ovarian cancer   Heart disease Father    Cancer - Other Father        laryngeal cancer   Colon cancer Neg Hx    Esophageal cancer Neg Hx    Pancreatic cancer Neg Hx    Liver disease Neg Hx    Colon polyps Neg Hx    Rectal cancer Neg Hx     SOCIAL HX:   reports that she has never smoked. She has never used smokeless tobacco. She reports current alcohol use of about 1.0 standard drink per week. She reports that she does not use drugs.   Current Outpatient Medications:    acetaminophen (TYLENOL) 650 MG CR tablet, Take 650 mg by mouth at bedtime as needed for pain., Disp: , Rfl:    Cyanocobalamin (VITAMIN B-12 IJ), Inject as directed every 30 (thirty) days., Disp: , Rfl:    denosumab (PROLIA) 60 MG/ML SOSY injection, Inject 60 mg into the skin every 6 (six) months. Took  in December 2022., Disp: , Rfl:    diclofenac Sodium (VOLTAREN) 1 % GEL, Apply topically 4 (four) times daily., Disp: , Rfl:    irbesartan (AVAPRO) 75 MG tablet, TAKE 1 TABLET(75 MG) BY MOUTH DAILY, Disp: 90 tablet, Rfl: 1   Magnesium 400 MG TABS, Take 400 mg by mouth 3 (three) times daily., Disp: , Rfl:    methimazole (TAPAZOLE) 5 MG tablet, Take 0.5 tablets (2.5 mg total) by mouth as  directed. Half a tablet daily except Sundays, Disp: 40 tablet, Rfl: 1   pantoprazole (PROTONIX) 40 MG tablet, Take 1 tablet (40 mg total) by mouth 2 (two) times daily., Disp: 180 tablet, Rfl: 1   RESTASIS 0.05 % ophthalmic emulsion, 1 drop 2 (two) times daily., Disp: , Rfl:    fluticasone (FLONASE) 50 MCG/ACT nasal spray, Place 1 spray into both nostrils 2 (two) times daily., Disp: 16 g, Rfl: 0   moxifloxacin (VIGAMOX) 0.5 % ophthalmic solution, Apply to eye., Disp: , Rfl:    prednisoLONE acetate (PRED FORTE) 1 % ophthalmic suspension, SMARTSIG:In Eye(s), Disp: , Rfl:   EXAM:   VITALS per patient if applicable: None reported  GENERAL: alert, oriented, appears well and in no acute distress  HEENT: atraumatic, conjunttiva clear, no obvious abnormalities on inspection of external nose and ears, wears corrective lenses  NECK: normal movements of the head and neck  LUNGS: on inspection no signs of respiratory distress, breathing rate appears normal, no obvious gross increased work of breathing, gasping or wheezing  CV: no obvious cyanosis  MS: moves all visible extremities without noticeable abnormality  PSYCH/NEURO: pleasant and cooperative, no obvious depression or anxiety, speech and thought processing grossly intact  ASSESSMENT AND PLAN:   Diarrhea, unspecified type -Discussed that this was likely an activation of her sympathetic system, nothing truly to do to prevent this from happening in the future.  She could use depends on her way back from cataract surgery to prevent incontinence while in the car.  Stress incontinence -We have discussed Kegel exercises.     I discussed the assessment and treatment plan with the patient. The patient was provided an opportunity to ask questions and all were answered. The patient agreed with the plan and demonstrated an understanding of the instructions.   The patient was advised to call back or seek an in-person evaluation if the symptoms  worsen or if the condition fails to improve as anticipated.    Lelon Frohlich, MD  [facility]

## 2021-11-24 ENCOUNTER — Other Ambulatory Visit: Payer: Self-pay

## 2021-11-24 ENCOUNTER — Ambulatory Visit: Payer: Medicare Other | Admitting: Physical Therapy

## 2021-11-24 ENCOUNTER — Encounter: Payer: Self-pay | Admitting: Physical Therapy

## 2021-11-24 DIAGNOSIS — M25551 Pain in right hip: Secondary | ICD-10-CM | POA: Diagnosis not present

## 2021-11-24 DIAGNOSIS — M25552 Pain in left hip: Secondary | ICD-10-CM | POA: Diagnosis not present

## 2021-11-24 DIAGNOSIS — M545 Low back pain, unspecified: Secondary | ICD-10-CM | POA: Diagnosis not present

## 2021-11-24 DIAGNOSIS — R2689 Other abnormalities of gait and mobility: Secondary | ICD-10-CM

## 2021-11-24 DIAGNOSIS — M6281 Muscle weakness (generalized): Secondary | ICD-10-CM

## 2021-11-24 DIAGNOSIS — M5416 Radiculopathy, lumbar region: Secondary | ICD-10-CM

## 2021-11-24 DIAGNOSIS — G8929 Other chronic pain: Secondary | ICD-10-CM | POA: Diagnosis not present

## 2021-11-24 NOTE — Patient Instructions (Signed)
° ° ° °  Access Code: [email or web address] URL: [email or web address] Date: 11/24/2021 Prepared by: Annie Paras  Exercises Hooklying Single Knee to Chest Stretch - 2 x daily - 7 x weekly - 3 reps - 30 sec hold Supine Piriformis Stretch with Foot on Ground - 2 x daily - 7 x weekly - 3 reps - 30 sec hold Supine Lower Trunk Rotation - 2 x daily - 7 x weekly - 5 reps - 10 sec hold Supine Hamstring Stretch with Strap - 2 x daily - 7 x weekly - 3 reps - 30 sec hold Supine ITB Stretch with Strap - 2 x daily - 7 x weekly - 3 reps - 30 sec hold Modified Thomas Stretch - 2 x daily - 7 x weekly - 3 reps - 30 sec hold Supine Posterior Pelvic Tilt - 1 x daily - 7 x weekly - 2 sets - 10 reps - 5 sec hold Supine Hip Adduction Isometric with Ball - 2 x daily - 7 x weekly - 2 sets - 10 reps - 5 sec hold Hooklying Single Leg Bent Knee Fallouts with Resistance - 1 x daily - 7 x weekly - 2 sets - 10 reps - 3 sec hold Bridge with Resistance - 1 x daily - 7 x weekly - 2 sets - 10 reps - 5 sec hold

## 2021-11-24 NOTE — Therapy (Signed)
[facility] [address]  [place], [place], [address] Phone: [phone]   Fax:  [phone]  Physical Therapy Treatment  Patient Details  Name: Bethany Cole MRN: [id number] Date of Birth: October 22, 1951 Referring Provider (PT): [name], MD   Encounter Date: 11/24/2021   PT End of Session - 11/24/21 1404     Visit Number 2    Number of Visits 12    Date for PT Re-Evaluation 12/30/21    Authorization Type UHC Medicare    PT Start Time 1404    PT Stop Time 1450    PT Time Calculation (min) 46 min    Activity Tolerance Patient tolerated treatment well    Behavior During Therapy WFL for tasks assessed/performed             Past Medical History:  Diagnosis Date   Cataracts, bilateral    Gastritis    GERD (gastroesophageal reflux disease)    Hypertension    Hyperthyroidism    OSA on CPAP    Squamous cell carcinoma of skin     Past Surgical History:  Procedure Laterality Date   ABDOMINAL HYSTERECTOMY     ENDOMETRIAL ABLATION     HERNIA REPAIR     as an infant   UPPER GASTROINTESTINAL ENDOSCOPY  11/09/2021   Has had sevearal in [place] ARTHROSCOPY Right     There were no vitals filed for this visit.   Subjective Assessment - 11/24/21 1409     Subjective Pt reports a rough weekend due to increased pain but unable to identify triggering activity. She finds herself limping due to the pain which is causing her foot and ankle and shin pain.    Pertinent History HTN, Hyperthyroidism, Osteoporosis, B cataracts, OSA, GERD, Vit B12 deficiency, chronic LBP, B hip and B shoulder pain, leg length discrepancy; History of 2 falls with fracture of right wrist 2020, right ankle 2018 - Had PT after previous fractures    Diagnostic tests MRI R hip - 05/03/21: 1. No acute findings or clear explanation for the patient's symptoms. Minimal degenerative changes of the hips.  2. Mild asymmetric right gluteus  tendinosis without tear.  3. Lower lumbar facet arthropathy with asymmetric involvement on the left at L4-5, potentially contributing to low back pain.   Lumbar x-ray - 10/23/21: Mild curvature lumbar spine, apex to the left. Grade 1 anterolisthesis of L4 versus L5. No other malalignment. No  fractures. Minimal degenerative disc disease. More significant lumbar facet degenerative changes. Calcified atherosclerosis in the abdominal aorta.    Patient Stated Goals "more flexibility and strength with no pain"    Currently in Pain? Yes    Pain Score 7     Pain Location Hip    Pain Orientation Right;Left    Pain Descriptors / Indicators Aching;Radiating    Pain Type Chronic pain    Pain Radiating Towards aching pain into R thigh    Pain Frequency Constant    Pain Score 7    Pain Location Back    Pain Orientation Lower    Pain Descriptors / Indicators Tightness    Pain Type Acute pain;Chronic pain    Pain Frequency Constant                               [facility] Adult PT Treatment/Exercise - 11/24/21 1404       Lumbar Exercises: Stretches  Passive Hamstring Stretch Right;Left;2 reps;30 seconds    Passive Hamstring Stretch Limitations hooklying with strap    Single Knee to Chest Stretch Right;Left;1 rep;30 seconds    Single Knee to Chest Stretch Limitations hooklying    Lower Trunk Rotation 2 reps;10 seconds    ITB Stretch Right;Left;1 rep;30 seconds    ITB Stretch Limitations supine crossbody with strap    Piriformis Stretch Right;Left;1 rep;30 seconds    Piriformis Stretch Limitations hooklying KTOS    Figure 4 Stretch 1 rep;30 seconds;Supine;With overpressure    Figure 4 Stretch Limitations pt reports increased pain - prefers KTOS      Lumbar Exercises: Supine   Ab Set 10 reps;5 seconds    Pelvic Tilt 10 reps;5 seconds    Pelvic Tilt Limitations PPT    Clam 10 reps;3 seconds    Clam Limitations alt RTB bent-knee fall-out    Bridge 10 reps;5 seconds    Bridge  Limitations + RTB hip ABD isometric    Other Supine Lumbar Exercises TrA/PPT + hip ADD isometric 10 x 5"      Knee/Hip Exercises: Aerobic   Nustep L4 x 6 min (UE/LE)                     PT Education - 11/24/21 1448     Education Details Initial HEP - Access Code: [email or web address]    Person(s) Educated Patient    Methods Explanation;Demonstration;Verbal cues;Tactile cues;Handout    Comprehension Verbalized understanding;Verbal cues required;Tactile cues required;Returned demonstration;Need further instruction              PT Short Term Goals - 11/24/21 1412       PT SHORT TERM GOAL #1   Title Patient will be independent with initial HEP    Status On-going    Target Date 12/09/21               PT Long Term Goals - 11/24/21 1412       PT LONG TERM GOAL #1   Title Patient will be independent with ongoing/advanced HEP for self-management at home    Status On-going    Target Date 12/30/21      PT LONG TERM GOAL #2   Title Patient will verbalize/demonstrate good awareness of neutral spine posture and proper body mechanics for daily tasks    Status On-going    Target Date 12/30/21      PT LONG TERM GOAL #3   Title Patient to report reduction in frequency and intensity of LBP and B hip pain by >/= 50-75% to allow for improved activity tolerance    Status On-going    Target Date 12/30/21      PT LONG TERM GOAL #4   Title Patient to demonstrate improved tissue quality and pliability as noted by improved hip and low back flexibility and ROM    Status On-going    Target Date 12/30/21      PT LONG TERM GOAL #5   Title Patient will demonstrate improved B LE strength to >/= 4/5 for improved stability and ease of mobility    Status On-going    Target Date 12/30/21      PT LONG TERM GOAL #6   Title Patient to report ability to perform ADLs, household, and leisure activities without limitation due to LBP or B hip pain, LOM or weakness    Status On-going    Target  Date 12/30/21      PT LONG TERM GOAL #7  Status On-going                   Plan - 11/24/21 1450     Clinical Impression Statement Bethany Cole reports increased back and B hip pain (R>L) today w/o know trigger. Initiated creation of a HEP targeting lumbar and proximal LE flexibility as well as core and lumbopelvic stabilization and strengthening, adjusting and selecting exercises based on pt tolerance. Pt verbalized good understanding of initial HEP and able to perform good return demonstration of all selected exercises.    Comorbidities HTN, Hyperthyroidism, Osteoporosis, B cataracts, OSA, GERD, Vit B12 deficiency, chronic LBP, B hip and B shoulder pain, leg length discrepancy; History of 2 falls with fracture of right wrist 2020, right ankle 2018 - Had PT after previous fractures    Rehab Potential Good    PT Frequency 2x / week    PT Duration 6 weeks    PT Treatment/Interventions ADLs/Self Care Home Management;Cryotherapy;Electrical Stimulation;Iontophoresis 4mg /ml Dexamethasone;Moist Heat;Traction;Ultrasound;Gait training;Stair training;Functional mobility training;Therapeutic activities;Therapeutic exercise;Balance training;Neuromuscular re-education;Patient/family education;Manual techniques;Passive range of motion;Dry needling;Taping;Spinal Manipulations;Joint Manipulations    PT Next Visit Plan Review initial HEP; progress core and proximal LE flexibilty and lumbopelvic strengthening; MT and modalities PRN for pain management    PT Home Exercise Plan Access Code: [email or web address] (1/31)    Consulted and Agree with Plan of Care Patient             Patient will benefit from skilled therapeutic intervention in order to improve the following deficits and impairments:  Abnormal gait, Decreased activity tolerance, Decreased mobility, Decreased range of motion, Decreased strength, Difficulty walking, Increased fascial restricitons, Increased muscle spasms, Impaired perceived functional ability,  Impaired flexibility, Improper body mechanics, Postural dysfunction, Decreased balance, Pain  Visit Diagnosis: Pain in right hip  Chronic midline low back pain without sciatica  Radiculopathy, lumbar region  Muscle weakness (generalized)  Other abnormalities of gait and mobility     Problem List Patient Active Problem List   Diagnosis Date Noted   Chronic pain of both feet 11/13/2021   Leg length discrepancy 11/13/2021   Vitamin B12 deficiency 09/02/2021   OSA on CPAP    Obesity hypoventilation syndrome ([place])    Fall 11/17/2020   Hyperlipemia 11/07/2020   Osteoporosis 09/30/2020   Hyperthyroidism    Hypertension    GERD (gastroesophageal reflux disease)    Gastritis    Cataracts, bilateral     Percival Spanish, PT 11/24/2021, 8:02 PM  [facility] [address]  [place], [place], [address] Phone: [phone]   Fax:  [phone]  Name: Bethany Cole MRN: [id number] Date of Birth: [birth year]

## 2021-11-27 ENCOUNTER — Other Ambulatory Visit: Payer: Self-pay

## 2021-11-27 ENCOUNTER — Ambulatory Visit: Payer: Medicare Other | Attending: Family Medicine

## 2021-11-27 DIAGNOSIS — R2681 Unsteadiness on feet: Secondary | ICD-10-CM | POA: Diagnosis not present

## 2021-11-27 DIAGNOSIS — M545 Low back pain, unspecified: Secondary | ICD-10-CM

## 2021-11-27 DIAGNOSIS — G8929 Other chronic pain: Secondary | ICD-10-CM | POA: Diagnosis not present

## 2021-11-27 DIAGNOSIS — M6281 Muscle weakness (generalized): Secondary | ICD-10-CM

## 2021-11-27 DIAGNOSIS — M5416 Radiculopathy, lumbar region: Secondary | ICD-10-CM

## 2021-11-27 DIAGNOSIS — R2689 Other abnormalities of gait and mobility: Secondary | ICD-10-CM

## 2021-11-27 DIAGNOSIS — M25551 Pain in right hip: Secondary | ICD-10-CM

## 2021-11-27 NOTE — Therapy (Signed)
[facility] [address]  [place], [place], [address] Phone: [phone]   Fax:  [phone]  Physical Therapy Treatment  Patient Details  Name: Bethany Cole MRN: [id number] Date of Birth: 12/20/1950 Referring Provider (PT): [name], MD   Encounter Date: 11/27/2021   PT End of Session - 11/27/21 1018     Visit Number 3    Number of Visits 12    Date for PT Re-Evaluation 12/30/21    Authorization Type UHC Medicare    PT Start Time 0932    PT Stop Time 1015    PT Time Calculation (min) 43 min    Activity Tolerance Patient tolerated treatment well    Behavior During Therapy WFL for tasks assessed/performed             Past Medical History:  Diagnosis Date   Cataracts, bilateral    Gastritis    GERD (gastroesophageal reflux disease)    Hypertension    Hyperthyroidism    OSA on CPAP    Squamous cell carcinoma of skin     Past Surgical History:  Procedure Laterality Date   ABDOMINAL HYSTERECTOMY     ENDOMETRIAL ABLATION     HERNIA REPAIR     as an infant   UPPER GASTROINTESTINAL ENDOSCOPY  11/09/2021   Has had sevearal in [place] ARTHROSCOPY Right     There were no vitals filed for this visit.   Subjective Assessment - 11/27/21 0934     Subjective Reports that she feels looser but has not done exercises yet at home.    Pertinent History HTN, Hyperthyroidism, Osteoporosis, B cataracts, OSA, GERD, Vit B12 deficiency, chronic LBP, B hip and B shoulder pain, leg length discrepancy; History of 2 falls with fracture of right wrist 2020, right ankle 2018 - Had PT after previous fractures    Diagnostic tests MRI R hip - 05/03/21: 1. No acute findings or clear explanation for the patient's symptoms. Minimal degenerative changes of the hips.  2. Mild asymmetric right gluteus tendinosis without tear.  3. Lower lumbar facet arthropathy with asymmetric involvement on the left at L4-5, potentially  contributing to low back pain.   Lumbar x-ray - 10/23/21: Mild curvature lumbar spine, apex to the left. Grade 1 anterolisthesis of L4 versus L5. No other malalignment. No  fractures. Minimal degenerative disc disease. More significant lumbar facet degenerative changes. Calcified atherosclerosis in the abdominal aorta.    Patient Stated Goals "more flexibility and strength with no pain"    Currently in Pain? Yes    Pain Score 7     Pain Location Hip    Pain Orientation Right;Left    Pain Descriptors / Indicators Aching;Radiating    Pain Type Chronic pain    Pain Score 7    Pain Location Back    Pain Orientation Lower    Pain Descriptors / Indicators Tightness    Pain Type Acute pain;Chronic pain                               [facility] Adult PT Treatment/Exercise - 11/27/21 0001       Lumbar Exercises: Stretches   Single Knee to Chest Stretch Right;Left;30 seconds    Single Knee to Chest Stretch Limitations hooklying    Hip Flexor Stretch Right;30 seconds;2 reps    Hip Flexor Stretch Limitations in runner stretch position    Piriformis  Stretch Right;Left;1 rep;30 seconds    Piriformis Stretch Limitations hooklying KTOS    Figure 4 Stretch 1 rep;30 seconds;Supine;With overpressure      Lumbar Exercises: Standing   Heel Raises 10 reps;3 seconds    Heel Raises Limitations 2 HA      Lumbar Exercises: Supine   Clam 5 seconds;15 reps   RTB   Bent Knee Raise 10 reps;2 seconds   RTB march   Bridge 15 reps;3 seconds    Bridge Limitations + RTB hip ABD isometric    Other Supine Lumbar Exercises TrA/PPT + hip ADD isometric 15 x 5"      Knee/Hip Exercises: Aerobic   Nustep L4 x 6 min (UE/LE)      Knee/Hip Exercises: Standing   Hip Abduction AROM;Both;10 reps;Knee straight    Abduction Limitations 2 HA                       PT Short Term Goals - 11/24/21 1412       PT SHORT TERM GOAL #1   Title Patient will be independent with initial HEP    Status  On-going    Target Date 12/09/21               PT Long Term Goals - 11/24/21 1412       PT LONG TERM GOAL #1   Title Patient will be independent with ongoing/advanced HEP for self-management at home    Status On-going    Target Date 12/30/21      PT LONG TERM GOAL #2   Title Patient will verbalize/demonstrate good awareness of neutral spine posture and proper body mechanics for daily tasks    Status On-going    Target Date 12/30/21      PT LONG TERM GOAL #3   Title Patient to report reduction in frequency and intensity of LBP and B hip pain by >/= 50-75% to allow for improved activity tolerance    Status On-going    Target Date 12/30/21      PT LONG TERM GOAL #4   Title Patient to demonstrate improved tissue quality and pliability as noted by improved hip and low back flexibility and ROM    Status On-going    Target Date 12/30/21      PT LONG TERM GOAL #5   Title Patient will demonstrate improved B LE strength to >/= 4/5 for improved stability and ease of mobility    Status On-going    Target Date 12/30/21      PT LONG TERM GOAL #6   Title Patient to report ability to perform ADLs, household, and leisure activities without limitation due to LBP or B hip pain, LOM or weakness    Status On-going    Target Date 12/30/21      PT LONG TERM GOAL #7   Status On-going                   Plan - 11/27/21 1018     Clinical Impression Statement Pt had no problems completing the interventions today, only noted some muscle fatigue from the increased repetitions of exercises. Progressed exercises for proximal LE strengthening and core stab to improve stability of the R hip. Able to incorporate standing ther ex but low load, pt did report more difficulty with standing hip ABD. She will miss next week of PT for her cataracts surgery.    Personal Factors and Comorbidities Age;Comorbidity 3+;Fitness;Past/Current Experience;Time since onset of  injury/illness/exacerbation     Comorbidities HTN, Hyperthyroidism, Osteoporosis, B cataracts, OSA, GERD, Vit B12 deficiency, chronic LBP, B hip and B shoulder pain, leg length discrepancy; History of 2 falls with fracture of right wrist 2020, right ankle 2018 - Had PT after previous fractures    PT Frequency 2x / week    PT Duration 6 weeks    PT Treatment/Interventions ADLs/Self Care Home Management;Cryotherapy;Electrical Stimulation;Iontophoresis 4mg /ml Dexamethasone;Moist Heat;Traction;Ultrasound;Gait training;Stair training;Functional mobility training;Therapeutic activities;Therapeutic exercise;Balance training;Neuromuscular re-education;Patient/family education;Manual techniques;Passive range of motion;Dry needling;Taping;Spinal Manipulations;Joint Manipulations    PT Next Visit Plan progress core and proximal LE flexibilty and lumbopelvic strengthening; MT and modalities PRN for pain management    PT Home Exercise Plan Access Code: [email or web address] (1/31)    Consulted and Agree with Plan of Care Patient             Patient will benefit from skilled therapeutic intervention in order to improve the following deficits and impairments:  Abnormal gait, Decreased activity tolerance, Decreased mobility, Decreased range of motion, Decreased strength, Difficulty walking, Increased fascial restricitons, Increased muscle spasms, Impaired perceived functional ability, Impaired flexibility, Improper body mechanics, Postural dysfunction, Decreased balance, Pain  Visit Diagnosis: Pain in right hip  Chronic midline low back pain without sciatica  Radiculopathy, lumbar region  Muscle weakness (generalized)  Other abnormalities of gait and mobility     Problem List Patient Active Problem List   Diagnosis Date Noted   Chronic pain of both feet 11/13/2021   Leg length discrepancy 11/13/2021   Vitamin B12 deficiency 09/02/2021   OSA on CPAP    Obesity hypoventilation syndrome ([place])    Fall 11/17/2020   Hyperlipemia 11/07/2020    Osteoporosis 09/30/2020   Hyperthyroidism    Hypertension    GERD (gastroesophageal reflux disease)    Gastritis    Cataracts, bilateral     Artist Pais, PTA 11/27/2021, 11:34 AM  [facility] [address]  [place], [place], [address] Phone: [phone]   Fax:  [phone]  Name: Bethany Cole MRN: [id number] Date of Birth: 07-25-1951

## 2021-11-30 ENCOUNTER — Ambulatory Visit: Payer: Medicare Other

## 2021-12-01 DIAGNOSIS — H2512 Age-related nuclear cataract, left eye: Secondary | ICD-10-CM | POA: Diagnosis not present

## 2021-12-01 DIAGNOSIS — H25812 Combined forms of age-related cataract, left eye: Secondary | ICD-10-CM | POA: Diagnosis not present

## 2021-12-01 DIAGNOSIS — H25042 Posterior subcapsular polar age-related cataract, left eye: Secondary | ICD-10-CM | POA: Diagnosis not present

## 2021-12-01 DIAGNOSIS — H25012 Cortical age-related cataract, left eye: Secondary | ICD-10-CM | POA: Diagnosis not present

## 2021-12-02 ENCOUNTER — Ambulatory Visit (INDEPENDENT_AMBULATORY_CARE_PROVIDER_SITE_OTHER): Payer: Medicare Other | Admitting: *Deleted

## 2021-12-02 DIAGNOSIS — E538 Deficiency of other specified B group vitamins: Secondary | ICD-10-CM | POA: Diagnosis not present

## 2021-12-02 MED ORDER — CYANOCOBALAMIN 1000 MCG/ML IJ SOLN
1000.0000 ug | Freq: Once | INTRAMUSCULAR | Status: AC
  Administered 2021-12-02: 1000 ug via INTRAMUSCULAR

## 2021-12-02 NOTE — Progress Notes (Signed)
Pt here for monthly B12 injection per   B12 10102mcg given IM, and pt tolerated injection well.  Next B12 injection scheduled for 1 week

## 2021-12-06 ENCOUNTER — Other Ambulatory Visit: Payer: Self-pay | Admitting: Internal Medicine

## 2021-12-08 ENCOUNTER — Ambulatory Visit: Payer: Medicare Other | Admitting: Physical Therapy

## 2021-12-08 ENCOUNTER — Encounter: Payer: Self-pay | Admitting: Physical Therapy

## 2021-12-08 ENCOUNTER — Other Ambulatory Visit: Payer: Self-pay

## 2021-12-08 DIAGNOSIS — M545 Low back pain, unspecified: Secondary | ICD-10-CM

## 2021-12-08 DIAGNOSIS — M25551 Pain in right hip: Secondary | ICD-10-CM | POA: Diagnosis not present

## 2021-12-08 DIAGNOSIS — G8929 Other chronic pain: Secondary | ICD-10-CM | POA: Diagnosis not present

## 2021-12-08 DIAGNOSIS — M5416 Radiculopathy, lumbar region: Secondary | ICD-10-CM

## 2021-12-08 DIAGNOSIS — M6281 Muscle weakness (generalized): Secondary | ICD-10-CM | POA: Diagnosis not present

## 2021-12-08 DIAGNOSIS — R2689 Other abnormalities of gait and mobility: Secondary | ICD-10-CM

## 2021-12-08 DIAGNOSIS — R2681 Unsteadiness on feet: Secondary | ICD-10-CM | POA: Diagnosis not present

## 2021-12-08 NOTE — Therapy (Signed)
[facility] [address]  [place], [place], [address] Phone: [phone]   Fax:  [phone]  Physical Therapy Treatment  Patient Details  Name: Bethany Cole MRN: [id number] Date of Birth: 22-Nov-1950 Referring Provider (PT): [name], MD   Encounter Date: 12/08/2021   PT End of Session - 12/08/21 1402     Visit Number 4    Number of Visits 12    Date for PT Re-Evaluation 12/30/21    Authorization Type [employer] Medicare    PT Start Time 1402    PT Stop Time 1443    PT Time Calculation (min) 41 min    Activity Tolerance Patient tolerated treatment well    Behavior During Therapy WFL for tasks assessed/performed             Past Medical History:  Diagnosis Date   Cataracts, bilateral    Gastritis    GERD (gastroesophageal reflux disease)    Hypertension    Hyperthyroidism    OSA on CPAP    Squamous cell carcinoma of skin     Past Surgical History:  Procedure Laterality Date   ABDOMINAL HYSTERECTOMY     ENDOMETRIAL ABLATION     HERNIA REPAIR     as an infant   UPPER GASTROINTESTINAL ENDOSCOPY  11/09/2021   Has had sevearal in [place] ARTHROSCOPY Right     There were no vitals filed for this visit.   Subjective Assessment - 12/08/21 1404     Subjective Pt reports she has been doing the exercises regularly 1x/day but finds she doesn't sleep as well if she tries to do the exercises a second time. She notes increased soreness following her last therapy session that limited her for a couple days.    Pertinent History HTN, Hyperthyroidism, Osteoporosis, B cataracts, OSA, GERD, Vit B12 deficiency, chronic LBP, B hip and B shoulder pain, leg length discrepancy; History of 2 falls with fracture of right wrist 2020, right ankle 2018 - Had PT after previous fractures    Diagnostic tests MRI R hip - 05/03/21: 1. No acute findings or clear explanation for the patient's symptoms. Minimal degenerative  changes of the hips.  2. Mild asymmetric right gluteus tendinosis without tear.  3. Lower lumbar facet arthropathy with asymmetric involvement on the left at L4-5, potentially contributing to low back pain.   Lumbar x-ray - 10/23/21: Mild curvature lumbar spine, apex to the left. Grade 1 anterolisthesis of L4 versus L5. No other malalignment. No  fractures. Minimal degenerative disc disease. More significant lumbar facet degenerative changes. Calcified atherosclerosis in the abdominal aorta.    Patient Stated Goals "more flexibility and strength with no pain"    Currently in Pain? Yes    Pain Score 6     Pain Location Hip    Pain Orientation Right;Left   R>L   Pain Descriptors / Indicators Sore;Aching;Radiating    Pain Type Chronic pain    Pain Radiating Towards aching pain into R thigh and lateral lower leg    Pain Frequency Constant    Pain Score 3    Pain Location Back    Pain Orientation Lower    Pain Descriptors / Indicators Aching   "cautious with moving"   Pain Type Acute pain;Chronic pain    Pain Frequency Intermittent  [facility] Treatment/Exercise - 12/08/21 1402       Lumbar Exercises: Stretches   Hip Flexor Stretch Right;2 reps;30 seconds    Hip Flexor Stretch Limitations seated lunge position over edge of chair    ITB Stretch Right;2 reps;30 seconds    ITB Stretch Limitations supine crossbody with strap    Piriformis Stretch Right;Left;1 rep;30 seconds    Piriformis Stretch Limitations hooklying KTOS      Lumbar Exercises: Seated   Long Arc Quad on Chair Both;10 reps;Strengthening    LAQ on Chair Limitations cues for TrA engagement taking care not to slouch or round back    Hip Flexion on Ball Both;10 reps    Hip Flexion on Ball Limitations w/o ball; cues for TrA engagement    Other Seated Lumbar Exercises TrA isometric 10 x 5"    Other Seated Lumbar Exercises TrA + hip ADD pillow squeeze isometric 10 x 5"       Knee/Hip Exercises: Aerobic   Recumbent Bike L2 x 6 min                     PT Education - 12/08/21 1441     Education Details HEP modification/clarification - hip flexor stretch changed to sitting, clarified frequency reducing stretches to 1x/day and strengthening to daily to qod as needed to improve tolerance    Person(s) Educated Patient    Methods Explanation;Demonstration;Handout    Comprehension Verbalized understanding;Returned demonstration              PT Short Term Goals - 12/08/21 1412       PT SHORT TERM GOAL #1   Title Patient will be independent with initial HEP    Status On-going   12/08/21 - HEP reviewed and modified today d/t c/o increased pain after home performance   Target Date 12/09/21               PT Long Term Goals - 12/08/21 1414       PT LONG TERM GOAL #1   Title Patient will be independent with ongoing/advanced HEP for self-management at home    Status On-going    Target Date 12/30/21      PT LONG TERM GOAL #2   Title Patient will verbalize/demonstrate good awareness of neutral spine posture and proper body mechanics for daily tasks    Status On-going    Target Date 12/30/21      PT LONG TERM GOAL #3   Title Patient to report reduction in frequency and intensity of LBP and B hip pain by >/= 50-75% to allow for improved activity tolerance    Status On-going    Target Date 12/30/21      PT LONG TERM GOAL #4   Title Patient to demonstrate improved tissue quality and pliability as noted by improved hip and low back flexibility and ROM    Status On-going    Target Date 12/30/21      PT LONG TERM GOAL #5   Title Patient will demonstrate improved B LE strength to >/= 4/5 for improved stability and ease of mobility    Status On-going    Target Date 12/30/21      PT LONG TERM GOAL #6   Title Patient to report ability to perform ADLs, household, and leisure activities without limitation due to LBP or B hip pain, LOM or weakness     Status On-going    Target Date 12/30/21  Plan - 12/08/21 1413     Clinical Impression Statement Bethany Cole reports better compliance with HEP but feels that last therapy session and HEP performance causes her to remain sore for several days to the point that it limits her activity tolerance for daily activities such as walking her dog. Reviewed HEP adjusting frequency and modifying hip flexor stretch to help improve tolerance for exercises, hopefully w/o triggering increased lingering soreness. Introduced seated core strengthening from seated position focusing on abdominal muscle activation for stability with limited motions hip and knee strengthening with good tolerance but deferred HEP update until we are able to assess tolerance for modifications previously discussed.    Comorbidities HTN, Hyperthyroidism, Osteoporosis, B cataracts, OSA, GERD, Vit B12 deficiency, chronic LBP, B hip and B shoulder pain, leg length discrepancy; History of 2 falls with fracture of right wrist 2020, right ankle 2018 - Had PT after previous fractures    Rehab Potential Good    PT Frequency 2x / week    PT Duration 6 weeks    PT Treatment/Interventions ADLs/Self Care Home Management;Cryotherapy;Electrical Stimulation;Iontophoresis 4mg /ml Dexamethasone;Moist Heat;Traction;Ultrasound;Gait training;Stair training;Functional mobility training;Therapeutic activities;Therapeutic exercise;Balance training;Neuromuscular re-education;Patient/family education;Manual techniques;Passive range of motion;Dry needling;Taping;Spinal Manipulations;Joint Manipulations    PT Next Visit Plan progress core and proximal LE flexibilty and lumbopelvic strengthening; MT and modalities PRN for pain management    PT Home Exercise Plan Access Code: [email or web address] (1/31)    Consulted and Agree with Plan of Care Patient             Patient will benefit from skilled therapeutic intervention in order to improve the following  deficits and impairments:  Abnormal gait, Decreased activity tolerance, Decreased mobility, Decreased range of motion, Decreased strength, Difficulty walking, Increased fascial restricitons, Increased muscle spasms, Impaired perceived functional ability, Impaired flexibility, Improper body mechanics, Postural dysfunction, Decreased balance, Pain  Visit Diagnosis: Pain in right hip  Chronic midline low back pain without sciatica  Radiculopathy, lumbar region  Muscle weakness (generalized)  Other abnormalities of gait and mobility     Problem List Patient Active Problem List   Diagnosis Date Noted   Chronic pain of both feet 11/13/2021   Leg length discrepancy 11/13/2021   Vitamin B12 deficiency 09/02/2021   OSA on CPAP    Obesity hypoventilation syndrome ([place])    Fall 11/17/2020   Hyperlipemia 11/07/2020   Osteoporosis 09/30/2020   Hyperthyroidism    Hypertension    GERD (gastroesophageal reflux disease)    Gastritis    Cataracts, bilateral     Percival Spanish, PT 12/08/2021, 5:17 PM  [facility] [address]  [place], [place], [address] Phone: [phone]   Fax:  [phone]  Name: Bethany Cole MRN: [id number] Date of Birth: Jul 04, 1951

## 2021-12-08 NOTE — Patient Instructions (Signed)
° °  Access Code: [email or web address] URL: [email or web address] Date: 12/08/2021 Prepared by: Annie Paras  Exercises Hooklying Single Knee to Chest Stretch - 2 x daily - 7 x weekly - 3 reps - 30 sec hold Supine Piriformis Stretch with Foot on Ground - 2 x daily - 7 x weekly - 3 reps - 30 sec hold Supine Lower Trunk Rotation - 2 x daily - 7 x weekly - 5 reps - 10 sec hold Supine Hamstring Stretch with Strap - 2 x daily - 7 x weekly - 3 reps - 30 sec hold Supine ITB Stretch with Strap - 2 x daily - 7 x weekly - 3 reps - 30 sec hold Seated Hip Flexor Stretch - 1-2 x daily - 7 x weekly - 3 reps - 30 sec hold Supine Posterior Pelvic Tilt - 1 x daily - 7 x weekly - 2 sets - 10 reps - 5 sec hold Supine Hip Adduction Isometric with Ball - 1 x daily - 7 x weekly - 2 sets - 10 reps - 5 sec hold Hooklying Single Leg Bent Knee Fallouts with Resistance - 1 x daily - 7 x weekly - 2 sets - 10 reps - 3 sec hold Bridge with Resistance - 1 x daily - 7 x weekly - 2 sets - 10 reps - 5 sec hold

## 2021-12-11 ENCOUNTER — Ambulatory Visit: Payer: Medicare Other | Admitting: Physical Therapy

## 2021-12-11 ENCOUNTER — Encounter: Payer: Self-pay | Admitting: Physical Therapy

## 2021-12-11 ENCOUNTER — Other Ambulatory Visit: Payer: Self-pay

## 2021-12-11 DIAGNOSIS — G8929 Other chronic pain: Secondary | ICD-10-CM | POA: Diagnosis not present

## 2021-12-11 DIAGNOSIS — M6281 Muscle weakness (generalized): Secondary | ICD-10-CM | POA: Diagnosis not present

## 2021-12-11 DIAGNOSIS — I10 Essential (primary) hypertension: Secondary | ICD-10-CM | POA: Diagnosis not present

## 2021-12-11 DIAGNOSIS — R2689 Other abnormalities of gait and mobility: Secondary | ICD-10-CM | POA: Diagnosis not present

## 2021-12-11 DIAGNOSIS — M545 Low back pain, unspecified: Secondary | ICD-10-CM

## 2021-12-11 DIAGNOSIS — R2681 Unsteadiness on feet: Secondary | ICD-10-CM | POA: Diagnosis not present

## 2021-12-11 DIAGNOSIS — G4733 Obstructive sleep apnea (adult) (pediatric): Secondary | ICD-10-CM | POA: Diagnosis not present

## 2021-12-11 DIAGNOSIS — M25551 Pain in right hip: Secondary | ICD-10-CM

## 2021-12-11 DIAGNOSIS — M5416 Radiculopathy, lumbar region: Secondary | ICD-10-CM

## 2021-12-11 NOTE — Therapy (Signed)
[facility] [address]  [place], [place], [address] Phone: [phone]   Fax:  [phone]  Physical Therapy Treatment  Patient Details  Name: Bethany Cole MRN: [id number] Date of Birth: [birth year] Referring Provider (PT): [name], MD   Encounter Date: 12/11/2021   PT End of Session - 12/11/21 0930     Visit Number 5    Number of Visits 12    Date for PT Re-Evaluation 12/30/21    Authorization Type [employer] Medicare    PT Start Time 0930    PT Stop Time 1014    PT Time Calculation (min) 44 min    Activity Tolerance Patient tolerated treatment well    Behavior During Therapy WFL for tasks assessed/performed             Past Medical History:  Diagnosis Date   Cataracts, bilateral    Gastritis    GERD (gastroesophageal reflux disease)    Hypertension    Hyperthyroidism    OSA on CPAP    Squamous cell carcinoma of skin     Past Surgical History:  Procedure Laterality Date   ABDOMINAL HYSTERECTOMY     ENDOMETRIAL ABLATION     HERNIA REPAIR     as an infant   UPPER GASTROINTESTINAL ENDOSCOPY  11/09/2021   Has had sevearal in [place] ARTHROSCOPY Right     There were no vitals filed for this visit.   Subjective Assessment - 12/11/21 0932     Subjective Pt felt better after last visit and did well with alternating the stretches and strengthening exercises but agggravated her hip yesterday by going shopping at multiple big box stores - she feels like walking on the concrete for long periods flares her up.    Pertinent History HTN, Hyperthyroidism, Osteoporosis, B cataracts, OSA, GERD, Vit B12 deficiency, chronic LBP, B hip and B shoulder pain, leg length discrepancy; History of 2 falls with fracture of right wrist 2020, right ankle 2018 - Had PT after previous fractures    Diagnostic tests MRI R hip - 05/03/21: 1. No acute findings or clear explanation for the patient's symptoms. Minimal  degenerative changes of the hips.  2. Mild asymmetric right gluteus tendinosis without tear.  3. Lower lumbar facet arthropathy with asymmetric involvement on the left at L4-5, potentially contributing to low back pain.   Lumbar x-ray - 10/23/21: Mild curvature lumbar spine, apex to the left. Grade 1 anterolisthesis of L4 versus L5. No other malalignment. No  fractures. Minimal degenerative disc disease. More significant lumbar facet degenerative changes. Calcified atherosclerosis in the abdominal aorta.    Patient Stated Goals "more flexibility and strength with no pain"    Currently in Pain? Yes    Pain Score 6     Pain Location Hip    Pain Orientation Right;Left   R>>L   Pain Descriptors / Indicators Aching;Sharp    Pain Radiating Towards aching pain into R thigh and lateral lower leg    Pain Frequency Constant    Pain Score 2    Pain Location Back    Pain Orientation Lower    Pain Descriptors / Indicators Aching    Pain Frequency Intermittent                               [facility] Adult PT Treatment/Exercise - 12/11/21 0930       Lumbar Exercises:  Stretches   Hip Flexor Stretch Right;Left;2 reps;30 seconds    Hip Flexor Stretch Limitations seated lunge position over edge of chair    ITB Stretch Right;2 reps;30 seconds    ITB Stretch Limitations standing lateral lean over back of chair w/o overhead reach    Piriformis Stretch Right;Left;1 rep;30 seconds    Piriformis Stretch Limitations seated KTOS    Figure 4 Stretch 2 reps;30 seconds;Seated    Figure 4 Stretch Limitations better tolerated in sitting with hip hinge than supine/hooklying      Lumbar Exercises: Seated   Long Arc Quad on Chair Both;10 reps;Strengthening    LAQ on Chair Limitations cues for TrA engagement taking care not to slouch or round back    Other Seated Lumbar Exercises TrA isometric 10 x 5"   hands pressing into upper thighs     Knee/Hip Exercises: Aerobic   Nustep L4 x 6 min (LE only)       Knee/Hip Exercises: Seated   Ball Squeeze TrA + hip ADD ball/pillow squeeze isometric 10 x 5"    Clamshell with TheraBand Red   alt single LE hip ABD/ER 10 x 3"                    PT Education - 12/11/21 1014     Education Details HEP modification - majority of stretches and strengthening exercises transitioned to sitting per pt preference    Person(s) Educated Patient    Methods Explanation;Demonstration;Verbal cues;Handout    Comprehension Verbalized understanding;Verbal cues required;Returned demonstration;Need further instruction              PT Short Term Goals - 12/11/21 0940       PT SHORT TERM GOAL #1   Title Patient will be independent with initial HEP    Status Partially Met   12/11/21 - majority of HEP transitioned to seated or standing per pt preference with better tolerance reported - will plan to assess long-term response next visit   Target Date 12/09/21               PT Long Term Goals - 12/08/21 1414       PT LONG TERM GOAL #1   Title Patient will be independent with ongoing/advanced HEP for self-management at home    Status On-going    Target Date 12/30/21      PT LONG TERM GOAL #2   Title Patient will verbalize/demonstrate good awareness of neutral spine posture and proper body mechanics for daily tasks    Status On-going    Target Date 12/30/21      PT LONG TERM GOAL #3   Title Patient to report reduction in frequency and intensity of LBP and B hip pain by >/= 50-75% to allow for improved activity tolerance    Status On-going    Target Date 12/30/21      PT LONG TERM GOAL #4   Title Patient to demonstrate improved tissue quality and pliability as noted by improved hip and low back flexibility and ROM    Status On-going    Target Date 12/30/21      PT LONG TERM GOAL #5   Title Patient will demonstrate improved B LE strength to >/= 4/5 for improved stability and ease of mobility    Status On-going    Target Date 12/30/21       PT LONG TERM GOAL #6   Title Patient to report ability to perform ADLs, household, and leisure activities  without limitation due to LBP or B hip pain, LOM or weakness    Status On-going    Target Date 12/30/21                   Plan - 12/11/21 0941     Clinical Impression Statement Rosmary reports modifications of some of HEP to sitting last visit really seemed to make HEP work better, therefore offered seated or standing alternatives for the majority of her HEP stretches and strengthening exercises with pt noting preference for upright options for all exercises and stretches - HEP updated to reflect preferred stretches/exercises. Given preference for seated/upright positioning, will plan to continue to progress exercises in upright positions as appropriate in upcoming visits.    Comorbidities HTN, Hyperthyroidism, Osteoporosis, B cataracts, OSA, GERD, Vit B12 deficiency, chronic LBP, B hip and B shoulder pain, leg length discrepancy; History of 2 falls with fracture of right wrist 2020, right ankle 2018 - Had PT after previous fractures    Rehab Potential Good    PT Frequency 2x / week    PT Duration 6 weeks    PT Treatment/Interventions ADLs/Self Care Home Management;Cryotherapy;Electrical Stimulation;Iontophoresis 4mg /ml Dexamethasone;Moist Heat;Traction;Ultrasound;Gait training;Stair training;Functional mobility training;Therapeutic activities;Therapeutic exercise;Balance training;Neuromuscular re-education;Patient/family education;Manual techniques;Passive range of motion;Dry needling;Taping;Spinal Manipulations;Joint Manipulations    PT Next Visit Plan reassess STG; progress core and proximal LE flexibilty and lumbopelvic strengthening (from upright positions as appropriate per pt preference); MT and modalities PRN for pain management    PT Home Exercise Plan Access Code: [email or web address] (1/31, updated 2/17)    Consulted and Agree with Plan of Care Patient             Patient will  benefit from skilled therapeutic intervention in order to improve the following deficits and impairments:  Abnormal gait, Decreased activity tolerance, Decreased mobility, Decreased range of motion, Decreased strength, Difficulty walking, Increased fascial restricitons, Increased muscle spasms, Impaired perceived functional ability, Impaired flexibility, Improper body mechanics, Postural dysfunction, Decreased balance, Pain  Visit Diagnosis: Pain in right hip  Chronic midline low back pain without sciatica  Radiculopathy, lumbar region  Muscle weakness (generalized)  Other abnormalities of gait and mobility     Problem List Patient Active Problem List   Diagnosis Date Noted   Chronic pain of both feet 11/13/2021   Leg length discrepancy 11/13/2021   Vitamin B12 deficiency 09/02/2021   OSA on CPAP    Obesity hypoventilation syndrome ([place])    Fall 11/17/2020   Hyperlipemia 11/07/2020   Osteoporosis 09/30/2020   Hyperthyroidism    Hypertension    GERD (gastroesophageal reflux disease)    Gastritis    Cataracts, bilateral     Percival Spanish, PT 12/11/2021, 12:38 PM  [facility] [address]  [place], [place], [address] Phone: [phone]   Fax:  [phone]  Name: Karista Aispuro MRN: [id number] Date of Birth: 1951-08-11

## 2021-12-11 NOTE — Patient Instructions (Signed)
° ° °  Access Code: [email or web address] URL: [email or web address] Date: 12/11/2021 Prepared by: Annie Paras  Exercises Hooklying Single Knee to Chest Stretch - 2 x daily - 7 x weekly - 3 reps - 30 sec hold Supine Lower Trunk Rotation - 2 x daily - 7 x weekly - 5 reps - 10 sec hold Supine Posterior Pelvic Tilt - 1 x daily - 7 x weekly - 2 sets - 10 reps - 5 sec hold Bridge with Resistance - 1 x daily - 7 x weekly - 2 sets - 10 reps - 5 sec hold Seated Hip Flexor Stretch - 1-2 x daily - 7 x weekly - 3 reps - 30 sec hold Seated Piriformis Stretch - 1-2 x daily - 7 x weekly - 3 reps - 30 sec hold Seated Piriformis Stretch - 1-2 x daily - 7 x weekly - 3 reps - 30 sec hold Seated Hamstring Stretch with Strap - 1-2 x daily - 7 x weekly - 3 reps - 30 sec hold Standing ITB Stretch - 1-2 x daily - 7 x weekly - 3 reps - 30 sec hold Seated Transversus Abdominis Bracing - 1 x daily - 7 x weekly - 2 sets - 10 reps - 3 sec hold Seated Isometric Hip Adduction with Ball - 1 x daily - 7 x weekly - 2 sets - 10 reps - 3 sec hold Seated Isometric Hip Abduction with Resistance - 1 x daily - 7 x weekly - 2 sets - 10 reps - 3 sec hold Seated Long Arc Quad - 1 x daily - 7 x weekly - 2 sets - 10 reps - 3 sec hold

## 2021-12-15 ENCOUNTER — Encounter: Payer: Medicare Other | Admitting: Physical Therapy

## 2021-12-16 ENCOUNTER — Other Ambulatory Visit: Payer: Self-pay

## 2021-12-16 ENCOUNTER — Ambulatory Visit: Payer: Medicare Other | Admitting: Physical Therapy

## 2021-12-16 ENCOUNTER — Encounter: Payer: Self-pay | Admitting: Physical Therapy

## 2021-12-16 DIAGNOSIS — M6281 Muscle weakness (generalized): Secondary | ICD-10-CM

## 2021-12-16 DIAGNOSIS — M25551 Pain in right hip: Secondary | ICD-10-CM

## 2021-12-16 DIAGNOSIS — M5416 Radiculopathy, lumbar region: Secondary | ICD-10-CM | POA: Diagnosis not present

## 2021-12-16 DIAGNOSIS — G8929 Other chronic pain: Secondary | ICD-10-CM

## 2021-12-16 DIAGNOSIS — R2689 Other abnormalities of gait and mobility: Secondary | ICD-10-CM

## 2021-12-16 DIAGNOSIS — R2681 Unsteadiness on feet: Secondary | ICD-10-CM | POA: Diagnosis not present

## 2021-12-16 DIAGNOSIS — M545 Low back pain, unspecified: Secondary | ICD-10-CM | POA: Diagnosis not present

## 2021-12-16 NOTE — Therapy (Signed)
[facility] [address]  [place], [place], [address] Phone: [phone]   Fax:  [phone]  Physical Therapy Treatment  Patient Details  Name: Bethany Cole MRN: [id number] Date of Birth: 1951/05/28 Referring Provider (PT): [name], MD   Encounter Date: 12/16/2021   PT End of Session - 12/16/21 1149     Visit Number 6    Number of Visits 12    Date for PT Re-Evaluation 12/30/21    Authorization Type U[employer] Medicare    PT Start Time 1149    PT Stop Time 1230    PT Time Calculation (min) 41 min    Activity Tolerance Patient tolerated treatment well    Behavior During Therapy WFL for tasks assessed/performed             Past Medical History:  Diagnosis Date   Cataracts, bilateral    Gastritis    GERD (gastroesophageal reflux disease)    Hypertension    Hyperthyroidism    OSA on CPAP    Squamous cell carcinoma of skin     Past Surgical History:  Procedure Laterality Date   ABDOMINAL HYSTERECTOMY     ENDOMETRIAL ABLATION     HERNIA REPAIR     as an infant   UPPER GASTROINTESTINAL ENDOSCOPY  11/09/2021   Has had sevearal in [place] ARTHROSCOPY Right     There were no vitals filed for this visit.   Subjective Assessment - 12/16/21 1151     Subjective Pt reports better confidence with seated version of exercises modified last session. She has been completing the exercises qod and is noticing a difference with much less pain in distal LE. Increased R hip pain after walking her dog longer than she should have.    Pertinent History HTN, Hyperthyroidism, Osteoporosis, B cataracts, OSA, GERD, Vit B12 deficiency, chronic LBP, B hip and B shoulder pain, leg length discrepancy; History of 2 falls with fracture of right wrist 2020, right ankle 2018 - Had PT after previous fractures    Diagnostic tests MRI R hip - 05/03/21: 1. No acute findings or clear explanation for the patient's symptoms. Minimal  degenerative changes of the hips.  2. Mild asymmetric right gluteus tendinosis without tear.  3. Lower lumbar facet arthropathy with asymmetric involvement on the left at L4-5, potentially contributing to low back pain.   Lumbar x-ray - 10/23/21: Mild curvature lumbar spine, apex to the left. Grade 1 anterolisthesis of L4 versus L5. No other malalignment. No  fractures. Minimal degenerative disc disease. More significant lumbar facet degenerative changes. Calcified atherosclerosis in the abdominal aorta.    Patient Stated Goals "more flexibility and strength with no pain"    Currently in Pain? Yes    Pain Score 7     Pain Location Hip    Pain Orientation Right    Pain Descriptors / Indicators Tightness;Aching    Pain Type Chronic pain    Pain Frequency Constant    Pain Score 5    Pain Location Back    Pain Orientation Lower    Pain Descriptors / Indicators Aching    Pain Type Acute pain;Chronic pain                               [facility] Adult PT Treatment/Exercise - 12/16/21 1149       Lumbar Exercises: Stretches   Other Lumbar Stretch Exercise  Seated 3-way lumbar flexion stretch with cane for UB support x 30 sec each position      Lumbar Exercises: Standing   Heel Raises 5 reps;3 seconds    Heel Raises Limitations cues for quad, glute and TrA activation; UE support on counter   pt noting difficulty with R ankle due to h/o ankle fracture   Functional Squats 5 reps;3 seconds    Functional Squats Limitations counter mini-sqaut - cues to avoid knees fwd of toes and keep torso upright    Row Both;10 reps;Strengthening;Theraband    Theraband Level (Row) Level 2 (Red)    Row Limitations cues for TrA activation & scap retraction    Shoulder Extension Both;10 reps;Strengthening;Theraband    Theraband Level (Shoulder Extension) Level 2 (Red)    Shoulder Extension Limitations cues for TrA activation & scap retraction, avoiding trunk/hip flexion    Other Standing Lumbar  Exercises R/L red TB pallof press x 10      Knee/Hip Exercises: Aerobic   Recumbent Bike L3 x 6 min                     PT Education - 12/16/21 1230     Education Details HEP update - standing progression    Person(s) Educated Patient    Methods Explanation;Demonstration;Verbal cues;Handout    Comprehension Verbalized understanding;Verbal cues required;Returned demonstration;Need further instruction              PT Short Term Goals - 12/16/21 1156       PT SHORT TERM GOAL #1   Title Patient will be independent with initial HEP    Status Achieved   12/16/21              PT Long Term Goals - 12/08/21 1414       PT LONG TERM GOAL #1   Title Patient will be independent with ongoing/advanced HEP for self-management at home    Status On-going    Target Date 12/30/21      PT LONG TERM GOAL #2   Title Patient will verbalize/demonstrate good awareness of neutral spine posture and proper body mechanics for daily tasks    Status On-going    Target Date 12/30/21      PT LONG TERM GOAL #3   Title Patient to report reduction in frequency and intensity of LBP and B hip pain by >/= 50-75% to allow for improved activity tolerance    Status On-going    Target Date 12/30/21      PT LONG TERM GOAL #4   Title Patient to demonstrate improved tissue quality and pliability as noted by improved hip and low back flexibility and ROM    Status On-going    Target Date 12/30/21      PT LONG TERM GOAL #5   Title Patient will demonstrate improved B LE strength to >/= 4/5 for improved stability and ease of mobility    Status On-going    Target Date 12/30/21      PT LONG TERM GOAL #6   Title Patient to report ability to perform ADLs, household, and leisure activities without limitation due to LBP or B hip pain, LOM or weakness    Status On-going    Target Date 12/30/21                   Plan - 12/16/21 1230     Clinical Impression Statement Ethylene reports  seated versions of exercises in modified HEP are  much more comfortable and are working much better for her and she has been able to complete more consistently every other day since last visit w/o increased pain - STG met. She notes decreasing distal LE radicular pain but does have some increased R hip pain today which she attributes to walking too long with her dog yesterday. She notes improving walking tolerance when she tries to draw herself up tall, therefore todays session focusing on standing exercises to help her recruit core, abdominal and postural muscles - pt requesting HEP instructions for these exercises as well as guidance in a stretch she has use for her back after walking, therefore provided instructions for seated 3-way prayer stretch using a cane for upper body support.    Comorbidities HTN, Hyperthyroidism, Osteoporosis, B cataracts, OSA, GERD, Vit B12 deficiency, chronic LBP, B hip and B shoulder pain, leg length discrepancy; History of 2 falls with fracture of right wrist 2020, right ankle 2018 - Had PT after previous fractures    Rehab Potential Good    PT Frequency 2x / week    PT Duration 6 weeks    PT Treatment/Interventions ADLs/Self Care Home Management;Cryotherapy;Electrical Stimulation;Iontophoresis 4mg /ml Dexamethasone;Moist Heat;Traction;Ultrasound;Gait training;Stair training;Functional mobility training;Therapeutic activities;Therapeutic exercise;Balance training;Neuromuscular re-education;Patient/family education;Manual techniques;Passive range of motion;Dry needling;Taping;Spinal Manipulations;Joint Manipulations    PT Next Visit Plan progress core and proximal LE flexibilty and lumbopelvic strengthening (from upright positions as appropriate per pt preference); MT and modalities PRN for pain management    PT Home Exercise Plan Access Code: [email or web address] (1/31, updated 2/17 & 2/22)    Consulted and Agree with Plan of Care Patient             Patient will benefit from  skilled therapeutic intervention in order to improve the following deficits and impairments:  Abnormal gait, Decreased activity tolerance, Decreased mobility, Decreased range of motion, Decreased strength, Difficulty walking, Increased fascial restricitons, Increased muscle spasms, Impaired perceived functional ability, Impaired flexibility, Improper body mechanics, Postural dysfunction, Decreased balance, Pain  Visit Diagnosis: Pain in right hip  Chronic midline low back pain without sciatica  Radiculopathy, lumbar region  Muscle weakness (generalized)  Other abnormalities of gait and mobility     Problem List Patient Active Problem List   Diagnosis Date Noted   Chronic pain of both feet 11/13/2021   Leg length discrepancy 11/13/2021   Vitamin B12 deficiency 09/02/2021   OSA on CPAP    Obesity hypoventilation syndrome ([place])    Fall 11/17/2020   Hyperlipemia 11/07/2020   Osteoporosis 09/30/2020   Hyperthyroidism    Hypertension    GERD (gastroesophageal reflux disease)    Gastritis    Cataracts, bilateral     Percival Spanish, PT 12/16/2021, 12:45 PM  [facility] [address]  [place], [place], [address] Phone: [phone]   Fax:  [phone]  Name: Tricia Pledger MRN: [id number] Date of Birth: 14-Sep-1951

## 2021-12-16 NOTE — Patient Instructions (Signed)
° ° °  Access Code: [email or web address] URL: [email or web address] Date: 12/16/2021 Prepared by: Annie Paras  Exercises Hooklying Single Knee to Chest Stretch - 2 x daily - 7 x weekly - 3 reps - 30 sec hold Supine Lower Trunk Rotation - 2 x daily - 7 x weekly - 5 reps - 10 sec hold Supine Posterior Pelvic Tilt - 1 x daily - 7 x weekly - 2 sets - 10 reps - 5 sec hold Bridge with Resistance - 1 x daily - 7 x weekly - 2 sets - 10 reps - 5 sec hold Seated Hip Flexor Stretch - 1-2 x daily - 7 x weekly - 3 reps - 30 sec hold Seated Piriformis Stretch - 1-2 x daily - 7 x weekly - 3 reps - 30 sec hold Seated Piriformis Stretch - 1-2 x daily - 7 x weekly - 3 reps - 30 sec hold Seated Hamstring Stretch with Strap - 1-2 x daily - 7 x weekly - 3 reps - 30 sec hold Standing ITB Stretch - 1-2 x daily - 7 x weekly - 3 reps - 30 sec hold Seated Transversus Abdominis Bracing - 1 x daily - 7 x weekly - 2 sets - 10 reps - 3 sec hold Seated Isometric Hip Adduction with Ball - 1 x daily - 7 x weekly - 2 sets - 10 reps - 3 sec hold Seated Isometric Hip Abduction with Resistance - 1 x daily - 7 x weekly - 2 sets - 10 reps - 3 sec hold Seated Long Arc Quad - 1 x daily - 7 x weekly - 2 sets - 10 reps - 3 sec hold Seated Flexion Stretch with Swiss Ball - 1 x daily - 7 x weekly - 3 reps - 30 sec hold Seated Thoracic Flexion and Rotation with Swiss Ball - 1 x daily - 7 x weekly - 3 reps - 30 sec hold Standing Shoulder Row with Anchored Resistance - 1 x daily - 3-4 x weekly - 2 sets - 10 reps - 5 sec hold Scapular Retraction with Resistance Advanced - 1 x daily - 3-4 x weekly - 2 sets - 10 reps - 5 sec hold Heel Raises with Counter Support - 1 x daily - 3-4 x weekly - 2 sets - 5 reps - 3 sec hold Mini Squat with Counter Support - 1 x daily - 3-4 x weekly - 2 sets - 5 reps - 3-5 sec hold

## 2021-12-18 DIAGNOSIS — H52201 Unspecified astigmatism, right eye: Secondary | ICD-10-CM | POA: Diagnosis not present

## 2021-12-18 DIAGNOSIS — H2511 Age-related nuclear cataract, right eye: Secondary | ICD-10-CM | POA: Diagnosis not present

## 2021-12-22 ENCOUNTER — Other Ambulatory Visit: Payer: Self-pay

## 2021-12-22 ENCOUNTER — Ambulatory Visit: Payer: Medicare Other | Admitting: Physical Therapy

## 2021-12-22 ENCOUNTER — Encounter: Payer: Self-pay | Admitting: Physical Therapy

## 2021-12-22 DIAGNOSIS — R2681 Unsteadiness on feet: Secondary | ICD-10-CM

## 2021-12-22 DIAGNOSIS — M25551 Pain in right hip: Secondary | ICD-10-CM

## 2021-12-22 DIAGNOSIS — M545 Low back pain, unspecified: Secondary | ICD-10-CM | POA: Diagnosis not present

## 2021-12-22 DIAGNOSIS — M5416 Radiculopathy, lumbar region: Secondary | ICD-10-CM | POA: Diagnosis not present

## 2021-12-22 DIAGNOSIS — M6281 Muscle weakness (generalized): Secondary | ICD-10-CM | POA: Diagnosis not present

## 2021-12-22 DIAGNOSIS — R2689 Other abnormalities of gait and mobility: Secondary | ICD-10-CM | POA: Diagnosis not present

## 2021-12-22 DIAGNOSIS — G8929 Other chronic pain: Secondary | ICD-10-CM

## 2021-12-22 NOTE — Patient Instructions (Signed)
° °  Access Code: [email or web address] URL: [email or web address] Date: 12/22/2021 Prepared by: Annie Paras  Exercises Hooklying Single Knee to Chest Stretch - 2 x daily - 7 x weekly - 3 reps - 30 sec hold Supine Lower Trunk Rotation - 2 x daily - 7 x weekly - 5 reps - 10 sec hold Supine Posterior Pelvic Tilt - 1 x daily - 7 x weekly - 2 sets - 10 reps - 5 sec hold Bridge with Resistance - 1 x daily - 7 x weekly - 2 sets - 10 reps - 5 sec hold Seated Hip Flexor Stretch - 1-2 x daily - 7 x weekly - 3 reps - 30 sec hold Seated Piriformis Stretch - 1-2 x daily - 7 x weekly - 3 reps - 30 sec hold Seated Piriformis Stretch - 1-2 x daily - 7 x weekly - 3 reps - 30 sec hold Seated Hamstring Stretch with Strap - 1-2 x daily - 7 x weekly - 3 reps - 30 sec hold Standing ITB Stretch - 1-2 x daily - 7 x weekly - 3 reps - 30 sec hold Seated Transversus Abdominis Bracing - 1 x daily - 7 x weekly - 2 sets - 10 reps - 3 sec hold Seated Isometric Hip Adduction with Ball - 1 x daily - 7 x weekly - 2 sets - 10 reps - 3 sec hold Seated Isometric Hip Abduction with Resistance - 1 x daily - 7 x weekly - 2 sets - 10 reps - 3 sec hold Seated Long Arc Quad - 1 x daily - 7 x weekly - 2 sets - 10 reps - 3 sec hold Seated Flexion Stretch with Swiss Ball - 1 x daily - 7 x weekly - 3 reps - 30 sec hold Seated Thoracic Flexion and Rotation with Swiss Ball - 1 x daily - 7 x weekly - 3 reps - 30 sec hold Standing Shoulder Row with Anchored Resistance - 1 x daily - 3-4 x weekly - 2 sets - 10 reps - 5 sec hold Scapular Retraction with Resistance Advanced - 1 x daily - 3-4 x weekly - 2 sets - 10 reps - 5 sec hold Heel Raises with Counter Support - 1 x daily - 3-4 x weekly - 2 sets - 5 reps - 3 sec hold Mini Squat with Counter Support - 1 x daily - 3-4 x weekly - 2 sets - 5 reps - 3-5 sec hold Standing Hip Abduction with Resistance at Ankles and Counter Support - 1 x daily - 3-4 x weekly - 2 sets - 10 reps - 3 sec  hold Standing Hip Extension with Resistance at Ankles and Counter Support - 1 x daily - 3-4 x weekly - 2 sets - 10 reps - 3 sec hold Standing March with Counter Support - 1 x daily - 3-4 x weekly - 2 sets - 10 reps - 2-3 sec hold

## 2021-12-22 NOTE — Therapy (Signed)
[facility] [address]  [place], [place], [address] Phone: [phone]   Fax:  [phone]  Physical Therapy Treatment  Patient Details  Name: Bethany Cole MRN: [id number] Date of Birth: 1951/10/01 Referring Provider (PT): [name], MD   Encounter Date: 12/22/2021   PT End of Session - 12/22/21 0930     Visit Number 7    Number of Visits 12    Date for PT Re-Evaluation 12/30/21    Authorization Type [employer]HC Medicare    PT Start Time 0930    PT Stop Time 1014    PT Time Calculation (min) 44 min    Activity Tolerance Patient tolerated treatment well    Behavior During Therapy WFL for tasks assessed/performed             Past Medical History:  Diagnosis Date   Cataracts, bilateral    Gastritis    GERD (gastroesophageal reflux disease)    Hypertension    Hyperthyroidism    OSA on CPAP    Squamous cell carcinoma of skin     Past Surgical History:  Procedure Laterality Date   ABDOMINAL HYSTERECTOMY     ENDOMETRIAL ABLATION     HERNIA REPAIR     as an infant   UPPER GASTROINTESTINAL ENDOSCOPY  11/09/2021   Has had sevearal in [place] ARTHROSCOPY Right     There were no vitals filed for this visit.   Subjective Assessment - 12/22/21 0932     Subjective Pt reports her hip pain is going down. She has been able to walk her dog around the block 2 times w/o pain down the leg although hip still usually starting to hurt by the end of the walk.    Pertinent History HTN, Hyperthyroidism, Osteoporosis, B cataracts, OSA, GERD, Vit B12 deficiency, chronic LBP, B hip and B shoulder pain, leg length discrepancy; History of 2 falls with fracture of right wrist 2020, right ankle 2018 - Had PT after previous fractures    Diagnostic tests MRI R hip - 05/03/21: 1. No acute findings or clear explanation for the patient's symptoms. Minimal degenerative changes of the hips.  2. Mild asymmetric right gluteus  tendinosis without tear.  3. Lower lumbar facet arthropathy with asymmetric involvement on the left at L4-5, potentially contributing to low back pain.   Lumbar x-ray - 10/23/21: Mild curvature lumbar spine, apex to the left. Grade 1 anterolisthesis of L4 versus L5. No other malalignment. No  fractures. Minimal degenerative disc disease. More significant lumbar facet degenerative changes. Calcified atherosclerosis in the abdominal aorta.    Patient Stated Goals "more flexibility and strength with no pain"    Currently in Pain? Yes    Pain Score 4     Pain Location Hip    Pain Orientation Right    Pain Descriptors / Indicators Tightness    Pain Type Chronic pain    Pain Frequency Constant    Pain Score 5    Pain Location Back    Pain Orientation Lower    Pain Descriptors / Indicators Aching    Pain Type Acute pain;Chronic pain    Pain Frequency Intermittent                               [facility] Adult PT Treatment/Exercise - 12/22/21 0930       Lumbar Exercises: Stretches   Other Lumbar Stretch  Exercise Seated 3-way lumbar flexion stretch with cane for UB support x 30 sec each position      Lumbar Exercises: Standing   Heel Raises 10 reps;3 seconds    Heel Raises Limitations cues for quad, glute and TrA activation; UE support on counter   no issues with R ankle today   Functional Squats 10 reps;3 seconds   2 sets   Functional Squats Limitations counter mini-sqaut - cues for posterior weight shift to avoid knees fwd of toes, keep knees aligned with hip and ankles (avoid genu valgum) and keep torso upright; 2nd set + red TB hip ABD isometric    Row Both;10 reps;Strengthening;Theraband    Theraband Level (Row) Level 2 (Red)    Row Limitations cues for TrA activation & scap retraction and keep forearms parallel to floor,  avoiding shoulder shrug    Shoulder Extension Both;10 reps;Strengthening;Theraband    Theraband Level (Shoulder Extension) Level 2 (Red)    Shoulder  Extension Limitations cues for TrA activation & scap retraction, avoiding trunk/hip flexion      Knee/Hip Exercises: Aerobic   Recumbent Bike L3 x 6 min      Knee/Hip Exercises: Standing   Hip Abduction Both;10 reps;Stengthening;Knee straight    Abduction Limitations looped red TB at knees; UE support on back of chair   too difficult with band at ankles   Hip Extension Both;10 reps;Stengthening    Extension Limitations looped red TB at knees; UE support on back of chair                     PT Education - 12/22/21 1012     Education Details HEP update - standing hip strengthening    Person(s) Educated Patient    Methods Explanation;Demonstration;Verbal cues;Handout    Comprehension Verbalized understanding;Verbal cues required;Returned demonstration;Need further instruction              PT Short Term Goals - 12/16/21 1156       PT SHORT TERM GOAL #1   Title Patient will be independent with initial HEP    Status Achieved   12/16/21              PT Long Term Goals - 12/22/21 0936       PT LONG TERM GOAL #1   Title Patient will be independent with ongoing/advanced HEP for self-management at home    Status Partially Met    Target Date 12/30/21      PT LONG TERM GOAL #2   Title Patient will verbalize/demonstrate good awareness of neutral spine posture and proper body mechanics for daily tasks    Status On-going    Target Date 12/30/21      PT LONG TERM GOAL #3   Title Patient to report reduction in frequency and intensity of LBP and B hip pain by >/= 50-75% to allow for improved activity tolerance    Status On-going    Target Date 12/30/21      PT LONG TERM GOAL #4   Title Patient to demonstrate improved tissue quality and pliability as noted by improved hip and low back flexibility and ROM    Status On-going    Target Date 12/30/21      PT LONG TERM GOAL #5   Title Patient will demonstrate improved B LE strength to >/= 4/5 for improved stability  and ease of mobility    Status On-going    Target Date 12/30/21      PT  LONG TERM GOAL #6   Title Patient to report ability to perform ADLs, household, and leisure activities without limitation due to LBP or B hip pain, LOM or weakness    Status Partially Met   12/22/21 - improving tolernace for walking the dog   Target Date 12/30/21                   Plan - 12/22/21 1014     Clinical Impression Statement Juliani reports improvement in her R hip and radicular LE pain, allowing her to walk her dog around the block 2 times without pain down the leg, although hip still usually starting to hurt by the end of the walk. Due to her eye surgery, she did get to work on the newer HEP exercises and wanted to review these today. Cues necessary for clarification of proper technique and to avoid substitution or malalignment with several of the exercises, but pt reporting improved comfort following review. Progressed hip strengthening with addition of standing 3-way hip with red TB at knees (too difficult when attempted with TB at ankles) - pt requesting HEP instructions but cautioned pt to make sure she is rotating through her strengthening exercises as her HEP is becoming quite extensive.    Comorbidities HTN, Hyperthyroidism, Osteoporosis, B cataracts, OSA, GERD, Vit B12 deficiency, chronic LBP, B hip and B shoulder pain, leg length discrepancy; History of 2 falls with fracture of right wrist 2020, right ankle 2018 - Had PT after previous fractures    Rehab Potential Good    PT Frequency 2x / week    PT Duration 6 weeks    PT Treatment/Interventions ADLs/Self Care Home Management;Cryotherapy;Electrical Stimulation;Iontophoresis 4mg /ml Dexamethasone;Moist Heat;Traction;Ultrasound;Gait training;Stair training;Functional mobility training;Therapeutic activities;Therapeutic exercise;Balance training;Neuromuscular re-education;Patient/family education;Manual techniques;Passive range of motion;Dry  needling;Taping;Spinal Manipulations;Joint Manipulations    PT Next Visit Plan progress core and proximal LE flexibilty and lumbopelvic strengthening (from upright positions as appropriate per pt preference); MT and modalities PRN for pain management    PT Home Exercise Plan Access Code: [email or web address] (1/31, updated 2/17 & 2/22)    Consulted and Agree with Plan of Care Patient             Patient will benefit from skilled therapeutic intervention in order to improve the following deficits and impairments:  Abnormal gait, Decreased activity tolerance, Decreased mobility, Decreased range of motion, Decreased strength, Difficulty walking, Increased fascial restricitons, Increased muscle spasms, Impaired perceived functional ability, Impaired flexibility, Improper body mechanics, Postural dysfunction, Decreased balance, Pain  Visit Diagnosis: Pain in right hip  Chronic midline low back pain without sciatica  Radiculopathy, lumbar region  Muscle weakness (generalized)  Other abnormalities of gait and mobility  Unsteadiness on feet     Problem List Patient Active Problem List   Diagnosis Date Noted   Chronic pain of both feet 11/13/2021   Leg length discrepancy 11/13/2021   Vitamin B12 deficiency 09/02/2021   OSA on CPAP    Obesity hypoventilation syndrome ([place])    Fall 11/17/2020   Hyperlipemia 11/07/2020   Osteoporosis 09/30/2020   Hyperthyroidism    Hypertension    GERD (gastroesophageal reflux disease)    Gastritis    Cataracts, bilateral     Percival Spanish, PT 12/22/2021, 10:23 AM  [facility] [address]  [place], [place], [address] Phone: [phone]   Fax:  [phone]  Name: Zalea Pete MRN: [id number] Date of Birth: 1950-11-30

## 2021-12-29 ENCOUNTER — Encounter: Payer: Self-pay | Admitting: Physical Therapy

## 2021-12-29 ENCOUNTER — Ambulatory Visit: Payer: Medicare Other | Attending: Family Medicine | Admitting: Physical Therapy

## 2021-12-29 ENCOUNTER — Other Ambulatory Visit: Payer: Self-pay

## 2021-12-29 DIAGNOSIS — M6281 Muscle weakness (generalized): Secondary | ICD-10-CM | POA: Insufficient documentation

## 2021-12-29 DIAGNOSIS — G8929 Other chronic pain: Secondary | ICD-10-CM | POA: Diagnosis not present

## 2021-12-29 DIAGNOSIS — R2689 Other abnormalities of gait and mobility: Secondary | ICD-10-CM | POA: Insufficient documentation

## 2021-12-29 DIAGNOSIS — M5416 Radiculopathy, lumbar region: Secondary | ICD-10-CM | POA: Diagnosis not present

## 2021-12-29 DIAGNOSIS — M545 Low back pain, unspecified: Secondary | ICD-10-CM | POA: Diagnosis not present

## 2021-12-29 DIAGNOSIS — M25551 Pain in right hip: Secondary | ICD-10-CM | POA: Insufficient documentation

## 2021-12-29 NOTE — Therapy (Signed)
[facility] [address]  [place], [place], [address] Phone: [phone]   Fax:  [phone]  Physical Therapy Treatment / Progress Note / Recert  Patient Details  Name: Bethany Cole MRN: [id number] Date of Birth: 18-Jun-1951 Referring Provider (PT): [name], MD  Progress Note  Reporting Period 11/18/2021 to 12/29/2021  See note below for Objective Data and Assessment of Progress/Goals.     Encounter Date: 12/29/2021   PT End of Session - 12/29/21 1018     Visit Number 8    Number of Visits 14    Date for PT Re-Evaluation 02/09/22    Authorization Type [employer] Medicare    PT Start Time 1018    PT Stop Time 1108    PT Time Calculation (min) 50 min    Activity Tolerance Patient tolerated treatment well    Behavior During Therapy WFL for tasks assessed/performed             Past Medical History:  Diagnosis Date   Cataracts, bilateral    Gastritis    GERD (gastroesophageal reflux disease)    Hypertension    Hyperthyroidism    OSA on CPAP    Squamous cell carcinoma of skin     Past Surgical History:  Procedure Laterality Date   ABDOMINAL HYSTERECTOMY     ENDOMETRIAL ABLATION     HERNIA REPAIR     as an infant   UPPER GASTROINTESTINAL ENDOSCOPY  11/09/2021   Has had sevearal in [place] ARTHROSCOPY Right     There were no vitals filed for this visit.   Subjective Assessment - 12/29/21 1021     Subjective Pt reports the pain down her leg is pretty much gone which is allowing her to walk more, but now notes more pain in the R buttock and at the base of her spine.    Pertinent History HTN, Hyperthyroidism, Osteoporosis, B cataracts, OSA, GERD, Vit B12 deficiency, chronic LBP, B hip and B shoulder pain, leg length discrepancy; History of 2 falls with fracture of right wrist 2020, right ankle 2018 - Had PT after previous fractures    Limitations Sitting;Standing;Walking;House hold  activities;Lifting    How long can you sit comfortably? 30 minutes    How long can you stand comfortably? 15 minutes    How long can you walk comfortably? 15-20 minutes    Diagnostic tests MRI R hip - 05/03/21: 1. No acute findings or clear explanation for the patient's symptoms. Minimal degenerative changes of the hips.  2. Mild asymmetric right gluteus tendinosis without tear.  3. Lower lumbar facet arthropathy with asymmetric involvement on the left at L4-5, potentially contributing to low back pain.   Lumbar x-ray - 10/23/21: Mild curvature lumbar spine, apex to the left. Grade 1 anterolisthesis of L4 versus L5. No other malalignment. No  fractures. Minimal degenerative disc disease. More significant lumbar facet degenerative changes. Calcified atherosclerosis in the abdominal aorta.    Patient Stated Goals "more flexibility and strength with no pain"    Currently in Pain? Yes    Pain Score 5    up to 7/10   Pain Location Buttocks    Pain Orientation Right    Pain Descriptors / Indicators Aching;Tightness    Pain Type Chronic pain    Pain Frequency Intermittent    Pain Score 4   when walking   Pain Location Ankle    Pain Orientation Right    Pain  Descriptors / Indicators Other (Comment)   "weak" and 'hurts"   Pain Type Chronic pain    Pain Onset --   9-10 months   Pain Frequency Intermittent    Effect of Pain on Daily Activities feels that her ankle will roll over if walking on uneven ground                [facility] PT Assessment - 12/29/21 1018       Assessment   Medical Diagnosis Chronic B hip pain & midline LBP    Referring Provider (PT) [name], MD    Onset Date/Surgical Date --   Fall 2022   Next MD Visit TBD - pt may make appt for her R ankle      Prior Function   Level of Independence Independent    Vocation Retired    Leisure gardening, reading, time with family, walking with friends, walking the dogs (daily - walks each dog separately)      AROM   Lumbar Flexion  hands to just above ankles - stiffness    Lumbar Extension 75% limited - stiffness & pain    Lumbar - Right Side Bend hand to lateral knee - pain > stiffness    Lumbar - Left Side Bend hand to lateral knee - stiffness > pain    Lumbar - Right Rotation 25% - stiffness > pain in LB + R hip pain    Lumbar - Left Rotation [facility]      Strength   Right Hip Flexion 4-/5    Right Hip Extension 3-/5    Right Hip External Rotation  4-/5    Right Hip Internal Rotation 4-/5    Right Hip ABduction 4-/5    Right Hip ADduction 3+/5    Left Hip Flexion 4/5    Left Hip Extension 3-/5    Left Hip External Rotation 4-/5    Left Hip Internal Rotation 4/5    Left Hip ABduction 4-/5    Left Hip ADduction 4-/5    Right Knee Flexion 4/5    Right Knee Extension 4/5    Left Knee Flexion 4/5    Left Knee Extension 4+/5    Right Ankle Dorsiflexion 4-/5    Right Ankle Plantar Flexion 4/5   9 SLS heel raises   Left Ankle Dorsiflexion 4/5    Left Ankle Plantar Flexion 4+/5   14 SLS heel raises     Flexibility   Hamstrings mild tight L>R    Quadriceps mild/mod tight R>L    ITB mild/mod tight R>L    Piriformis mild/mod tight R>L      Palpation   Palpation comment increased muscle tension with TTP over lumbar sacrum and R>L glutes, and R ITB/greater trochanter                           [facility] Adult PT Treatment/Exercise - 12/29/21 1018       Knee/Hip Exercises: Aerobic   Recumbent Bike L3 x 6 min                     PT Education - 12/29/21 1102     Education Details Posture and body mechanics education with emphasis on positioning and body mechanics related to gardening tasks    Person(s) Educated Patient    Methods Explanation;Demonstration;Handout    Comprehension Verbalized understanding              PT  Short Term Goals - 12/16/21 1156       PT SHORT TERM GOAL #1   Title Patient will be independent with initial HEP    Status Achieved   12/16/21               PT Long Term Goals - 12/29/21 1030       PT LONG TERM GOAL #1   Title Patient will be independent with ongoing/advanced HEP for self-management at home    Status Partially Met    Target Date 02/09/22      PT LONG TERM GOAL #2   Title Patient will verbalize/demonstrate good awareness of neutral spine posture and proper body mechanics for daily tasks    Status Achieved   12/29/21     PT LONG TERM GOAL #3   Title Patient to report reduction in frequency and intensity of LBP and B hip pain by >/= 50-75% to allow for improved activity tolerance    Status Partially Met   12/29/21 - Pt reports 20% reduction in R hip and LBP but L hip pain resolved and R LE pain nearly gone   Target Date 02/09/22      PT LONG TERM GOAL #4   Title Patient to demonstrate improved tissue quality and pliability as noted by improved hip and low back flexibility and ROM    Status Partially Met    Target Date 02/09/22      PT LONG TERM GOAL #5   Title Patient will demonstrate improved B LE strength to >/= 4/5 for improved stability and ease of mobility    Status Partially Met    Target Date 02/09/22      PT LONG TERM GOAL #6   Title Patient to report ability to perform ADLs, household, and leisure activities without limitation due to LBP or B hip pain, LOM or weakness    Status Partially Met   12/29/21 - improving tolerance for walking the dogs (able to walk ~30 minutes now but will note increasing pain after 15-20 minutes)   Target Date 02/09/22                   Plan - 12/29/21 1056     Clinical Impression Statement Anjanette reports the pain in her L hip is gone and the pain down her R LE is mostly resolved allowing for improving walking tolerance when taking her dogs for their walks but walking still limited by R buttock pain and midline LBP with only 20% improvement noted in these areas. Her lumbar ROM and flexibility is also improving for most motions but lumbar extension and R side-bending still  limited and painful. She reports much improved awareness of her posture but still questions best positions with activities such as gardening - posture and body mechanics educational handout provided, highlighting activities related to gardening including lifting mechanics and positioning relative to ground level and raised garden beds. Will plan to further review posture and body mechanics as pt needs in future visits. Overall LE strength has improved but LE weakness still evident R>L with greatest weakness proximally. Delainey has demonstrated good progress toward her goals with all STGs met and all LTGs at least partially met. She feels confident with her current HEP, but given above ongoing pain, ROM/flexibility and strength deficits, will recommend recert for additional 4-6 weeks with frequency reduced to 1x/wk.    Comorbidities HTN, Hyperthyroidism, Osteoporosis, B cataracts, OSA, GERD, Vit B12 deficiency, chronic LBP, B hip and B  shoulder pain, leg length discrepancy; History of 2 falls with fracture of right wrist 2020, right ankle 2018 - Had PT after previous fractures    Rehab Potential Good    PT Frequency 1x / week    PT Duration 6 weeks    PT Treatment/Interventions ADLs/Self Care Home Management;Cryotherapy;Electrical Stimulation;Iontophoresis 60m/ml Dexamethasone;Moist Heat;Traction;Ultrasound;Gait training;Stair training;Functional mobility training;Therapeutic activities;Therapeutic exercise;Balance training;Neuromuscular re-education;Patient/family education;Manual techniques;Passive range of motion;Dry needling;Taping;Spinal Manipulations;Joint Manipulations    PT Next Visit Plan progress core and proximal LE flexibilty and lumbopelvic strengthening (from upright positions as appropriate per pt preference); MT and modalities PRN for pain management    PT Home Exercise Plan Access Code: [email or web address](1/31, updated 2/17 & 2/22)    Consulted and Agree with Plan of Care Patient              Patient will benefit from skilled therapeutic intervention in order to improve the following deficits and impairments:  Abnormal gait, Decreased activity tolerance, Decreased mobility, Decreased range of motion, Decreased strength, Difficulty walking, Increased fascial restricitons, Increased muscle spasms, Impaired perceived functional ability, Impaired flexibility, Improper body mechanics, Postural dysfunction, Decreased balance, Pain  Visit Diagnosis: Chronic midline low back pain without sciatica - Plan: PT plan of care cert/re-cert  Pain in right hip - Plan: PT plan of care cert/re-cert  Muscle weakness (generalized) - Plan: PT plan of care cert/re-cert  Other abnormalities of gait and mobility - Plan: PT plan of care cert/re-cert  Radiculopathy, lumbar region - Plan: PT plan of care cert/re-cert     Problem List Patient Active Problem List   Diagnosis Date Noted   Chronic pain of both feet 11/13/2021   Leg length discrepancy 11/13/2021   Vitamin B12 deficiency 09/02/2021   OSA on CPAP    [place]    [place] 11/17/2020   Hyperlipemia 11/07/2020   Osteoporosis 09/30/2020   Hyperthyroidism    Hypertension    GERD (gastroesophageal reflux disease)    Gastritis    Cataracts, bilateral     JPercival Spanish PT 12/29/2021, 12:44 PM  [facility] [address] [place] [address]Phone: [phone]  Fax:  [phone] Name: EAliannah HolstromMRN: [id number] of Birth: [birth year]

## 2021-12-29 NOTE — Patient Instructions (Signed)

## 2021-12-30 ENCOUNTER — Ambulatory Visit (INDEPENDENT_AMBULATORY_CARE_PROVIDER_SITE_OTHER): Payer: Medicare Other

## 2021-12-30 DIAGNOSIS — E538 Deficiency of other specified B group vitamins: Secondary | ICD-10-CM

## 2021-12-30 MED ORDER — CYANOCOBALAMIN 1000 MCG/ML IJ SOLN
1000.0000 ug | INTRAMUSCULAR | Status: AC
  Administered 2021-12-30 – 2022-08-05 (×4): 1000 ug via INTRAMUSCULAR

## 2021-12-30 NOTE — Progress Notes (Signed)
Per orders of Dr. Jerilee Hoh, injection of B12 given by Rodrigo Ran. ?Patient tolerated injection well. ? ?

## 2022-01-08 ENCOUNTER — Other Ambulatory Visit: Payer: Self-pay

## 2022-01-08 ENCOUNTER — Ambulatory Visit: Payer: Medicare Other

## 2022-01-08 DIAGNOSIS — G4733 Obstructive sleep apnea (adult) (pediatric): Secondary | ICD-10-CM | POA: Diagnosis not present

## 2022-01-08 DIAGNOSIS — M545 Low back pain, unspecified: Secondary | ICD-10-CM

## 2022-01-08 DIAGNOSIS — M6281 Muscle weakness (generalized): Secondary | ICD-10-CM

## 2022-01-08 DIAGNOSIS — M25551 Pain in right hip: Secondary | ICD-10-CM | POA: Diagnosis not present

## 2022-01-08 DIAGNOSIS — M5416 Radiculopathy, lumbar region: Secondary | ICD-10-CM | POA: Diagnosis not present

## 2022-01-08 DIAGNOSIS — R2689 Other abnormalities of gait and mobility: Secondary | ICD-10-CM | POA: Diagnosis not present

## 2022-01-08 DIAGNOSIS — G8929 Other chronic pain: Secondary | ICD-10-CM | POA: Diagnosis not present

## 2022-01-08 DIAGNOSIS — I10 Essential (primary) hypertension: Secondary | ICD-10-CM | POA: Diagnosis not present

## 2022-01-08 NOTE — Therapy (Signed)
[facility] ?[address] ?[place], [place], [address] ?Phone: [phone]   Fax:  [phone] ? ?Physical Therapy Treatment ? ?Patient Details  ?Name: Bethany Cole ?MRN: [id number] ?Date of Birth: Dec 19, 1950 ?Referring Provider (PT): [name], MD ? ? ?Encounter Date: 01/08/2022 ? ? PT End of Session - 01/08/22 1014   ? ? Visit Number 9   ? Number of Visits 14   ? Date for PT Re-Evaluation 02/09/22   ? Authorization Type [employer] Medicare   ? Progress Note Due on Visit 17   ? PT Start Time 0930   ? PT Stop Time 1012   ? PT Time Calculation (min) 42 min   ? Activity Tolerance Patient tolerated treatment well   ? Behavior During Therapy [facility] for tasks assessed/performed   ? ?  ?  ? ?  ? ? ?Past Medical History:  ?Diagnosis Date  ? Cataracts, bilateral   ? Gastritis   ? GERD (gastroesophageal reflux disease)   ? Hypertension   ? Hyperthyroidism   ? OSA on CPAP   ? Squamous cell carcinoma of skin   ? ? ?Past Surgical History:  ?Procedure Laterality Date  ? ABDOMINAL HYSTERECTOMY    ? ENDOMETRIAL ABLATION    ? HERNIA REPAIR    ? as an infant  ? UPPER GASTROINTESTINAL ENDOSCOPY  11/09/2021  ? Has had sevearal in [place]  ? WRIST ARTHROSCOPY Right   ? ? ?There were no vitals filed for this visit. ? ? Subjective Assessment - 01/08/22 0930   ? ? Subjective "Forgot the posture and body mechanics handout." Pt reports that pain gets better throughout the day.   ? Pertinent History HTN, Hyperthyroidism, Osteoporosis, B cataracts, OSA, GERD, Vit B12 deficiency, chronic LBP, B hip and B shoulder pain, leg length discrepancy; History of 2 falls with fracture of right wrist 2020, right ankle 2018 - Had PT after previous fractures   ? Diagnostic tests MRI R hip - 05/03/21: 1. No acute findings or clear explanation for the patient's symptoms. Minimal degenerative changes of the hips.  2. Mild asymmetric right gluteus tendinosis without tear.  3. Lower lumbar facet  arthropathy with asymmetric involvement on the left at L4-5, potentially contributing to low back pain.   Lumbar x-ray - 10/23/21: Mild curvature lumbar spine, apex to the left. Grade 1 anterolisthesis of L4 versus L5. No other malalignment. No  fractures. Minimal degenerative disc disease. More significant lumbar facet degenerative changes. Calcified atherosclerosis in the abdominal aorta.   ? Patient Stated Goals "more flexibility and strength with no pain"   ? Currently in Pain? Yes   ? Pain Score 4    ? Pain Location Buttocks   ? Pain Orientation Right   ? Pain Descriptors / Indicators Aching;Tightness   ? Pain Type Chronic pain   ? ?  ?  ? ?  ? ? ? ? ? ? ? ? ? ? ? ? ? ? ? ? ? ? ? ? [place] [facility] Adult PT Treatment/Exercise - 01/08/22 0001   ? ?  ? Lumbar Exercises: Stretches  ? Piriformis Stretch Right;3 reps;30 seconds   ? Piriformis Stretch Limitations seated   ? Other Lumbar Stretch Exercise lumbar flexion stretch with green pball, 3x30 sec   ?  ? Lumbar Exercises: Standing  ? Heel Raises 3 seconds;15 reps   ? Functional Squats 10 reps;3 seconds   ? Functional Squats Limitations squat to chair touch   cues  for post WS to reduce knees over toes  ? Shoulder Extension Both;Strengthening;Theraband;15 reps   ? Theraband Level (Shoulder Extension) Level 2 (Red)   ? Other Standing Lumbar Exercises red TB multifidus walkout x 10   ?  ? Knee/Hip Exercises: Aerobic  ? Recumbent Bike L3 x 6 min   ?  ? Knee/Hip Exercises: Seated  ? Other Seated Knee/Hip Exercises seated SLR x 10 bil   ?  ? Manual Therapy  ? Manual Therapy Soft tissue mobilization;Myofascial release   ? Soft tissue mobilization STM to R glute med, proximal HS, and lumbar paraspinals   ? Myofascial Release TPR to R glute med   ? ?  ?  ? ?  ? ? ? ? ? ? ? ? ? ? ? ? PT Short Term Goals - 12/16/21 1156   ? ?  ? PT SHORT TERM GOAL #1  ? Title Patient will be independent with initial HEP   ? Status Achieved   12/16/21  ? ?  ?  ? ?  ? ? ? ? PT Long Term Goals - 12/29/21  1030   ? ?  ? PT LONG TERM GOAL #1  ? Title Patient will be independent with ongoing/advanced HEP for self-management at home   ? Status Partially Met   ? Target Date 02/09/22   ?  ? PT LONG TERM GOAL #2  ? Title Patient will verbalize/demonstrate good awareness of neutral spine posture and proper body mechanics for daily tasks   ? Status Achieved   12/29/21  ?  ? PT LONG TERM GOAL #3  ? Title Patient to report reduction in frequency and intensity of LBP and B hip pain by >/= 50-75% to allow for improved activity tolerance   ? Status Partially Met   12/29/21 - Pt reports 20% reduction in R hip and LBP but L hip pain resolved and R LE pain nearly gone  ? Target Date 02/09/22   ?  ? PT LONG TERM GOAL #4  ? Title Patient to demonstrate improved tissue quality and pliability as noted by improved hip and low back flexibility and ROM   ? Status Partially Met   ? Target Date 02/09/22   ?  ? PT LONG TERM GOAL #5  ? Title Patient will demonstrate improved B LE strength to >/= 4/5 for improved stability and ease of mobility   ? Status Partially Met   ? Target Date 02/09/22   ?  ? PT LONG TERM GOAL #6  ? Title Patient to report ability to perform ADLs, household, and leisure activities without limitation due to LBP or B hip pain, LOM or weakness   ? Status Partially Met   12/29/21 - improving tolerance for walking the dogs (able to walk ~30 minutes now but will note increasing pain after 15-20 minutes)  ? Target Date 02/09/22   ? ?  ?  ? ?  ? ? ? ? ? ? ? ? Plan - 01/08/22 1015   ? ? Clinical Impression Statement Pt had reports of R gluteal hip tightness and lumbar pain that remains. Did STM to the R glute med and lumbar paraspinals, pt did report that she felt much better after MT. She had mild R hip pain after the squats, we did progress the depth of the squats but we avoided too much WB exercise after this. Cues were needed with squats for posterior WS to reduce knees over toes. Otherwise pt responded well.   ?  Personal Factors  and Comorbidities Age;Comorbidity 3+;Fitness;Past/Current Experience;Time since onset of injury/illness/exacerbation   ? Comorbidities HTN, Hyperthyroidism, Osteoporosis, B cataracts, OSA, GERD, Vit B12 deficiency, chronic LBP, B hip and B shoulder pain, leg length discrepancy; History of 2 falls with fracture of right wrist 2020, right ankle 2018 - Had PT after previous fractures   ? PT Frequency 1x / week   ? PT Duration 6 weeks   ? PT Treatment/Interventions ADLs/Self Care Home Management;Cryotherapy;Electrical Stimulation;Iontophoresis 59m/ml Dexamethasone;Moist Heat;Traction;Ultrasound;Gait training;Stair training;Functional mobility training;Therapeutic activities;Therapeutic exercise;Balance training;Neuromuscular re-education;Patient/family education;Manual techniques;Passive range of motion;Dry needling;Taping;Spinal Manipulations;Joint Manipulations   ? PT Next Visit Plan progress core and proximal LE flexibilty and lumbopelvic strengthening (from upright positions as appropriate per pt preference); MT and modalities PRN for pain management   ? PT Home Exercise Plan Access Code: [email or web address](1/31, updated 2/17 & 2/22)   ? Consulted and Agree with Plan of Care Patient   ? ?  ?  ? ?  ? ? ?Patient will benefit from skilled therapeutic intervention in order to improve the following deficits and impairments:  Abnormal gait, Decreased activity tolerance, Decreased mobility, Decreased range of motion, Decreased strength, Difficulty walking, Increased fascial restricitons, Increased muscle spasms, Impaired perceived functional ability, Impaired flexibility, Improper body mechanics, Postural dysfunction, Decreased balance, Pain ? ?Visit Diagnosis: ?Chronic midline low back pain without sciatica ? ?Pain in right hip ? ?Muscle weakness (generalized) ? ?Other abnormalities of gait and mobility ? ?Radiculopathy, lumbar region ? ? ? ? ?Problem List ?Patient Active Problem List  ? Diagnosis Date Noted  ? Chronic pain of  both feet 11/13/2021  ? Leg length discrepancy 11/13/2021  ? Vitamin B12 deficiency 09/02/2021  ? OSA on CPAP   ? [place]   ? Fall 11/17/2020  ? Hyperlipemia 11/07/2020  ? Osteoporosis

## 2022-01-13 ENCOUNTER — Ambulatory Visit: Payer: Medicare Other

## 2022-01-13 ENCOUNTER — Other Ambulatory Visit: Payer: Self-pay

## 2022-01-13 DIAGNOSIS — M5416 Radiculopathy, lumbar region: Secondary | ICD-10-CM

## 2022-01-13 DIAGNOSIS — R2689 Other abnormalities of gait and mobility: Secondary | ICD-10-CM | POA: Diagnosis not present

## 2022-01-13 DIAGNOSIS — M25551 Pain in right hip: Secondary | ICD-10-CM | POA: Diagnosis not present

## 2022-01-13 DIAGNOSIS — G8929 Other chronic pain: Secondary | ICD-10-CM

## 2022-01-13 DIAGNOSIS — M545 Low back pain, unspecified: Secondary | ICD-10-CM | POA: Diagnosis not present

## 2022-01-13 DIAGNOSIS — M6281 Muscle weakness (generalized): Secondary | ICD-10-CM | POA: Diagnosis not present

## 2022-01-13 NOTE — Therapy (Signed)
[place] ?[facility] ?[address] ?[place], [place], [address] ?Phone: [phone]   Fax:  [phone] ? ?Physical Therapy Treatment ? ?Patient Details  ?Name: Bethany Cole ?MRN: [id number] ?Date of Birth: 05-11-1951 ?Referring Provider (PT): [name], MD ? ? ?Encounter Date: 01/13/2022 ? ? PT End of Session - 01/13/22 0846   ? ? Visit Number 10   ? Number of Visits 14   ? Date for PT Re-Evaluation 02/09/22   ? Authorization Type U[employer] Medicare   ? Progress Note Due on Visit 17   ? PT Start Time 0800   ? PT Stop Time [id number]   ? PT Time Calculation (min) 42 min   ? Activity Tolerance Patient tolerated treatment well   ? Behavior During Therapy [facility] for tasks assessed/performed   ? ?  ?  ? ?  ? ? ?Past Medical History:  ?Diagnosis Date  ? Cataracts, bilateral   ? Gastritis   ? GERD (gastroesophageal reflux disease)   ? Hypertension   ? Hyperthyroidism   ? OSA on CPAP   ? Squamous cell carcinoma of skin   ? ? ?Past Surgical History:  ?Procedure Laterality Date  ? ABDOMINAL HYSTERECTOMY    ? ENDOMETRIAL ABLATION    ? HERNIA REPAIR    ? as an infant  ? UPPER GASTROINTESTINAL ENDOSCOPY  11/09/2021  ? Has had sevearal in [place]  ? WRIST ARTHROSCOPY Right   ? ? ?There were no vitals filed for this visit. ? ? Subjective Assessment - 01/13/22 0802   ? ? Subjective Pt reports doing more lifting over the weekend and moving, the day after I started hurting.   ? Pertinent History HTN, Hyperthyroidism, Osteoporosis, B cataracts, OSA, GERD, Vit B12 deficiency, chronic LBP, B hip and B shoulder pain, leg length discrepancy; History of 2 falls with fracture of right wrist 2020, right ankle 2018 - Had PT after previous fractures   ? Diagnostic tests MRI R hip - 05/03/21: 1. No acute findings or clear explanation for the patient's symptoms. Minimal degenerative changes of the hips.  2. Mild asymmetric right gluteus tendinosis without tear.  3. Lower lumbar facet arthropathy with  asymmetric involvement on the left at L4-5, potentially contributing to low back pain.   Lumbar x-ray - 10/23/21: Mild curvature lumbar spine, apex to the left. Grade 1 anterolisthesis of L4 versus L5. No other malalignment. No  fractures. Minimal degenerative disc disease. More significant lumbar facet degenerative changes. Calcified atherosclerosis in the abdominal aorta.   ? Patient Stated Goals "more flexibility and strength with no pain"   ? Currently in Pain? Yes   ? Pain Score 5    ? Pain Location Buttocks   lower back into buttocks  ? Pain Orientation Right   ? Pain Descriptors / Indicators Aching;Tightness   ? Pain Type Chronic pain   ? ?  ?  ? ?  ? ? ? ? ? ? ? ? ? ? ? ? ? ? ? ? ? ? ? ? [place] Adult PT Treatment/Exercise - 01/13/22 0001   ? ?  ? Exercises  ? Exercises Knee/Hip;Lumbar   ?  ? Lumbar Exercises: Stretches  ? Piriformis Stretch Right;2 reps;30 seconds   ? Piriformis Stretch Limitations seated figure 4   ? Other Lumbar Stretch Exercise lumbar flexion stretch and L rotation with green pball, x30 sec each   ?  ? Lumbar Exercises: Standing  ? Other Standing Lumbar Exercises golfers  lifts with 1 hand support, R/L x 10   ?  ? Lumbar Exercises: Seated  ? Other Seated Lumbar Exercises seated deadlift with yellow weight ball x 10   ?  ? Lumbar Exercises: Supine  ? Bridge 10 reps;3 seconds   2 sets  ? Bridge Limitations + RTB hip ABD isometric   ?  ? Lumbar Exercises: Sidelying  ? Clam Both;10 reps;3 seconds   2 sets  ? Clam Limitations with RTB   ?  ? Knee/Hip Exercises: Aerobic  ? Nustep L5 x 6 min (LE only)   ? ?  ?  ? ?  ? ? ? ? ? ? ? ? ? ? PT Education - 01/13/22 0846   ? ? Education Details brief review of posture and body mechanics handout, GTB administred for HEP   ? Person(s) Educated Patient   ? Methods Explanation   ? Comprehension Verbalized understanding   ? ?  ?  ? ?  ? ? ? PT Short Term Goals - 12/16/21 1156   ? ?  ? PT SHORT TERM GOAL #1  ? Title Patient will be independent with initial HEP    ? Status Achieved   12/16/21  ? ?  ?  ? ?  ? ? ? ? PT Long Term Goals - 12/29/21 1030   ? ?  ? PT LONG TERM GOAL #1  ? Title Patient will be independent with ongoing/advanced HEP for self-management at home   ? Status Partially Met   ? Target Date 02/09/22   ?  ? PT LONG TERM GOAL #2  ? Title Patient will verbalize/demonstrate good awareness of neutral spine posture and proper body mechanics for daily tasks   ? Status Achieved   12/29/21  ?  ? PT LONG TERM GOAL #3  ? Title Patient to report reduction in frequency and intensity of LBP and B hip pain by >/= 50-75% to allow for improved activity tolerance   ? Status Partially Met   12/29/21 - Pt reports 20% reduction in R hip and LBP but L hip pain resolved and R LE pain nearly gone  ? Target Date 02/09/22   ?  ? PT LONG TERM GOAL #4  ? Title Patient to demonstrate improved tissue quality and pliability as noted by improved hip and low back flexibility and ROM   ? Status Partially Met   ? Target Date 02/09/22   ?  ? PT LONG TERM GOAL #5  ? Title Patient will demonstrate improved B LE strength to >/= 4/5 for improved stability and ease of mobility   ? Status Partially Met   ? Target Date 02/09/22   ?  ? PT LONG TERM GOAL #6  ? Title Patient to report ability to perform ADLs, household, and leisure activities without limitation due to LBP or B hip pain, LOM or weakness   ? Status Partially Met   12/29/21 - improving tolerance for walking the dogs (able to walk ~30 minutes now but will note increasing pain after 15-20 minutes)  ? Target Date 02/09/22   ? ?  ?  ? ?  ? ? ? ? ? ? ? ? Plan - 01/13/22 0847   ? ? Clinical Impression Statement Pt reported an increase in pain over the past 2 days but noted that she assisted with moving around furniture in her house on Sunday, thinking this could be the cause. Practiced lifting in sitting and standing, ensuring proper body mechanics. Pt  was mildly sore from the golfers lifts. Had an occurence of HS cramping but subsided with stretching.    ? Personal Factors and Comorbidities Age;Comorbidity 3+;Fitness;Past/Current Experience;Time since onset of injury/illness/exacerbation   ? Comorbidities HTN, Hyperthyroidism, Osteoporosis, B cataracts, OSA, GERD, Vit B12 deficiency, chronic LBP, B hip and B shoulder pain, leg length discrepancy; History of 2 falls with fracture of right wrist 2020, right ankle 2018 - Had PT after previous fractures   ? PT Frequency 1x / week   ? PT Duration 6 weeks   ? PT Treatment/Interventions ADLs/Self Care Home Management;Cryotherapy;Electrical Stimulation;Iontophoresis 45m/ml Dexamethasone;Moist Heat;Traction;Ultrasound;Gait training;Stair training;Functional mobility training;Therapeutic activities;Therapeutic exercise;Balance training;Neuromuscular re-education;Patient/family education;Manual techniques;Passive range of motion;Dry needling;Taping;Spinal Manipulations;Joint Manipulations   ? PT Next Visit Plan progress core and proximal LE flexibilty and lumbopelvic strengthening (from upright positions as appropriate per pt preference); MT and modalities PRN for pain management   ? PT Home Exercise Plan Access Code: [email or web address](1/31, updated 2/17 & 2/22)   ? Consulted and Agree with Plan of Care Patient   ? ?  ?  ? ?  ? ? ?Patient will benefit from skilled therapeutic intervention in order to improve the following deficits and impairments:  Abnormal gait, Decreased activity tolerance, Decreased mobility, Decreased range of motion, Decreased strength, Difficulty walking, Increased fascial restricitons, Increased muscle spasms, Impaired perceived functional ability, Impaired flexibility, Improper body mechanics, Postural dysfunction, Decreased balance, Pain ? ?Visit Diagnosis: ?Chronic midline low back pain without sciatica ? ?Pain in right hip ? ?Muscle weakness (generalized) ? ?Other abnormalities of gait and mobility ? ?Radiculopathy, lumbar region ? ? ? ? ?Problem List ?Patient Active Problem List  ? Diagnosis Date Noted   ? Chronic pain of both feet 11/13/2021  ? Leg length discrepancy 11/13/2021  ? Vitamin B12 deficiency 09/02/2021  ? OSA on CPAP   ? Obesity hypoventilation syndrome ([place]   ? Fall 11/17/2020  ? Hyperl

## 2022-01-16 ENCOUNTER — Encounter: Payer: Self-pay | Admitting: Internal Medicine

## 2022-01-19 ENCOUNTER — Ambulatory Visit: Payer: Medicare Other | Admitting: Physical Therapy

## 2022-01-19 ENCOUNTER — Other Ambulatory Visit: Payer: Self-pay

## 2022-01-19 ENCOUNTER — Encounter: Payer: Self-pay | Admitting: Physical Therapy

## 2022-01-19 DIAGNOSIS — M25551 Pain in right hip: Secondary | ICD-10-CM | POA: Diagnosis not present

## 2022-01-19 DIAGNOSIS — G8929 Other chronic pain: Secondary | ICD-10-CM | POA: Diagnosis not present

## 2022-01-19 DIAGNOSIS — M5416 Radiculopathy, lumbar region: Secondary | ICD-10-CM | POA: Diagnosis not present

## 2022-01-19 DIAGNOSIS — M6281 Muscle weakness (generalized): Secondary | ICD-10-CM

## 2022-01-19 DIAGNOSIS — R2689 Other abnormalities of gait and mobility: Secondary | ICD-10-CM | POA: Diagnosis not present

## 2022-01-19 DIAGNOSIS — M545 Low back pain, unspecified: Secondary | ICD-10-CM

## 2022-01-19 NOTE — Therapy (Signed)
[place] ?[facility] ?[address] ?[place], [place], [address] ?Phone: [phone]   Fax:  [phone] ? ?Physical Therapy Treatment ? ?Patient Details  ?Name: Bethany Cole ?MRN: [id number] ?Date of Birth: [birth year] ?Referring Provider (PT): [name], MD ? ? ?Encounter Date: 01/19/2022 ? ? PT End of Session - 01/19/22 1315   ? ? Visit Number 11   ? Number of Visits 14   ? Date for PT Re-Evaluation 02/09/22   ? Authorization Type UHC Medicare   ? Progress Note Due on Visit 17   ? PT Start Time 1315   ? PT Stop Time 1400   ? PT Time Calculation (min) 45 min   ? Activity Tolerance Patient tolerated treatment well   ? Behavior During Therapy [facility] for tasks assessed/performed   ? ?  ?  ? ?  ? ? ?Past Medical History:  ?Diagnosis Date  ? Cataracts, bilateral   ? Gastritis   ? GERD (gastroesophageal reflux disease)   ? Hypertension   ? Hyperthyroidism   ? OSA on CPAP   ? Squamous cell carcinoma of skin   ? ? ?Past Surgical History:  ?Procedure Laterality Date  ? ABDOMINAL HYSTERECTOMY    ? ENDOMETRIAL ABLATION    ? HERNIA REPAIR    ? as an infant  ? UPPER GASTROINTESTINAL ENDOSCOPY  11/09/2021  ? Has had sevearal in [place]  ? WRIST ARTHROSCOPY Right   ? ? ?There were no vitals filed for this visit. ? ? Subjective Assessment - 01/19/22 1319   ? ? Subjective Pt reports a couple days where her hip has hurt - not bad - just hurts.   ? Pertinent History HTN, Hyperthyroidism, Osteoporosis, B cataracts, OSA, GERD, Vit B12 deficiency, chronic LBP, B hip and B shoulder pain, leg length discrepancy; History of 2 falls with fracture of right wrist 2020, right ankle 2018 - Had PT after previous fractures   ? Diagnostic tests MRI R hip - 05/03/21: 1. No acute findings or clear explanation for the patient's symptoms. Minimal degenerative changes of the hips.  2. Mild asymmetric right gluteus tendinosis without tear.  3. Lower lumbar facet arthropathy with asymmetric involvement  on the left at L4-5, potentially contributing to low back pain.   Lumbar x-ray - 10/23/21: Mild curvature lumbar spine, apex to the left. Grade 1 anterolisthesis of L4 versus L5. No other malalignment. No  fractures. Minimal degenerative disc disease. More significant lumbar facet degenerative changes. Calcified atherosclerosis in the abdominal aorta.   ? Patient Stated Goals "more flexibility and strength with no pain"   ? Currently in Pain? Yes   ? Pain Score 4    ? Pain Location Buttocks   ? Pain Orientation Right;Upper;Lateral   upper buttocks to lateral hip  ? Pain Descriptors / Indicators Constant;Aching   "stiff"  ? Pain Type Chronic pain   ? Effect of Pain on Daily Activities limits full stride when walking   ? ?  ?  ? ?  ? ? ? ? ? ? ? ? ? ? ? ? ? ? ? ? ? ? ? ? [facility] Adult PT Treatment/Exercise - 01/19/22 1315   ? ?  ? Knee/Hip Exercises: Aerobic  ? Recumbent Bike L3 x 6 min   ?  ? Manual Therapy  ? Manual Therapy Soft tissue mobilization;Myofascial release;Taping   ? Manual therapy comments skilled palpation and monitoring of soft tissue during DN   ? Soft tissue  mobilization STM/DTM to R glute med/min and piriformis   ? Myofascial Release TPR to R glute med/min and piriformis   ? Kinesiotex Inhibit Muscle   ?  ? Kinesiotix  ? Inhibit Muscle  R piriformis - 30-50% along muscle belly + perpendicular strip at area of greatest tenderness   ? ?  ?  ? ?  ? ? ? Tr[place] Dry Needling - 01/19/22 1315   ? ? Consent Given? Yes   ? Education Handout Provided Yes   ? Muscles Treated Back/Hip Gluteus minimus;Gluteus medius;Piriformis   ? Dry Needling Comments Right   ? Gluteus Minimus Response Twitch response elicited;Palpable increased muscle length   ? Gluteus Medius Response Twitch response elicited;Palpable increased muscle length   ? Piriformis Response Twitch response elicited;Palpable increased muscle length   ? ?  ?  ? ?  ? ? ? ? ? ? ? ? PT Education - 01/19/22 1400   ? ? Education Details DN rational,  procedure, outcomes, potential side effects, and recommended post-treatment exercises/activity; Ktape wearing and removal instructions   ? Person(s) Educated Patient   ? Methods Explanation;Handout   ? Comprehension Verbalized understanding   ? ?  ?  ? ?  ? ? ? PT Short Term Goals - 12/16/21 1156   ? ?  ? PT SHORT TERM GOAL #1  ? Title Patient will be independent with initial HEP   ? Status Achieved   12/16/21  ? ?  ?  ? ?  ? ? ? ? PT Long Term Goals - 01/19/22 1322   ? ?  ? PT LONG TERM GOAL #1  ? Title Patient will be independent with ongoing/advanced HEP for self-management at home   ? Status Partially Met   ? Target Date 02/09/22   ?  ? PT LONG TERM GOAL #2  ? Title Patient will verbalize/demonstrate good awareness of neutral spine posture and proper body mechanics for daily tasks   ? Status Achieved   12/29/21  ?  ? PT LONG TERM GOAL #3  ? Title Patient to report reduction in frequency and intensity of LBP and B hip pain by >/= 50-75% to allow for improved activity tolerance   ? Status Achieved   01/19/22 - Pt reports 60-65% reduction in R hip and LBP with no further pain into R LE, L hip pain remains resolved, walking tolerance for walking her dogs is improved  ? Target Date --   ?  ? PT LONG TERM GOAL #4  ? Title Patient to demonstrate improved tissue quality and pliability as noted by improved hip and low back flexibility and ROM   ? Status Partially Met   ? Target Date 02/09/22   ?  ? PT LONG TERM GOAL #5  ? Title Patient will demonstrate improved B LE strength to >/= 4/5 for improved stability and ease of mobility   ? Status Partially Met   ? Target Date 02/09/22   ?  ? PT LONG TERM GOAL #6  ? Title Patient to report ability to perform ADLs, household, and leisure activities without limitation due to LBP or B hip pain, LOM or weakness   ? Status Partially Met   12/29/21 - improving tolerance for walking the dogs (able to walk ~30 minutes now but will note increasing pain after 15-20 minutes)  ? Target Date  02/09/22   ? ?  ?  ? ?  ? ? ? ? ? ? ? ? Plan - 01/19/22  1400   ? ? Clinical Impression Statement Aloni reports 60-65% improvement in overall pain with L hip pain and radiating pain in to R LE now resolved but expresses frustration with ongoing R buttock/hip pain. Significant increased muscle tension and TTP identified in her R glutes and piriformis which seemed slow to respond to STM/DTM and manual TPR, therefore suggest trial of DN. After explanation of DN rational, procedures, outcomes and potential side effects, patient verbalized consent to DN treatment in conjunction with manual STM/DTM and TPR to reduce ttp/muscle tension. Muscles treated include R glute medius and minimus and R piriformis. DN produced normal response with good twitches elicited resulting in palpable reduction in pain/ttp and muscle tension, although still some tenderness noted in piriformis. Trial of kinesiotape applied along piriformis muscle belly to promote further muscle relaxation. Pt educated to expect mild to moderate muscle soreness for up to 24-48 hrs and instructed to continue prescribed home exercise program and current activity level with pt verbalizing understanding of theses instructions. Instructions also provided for Ktape wearing and removal.   ? Comorbidities HTN, Hyperthyroidism, Osteoporosis, B cataracts, OSA, GERD, Vit B12 deficiency, chronic LBP, B hip and B shoulder pain, leg length discrepancy; History of 2 falls with fracture of right wrist 2020, right ankle 2018 - Had PT after previous fractures   ? Rehab Potential Good   ? PT Frequency 1x / week   ? PT Duration 6 weeks   ? PT Treatment/Interventions ADLs/Self Care Home Management;Cryotherapy;Electrical Stimulation;Iontophoresis 58m/ml Dexamethasone;Moist Heat;Traction;Ultrasound;Gait training;Stair training;Functional mobility training;Therapeutic activities;Therapeutic exercise;Balance training;Neuromuscular re-education;Patient/family education;Manual  techniques;Passive range of motion;Dry needling;Taping;Spinal Manipulations;Joint Manipulations   ? PT Next Visit Plan assess response to DN and Ktape; progress core and proximal LE flexibilty and lumbopelvic strengtheni

## 2022-01-19 NOTE — Patient Instructions (Signed)
Trigger Point Dry Needling ? ?What is Trigger Point Dry Needling (DN)? ?DN is a physical therapy technique used to treat muscle pain and dysfunction. Specifically, DN helps deactivate muscle trigger points (muscle knots).  ?A thin filiform needle is used to penetrate the skin and stimulate the underlying trigger point. The goal is for a local twitch response (LTR) to occur and for the trigger point to relax. No medication of any kind is injected during the procedure.  ? ?What Does Trigger Point Dry Needling Feel Like?  ?The procedure feels different for each individual patient. Some patients report that they do not actually feel the needle enter the skin and overall the process is not painful. Very mild bleeding may occur. However, many patients feel a deep cramping in the muscle in which the needle was inserted. This is the local twitch response.  ? ?How Will I feel after the treatment? ?Soreness is normal, and the onset of soreness may not occur for a few hours. Typically this soreness does not last longer than two days.  ?Bruising is uncommon, however; ice can be used to decrease any possible bruising.  ?In rare cases feeling tired or nauseous after the treatment is normal. In addition, your symptoms may get worse before they get better, this period will typically not last longer than 24 hours.  ? ?What Can I do After My Treatment? ?Increase your hydration by drinking more water for the next 24 hours. ?You may place ice or heat on the areas treated that have become sore, however, do not use heat on inflamed or bruised areas. Heat often brings more relief post needling. ?You can continue your regular activities, but vigorous activity is not recommended initially after the treatment for 24 hours. ?DN is best combined with other physical therapy such as strengthening, stretching, and other therapies.  ? ? ? ? ?Kinesiology tape ? ?What is kinesiology tape? ? ?There are many brands of kinesiology tape. KTape, [employer], [employer], Dynamic tape, to name a few.  It is an elasticized tape designed to support the body's natural healing process. This tape provides stability and support to muscles and joints without restricting motion.  It can also help decrease swelling in the area of application. ? ?How does it work? ? ?The tape microscopically lifts and decompresses the skin to allow for drainage of lymph (swelling) to flow away from area, reducing inflammation. The tape has the ability to help re-educate the neuromuscular system by targeting specific receptors in the skin. The presence of the tape increases the body's awareness of posture and body mechanics. ? ?Do not use with: ? ?Open wounds ?Skin lesions ?Adhesive allergies ? ?In some rare cases, mild/moderate skin irritation can occur. This can include redness, itchiness, or hives. If this occurs, immediately remove tape and consult your primary care physician if symptoms are severe or do not resolve within 2 days. ? ?Safe removal of the tape: ? ?To remove tape safely, hold nearby skin with one hand and gentle roll tape down with other hand. You can apply oil or conditioner to tape while in shower prior to removal to loosen adhesive. ?DO NOT swiftly rip tape off like a band-aid, as this could cause skin tears and additional skin irritation.  ? ? ? ?For questions, please contact your therapist at: ? ?[place] ?[facility] ?[address] ?[place], [place], [address] ?Phone: [phone]   Fax:  [phone] ? ?

## 2022-01-26 ENCOUNTER — Encounter: Payer: Self-pay | Admitting: Physical Therapy

## 2022-01-26 ENCOUNTER — Ambulatory Visit: Payer: Medicare Other | Attending: Family Medicine | Admitting: Physical Therapy

## 2022-01-26 DIAGNOSIS — R2689 Other abnormalities of gait and mobility: Secondary | ICD-10-CM | POA: Insufficient documentation

## 2022-01-26 DIAGNOSIS — M25551 Pain in right hip: Secondary | ICD-10-CM | POA: Insufficient documentation

## 2022-01-26 DIAGNOSIS — G8929 Other chronic pain: Secondary | ICD-10-CM | POA: Diagnosis not present

## 2022-01-26 DIAGNOSIS — M6281 Muscle weakness (generalized): Secondary | ICD-10-CM | POA: Insufficient documentation

## 2022-01-26 DIAGNOSIS — M5416 Radiculopathy, lumbar region: Secondary | ICD-10-CM | POA: Insufficient documentation

## 2022-01-26 DIAGNOSIS — M545 Low back pain, unspecified: Secondary | ICD-10-CM | POA: Insufficient documentation

## 2022-01-26 NOTE — Therapy (Signed)
[facility] ?[place] ?[place], [place], [address] ?Phone: [phone]   Fax:  [phone] ? ?Physical Therapy Treatment ? ?Patient Details  ?Name: Bethany Cole ?MRN: [id number] ?Date of Birth: 11-Dec-1950 ?Referring Provider (PT): [name], MD ? ? ?Encounter Date: 01/26/2022 ? ? PT End of Session - 01/26/22 0932   ? ? Visit Number 12   ? Number of Visits 14   ? Date for PT Re-Evaluation 02/09/22   ? Authorization Type UHC Medicare   ? Progress Note Due on Visit 17   ? PT Start Time 4259   ? PT Stop Time 1014   ? PT Time Calculation (min) 42 min   ? Activity Tolerance Patient tolerated treatment well   ? Behavior During Therapy [facility] for tasks assessed/performed   ? ?  ?  ? ?  ? ? ?Past Medical History:  ?Diagnosis Date  ? Cataracts, bilateral   ? Gastritis   ? GERD (gastroesophageal reflux disease)   ? Hypertension   ? Hyperthyroidism   ? OSA on CPAP   ? Squamous cell carcinoma of skin   ? ? ?Past Surgical History:  ?Procedure Laterality Date  ? ABDOMINAL HYSTERECTOMY    ? ENDOMETRIAL ABLATION    ? HERNIA REPAIR    ? as an infant  ? UPPER GASTROINTESTINAL ENDOSCOPY  11/09/2021  ? Has had sevearal in [place]  ? WRIST ARTHROSCOPY Right   ? ? ?There were no vitals filed for this visit. ? ? Subjective Assessment - 01/26/22 0934   ? ? Subjective Pt reports the DN made a big difference - notes less pain with tasks such as putting her shoes on. Feels like walking is now helping rather than hindering her.   ? Pertinent History HTN, Hyperthyroidism, Osteoporosis, B cataracts, OSA, GERD, Vit B12 deficiency, chronic LBP, B hip and B shoulder pain, leg length discrepancy; History of 2 falls with fracture of right wrist 2020, right ankle 2018 - Had PT after previous fractures   ? Diagnostic tests MRI R hip - 05/03/21: 1. No acute findings or clear explanation for the patient's symptoms. Minimal degenerative changes of the hips.  2. Mild asymmetric right gluteus  tendinosis without tear.  3. Lower lumbar facet arthropathy with asymmetric involvement on the left at L4-5, potentially contributing to low back pain.   Lumbar x-ray - 10/23/21: Mild curvature lumbar spine, apex to the left. Grade 1 anterolisthesis of L4 versus L5. No other malalignment. No  fractures. Minimal degenerative disc disease. More significant lumbar facet degenerative changes. Calcified atherosclerosis in the abdominal aorta.   ? Patient Stated Goals "more flexibility and strength with no pain"   ? Currently in Pain? Yes   ? Pain Score 4    ? Pain Location Buttocks   "& moving a little toward the back"  ? Pain Orientation Right;Upper;Medial   ? Pain Descriptors / Indicators Aching   ? Pain Type Chronic pain   ? Pain Frequency Intermittent   ? ?  ?  ? ?  ? ? ? ? ? ? ? ? ? ? ? ? ? ? ? ? ? ? ? ? [facility] PT Treatment/Exercise - 01/26/22 0932   ? ?  ? Lumbar Exercises: Stretches  ? Standing Side Bend Right;Left;2 reps;30 seconds   ? Standing Side Bend Limitations QL/ITB stretch   ? Piriformis Stretch Right;2 reps;30 seconds   ? Piriformis Stretch Limitations seated figure 4   ?  ?  Knee/Hip Exercises: Aerobic  ? Nustep L5 x 6 min (UE/LE)   ?  ? Manual Therapy  ? Manual Therapy Soft tissue mobilization;Myofascial release   ? Manual therapy comments skilled palpation and monitoring of soft tissue during DN   ? Soft tissue mobilization STM/DTM to R glute med/min, piriformis, HS, QL and lumbar paraspinals   ? Myofascial Release TPR to R glute medius and piriformis; pin & stretch to R lumbar paraspinals & QL   ? ?  ?  ? ?  ? ? ? Trigger Point Dry Needling - 01/26/22 0932   ? ? Consent Given? Yes   ? Education Handout Provided Previously provided   ? Muscles Treated Lower Quadrant Hamstring   ? Muscles Treated Back/Hip Gluteus medius;Piriformis;Erector spinae;Quadratus lumborum   ? Dry Needling Comments Right   ? Hamstring Response Twitch response elicited;Palpable increased muscle length   ? Gluteus Medius  Response Twitch response elicited;Palpable increased muscle length   ? Piriformis Response Twitch response elicited;Palpable increased muscle length   ? Erector spinae Response Twitch response elicited;Palpable increased muscle length   ? Quadratus Lumborum Response Twitch response elicited;Palpable increased muscle length   ? ?  ?  ? ?  ? ? ? ? ? ? ? ? ? ? PT Short Term Goals - 12/16/21 1156   ? ?  ? PT SHORT TERM GOAL #1  ? Title Patient will be independent with initial HEP   ? Status Achieved   12/16/21  ? ?  ?  ? ?  ? ? ? ? PT Long Term Goals - 01/19/22 1322   ? ?  ? PT LONG TERM GOAL #1  ? Title Patient will be independent with ongoing/advanced HEP for self-management at home   ? Status Partially Met   ? Target Date 02/09/22   ?  ? PT LONG TERM GOAL #2  ? Title Patient will verbalize/demonstrate good awareness of neutral spine posture and proper body mechanics for daily tasks   ? Status Achieved   12/29/21  ?  ? PT LONG TERM GOAL #3  ? Title Patient to report reduction in frequency and intensity of LBP and B hip pain by >/= 50-75% to allow for improved activity tolerance   ? Status Achieved   01/19/22 - Pt reports 60-65% reduction in R hip and LBP with no further pain into R LE, L hip pain remains resolved, walking tolerance for walking her dogs is improved  ? Target Date --   ?  ? PT LONG TERM GOAL #4  ? Title Patient to demonstrate improved tissue quality and pliability as noted by improved hip and low back flexibility and ROM   ? Status Partially Met   ? Target Date 02/09/22   ?  ? PT LONG TERM GOAL #5  ? Title Patient will demonstrate improved B LE strength to >/= 4/5 for improved stability and ease of mobility   ? Status Partially Met   ? Target Date 02/09/22   ?  ? PT LONG TERM GOAL #6  ? Title Patient to report ability to perform ADLs, household, and leisure activities without limitation due to LBP or B hip pain, LOM or weakness   ? Status Partially Met   12/29/21 - improving tolerance for walking the dogs  (able to walk ~30 minutes now but will note increasing pain after 15-20 minutes)  ? Target Date 02/09/22   ? ?  ?  ? ?  ? ? ? ? ? ? ? ?  Plan - 01/26/22 0941   ? ? Clinical Impression Statement Moncerrat reports positive benefit from DN resulting in functional improvement with daily tasks such as putting on her shoes or reaching down to pick something up as well as improved efficacy and less pain with standing ITB/QL stretch walking for exercise. She notes continued pain/TTP in R upper medial glutes and R low back today as well as in distal R piriformis and proximal HS and requested DN to address these areas if indicated. DN performed in conjunction with manual STM/DTM and MFR to above muscles with good twitch responses elicited resulting in palpable reduction in muscle tension and TTP. We are nearing the end of her current POC and will hopefully be ready to transition to her HEP following final HEP review/update and goal assessment.   ? Comorbidities HTN, Hyperthyroidism, Osteoporosis, B cataracts, OSA, GERD, Vit B12 deficiency, chronic LBP, B hip and B shoulder pain, leg length discrepancy; History of 2 falls with fracture of right wrist 2020, right ankle 2018 - Had PT after previous fractures   ? Rehab Potential Good   ? PT Frequency 1x / week   ? PT Duration 6 weeks   ? PT Treatment/Interventions ADLs/Self Care Home Management;Cryotherapy;Electrical Stimulation;Iontophoresis 4mg /ml Dexamethasone;Moist Heat;Traction;Ultrasound;Gait training;Stair training;Functional mobility training;Therapeutic activities;Therapeutic exercise;Balance training;Neuromuscular re-education;Patient/family education;Manual techniques;Passive range of motion;Dry needling;Taping;Spinal Manipulations;Joint Manipulations   ? PT Next Visit Plan HEP review/update in prep for transition to HEP at end of POC; progress core and proximal LE flexibilty and lumbopelvic strengthening (from upright positions as appropriate per pt preference); MT +/-  DN, Ktape and modalities PRN for pain management   ? PT Home Exercise Plan Access Code: [email or web address] (1/31, updated 2/17 & 2/22)   ? Consulted and Agree with Plan of Care Patient   ? ?  ?  ? ?  ? ? ?Patient will be

## 2022-02-01 ENCOUNTER — Ambulatory Visit: Payer: Medicare Other | Admitting: Physical Therapy

## 2022-02-01 ENCOUNTER — Ambulatory Visit (INDEPENDENT_AMBULATORY_CARE_PROVIDER_SITE_OTHER): Payer: Medicare Other | Admitting: *Deleted

## 2022-02-01 DIAGNOSIS — E538 Deficiency of other specified B group vitamins: Secondary | ICD-10-CM | POA: Diagnosis not present

## 2022-02-01 MED ORDER — CYANOCOBALAMIN 1000 MCG/ML IJ SOLN
1000.0000 ug | Freq: Once | INTRAMUSCULAR | Status: AC
  Administered 2022-02-01: 1000 ug via INTRAMUSCULAR

## 2022-02-01 NOTE — Progress Notes (Signed)
Pt here for monthly B12 injection per   B12 1000mcg given IM, and pt tolerated injection well.  Next B12 injection scheduled for x 1 month 

## 2022-02-02 DIAGNOSIS — G4733 Obstructive sleep apnea (adult) (pediatric): Secondary | ICD-10-CM | POA: Diagnosis not present

## 2022-02-02 DIAGNOSIS — I10 Essential (primary) hypertension: Secondary | ICD-10-CM | POA: Diagnosis not present

## 2022-02-08 DIAGNOSIS — G4733 Obstructive sleep apnea (adult) (pediatric): Secondary | ICD-10-CM | POA: Diagnosis not present

## 2022-02-08 DIAGNOSIS — I10 Essential (primary) hypertension: Secondary | ICD-10-CM | POA: Diagnosis not present

## 2022-02-09 ENCOUNTER — Ambulatory Visit: Payer: Medicare Other

## 2022-02-09 DIAGNOSIS — M6281 Muscle weakness (generalized): Secondary | ICD-10-CM

## 2022-02-09 DIAGNOSIS — M25551 Pain in right hip: Secondary | ICD-10-CM | POA: Diagnosis not present

## 2022-02-09 DIAGNOSIS — M545 Low back pain, unspecified: Secondary | ICD-10-CM | POA: Diagnosis not present

## 2022-02-09 DIAGNOSIS — M5416 Radiculopathy, lumbar region: Secondary | ICD-10-CM | POA: Diagnosis not present

## 2022-02-09 DIAGNOSIS — G8929 Other chronic pain: Secondary | ICD-10-CM | POA: Diagnosis not present

## 2022-02-09 DIAGNOSIS — R2689 Other abnormalities of gait and mobility: Secondary | ICD-10-CM

## 2022-02-09 NOTE — Therapy (Addendum)
[facility] [address]  [place], [place], [address] Phone: [phone]   Fax:  [phone]  Physical Therapy Treatment / Discharge Summary  Patient Details  Name: Bethany Cole MRN: [id number] Date of Birth: September 22, 1951 Referring Provider (PT): [name], MD   Encounter Date: 02/09/2022   PT End of Session - 02/09/22 1108     Visit Number 13    Number of Visits 14    Date for PT Re-Evaluation 02/09/22    Authorization Type UHC Medicare    Progress Note Due on Visit 42    PT Start Time 1017    PT Stop Time 1104    PT Time Calculation (min) 47 min    Activity Tolerance Patient tolerated treatment well    Behavior During Therapy WFL for tasks assessed/performed             Past Medical History:  Diagnosis Date   Cataracts, bilateral    Gastritis    GERD (gastroesophageal reflux disease)    Hypertension    Hyperthyroidism    OSA on CPAP    Squamous cell carcinoma of skin     Past Surgical History:  Procedure Laterality Date   ABDOMINAL HYSTERECTOMY     ENDOMETRIAL ABLATION     HERNIA REPAIR     as an infant   UPPER GASTROINTESTINAL ENDOSCOPY  11/09/2021   Has had sevearal in [place] ARTHROSCOPY Right     There were no vitals filed for this visit.   Subjective Assessment - 02/09/22 1021     Subjective Sleeping on either side is bothersome, night pain gets worse but overall I am better than what I was.    Pertinent History HTN, Hyperthyroidism, Osteoporosis, B cataracts, OSA, GERD, Vit B12 deficiency, chronic LBP, B hip and B shoulder pain, leg length discrepancy; History of 2 falls with fracture of right wrist 2020, right ankle 2018 - Had PT after previous fractures    Diagnostic tests MRI R hip - 05/03/21: 1. No acute findings or clear explanation for the patient's symptoms. Minimal degenerative changes of the hips.  2. Mild asymmetric right gluteus tendinosis without tear.  3. Lower  lumbar facet arthropathy with asymmetric involvement on the left at L4-5, potentially contributing to low back pain.   Lumbar x-ray - 10/23/21: Mild curvature lumbar spine, apex to the left. Grade 1 anterolisthesis of L4 versus L5. No other malalignment. No  fractures. Minimal degenerative disc disease. More significant lumbar facet degenerative changes. Calcified atherosclerosis in the abdominal aorta.    Patient Stated Goals "more flexibility and strength with no pain"    Currently in Pain? Yes    Pain Score 4     Pain Location Buttocks    Pain Orientation Right;Lateral    Pain Descriptors / Indicators Aching    Pain Type Chronic pain                [facility] PT Assessment - 02/09/22 0001       Assessment   Medical Diagnosis Chronic B hip pain & midline LBP    Referring Provider (PT) [name], MD    Onset Date/Surgical Date --   Fall 2022     Observation/Other Assessments   Focus on Therapeutic Outcomes (FOTO)  Hip = 49; predicted D/C FS = 61      AROM   Lumbar Flexion hands to just above ankles - stiffness    Lumbar Extension 60% limited -  stiffness & pain    Lumbar - Right Side Bend hand to lateral knee - > stiffness    Lumbar - Left Side Bend hand to lateral knee - stiffness    Lumbar - Right Rotation 25% - stiffness > pain in LB    Lumbar - Left Rotation [facility]      Strength   Right Hip Flexion 5/5    Right Hip Extension 4/5    Right Hip External Rotation  4+/5    Right Hip Internal Rotation 4/5    Right Hip ABduction 4/5    Right Hip ADduction 4+/5    Left Hip Flexion 5/5    Left Hip Extension 4/5    Left Hip External Rotation 4+/5    Left Hip Internal Rotation 4+/5    Left Hip ABduction 4+/5    Left Hip ADduction 4/5    Right Knee Flexion 4+/5    Right Knee Extension 4+/5    Left Knee Flexion 4+/5    Left Knee Extension 4+/5      Flexibility   Quadriceps mild/mod tight R>L    ITB mild/mod tight R>L    Piriformis mild/mod tight R>L                            [facility] Adult PT Treatment/Exercise - 02/09/22 0001       Knee/Hip Exercises: Aerobic   Recumbent Bike L3x74min                       PT Short Term Goals - 12/16/21 1156       PT SHORT TERM GOAL #1   Title Patient will be independent with initial HEP    Status Achieved   12/16/21              PT Long Term Goals - 02/09/22 1040       PT LONG TERM GOAL #1   Title Patient will be independent with ongoing/advanced HEP for self-management at home    Status Achieved   4/18   Target Date 02/09/22      PT LONG TERM GOAL #2   Title Patient will verbalize/demonstrate good awareness of neutral spine posture and proper body mechanics for daily tasks    Status Achieved   12/29/21     PT LONG TERM GOAL #3   Title Patient to report reduction in frequency and intensity of LBP and B hip pain by >/= 50-75% to allow for improved activity tolerance    Status Achieved   01/19/22 - Pt reports 60-65% reduction in R hip and LBP with no further pain into R LE, L hip pain remains resolved, walking tolerance for walking her dogs is improved     PT LONG TERM GOAL #4   Title Patient to demonstrate improved tissue quality and pliability as noted by improved hip and low back flexibility and ROM    Status Partially Met    Target Date 02/09/22      PT LONG TERM GOAL #5   Title Patient will demonstrate improved B LE strength to >/= 4/5 for improved stability and ease of mobility    Status Achieved   4/18   Target Date 02/09/22      PT LONG TERM GOAL #6   Title Patient to report ability to perform ADLs, household, and leisure activities without limitation due to LBP or B hip pain, LOM or weakness  Status Partially Met   12/29/21 - improving tolerance for walking the dogs (able to walk ~30 minutes now but will note increasing pain after 15-20 minutes)   Target Date 02/09/22                   Plan - 02/09/22 1108     Clinical Impression Statement Pt  noted that she was ready to finish with PT today. Hip/knee strength has improved and she has met her strength goal today. Hip flexibility remains about the same so LTG 4 is still partially met. Pt noted difficulty with mopping and has to try gardening but worried about how it will go as she has not tried it yet. FOTO scored was 61%, and she has exceeded the predicted D/C value. Lumbar ROM remains tight with ext, SB, and R rotation. Gave instruction and tips on stretching before doing household chores and also before bed as she did report having nocturnal pain. Provided education on referring to posture and BM handout given in past to avoid excessive strain on the lower back or hips. Pt will be placed on 30 day hold as of now she is pleased with her progress. As of now goals are partially met or fully achieved.             Patient will benefit from skilled therapeutic intervention in order to improve the following deficits and impairments:     Visit Diagnosis: Chronic midline low back pain without sciatica  Pain in right hip  Muscle weakness (generalized)  Other abnormalities of gait and mobility  Radiculopathy, lumbar region     Problem List Patient Active Problem List   Diagnosis Date Noted   Chronic pain of both feet 11/13/2021   Leg length discrepancy 11/13/2021   Vitamin B12 deficiency 09/02/2021   OSA on CPAP    Obesity hypoventilation syndrome ([place])    Fall 11/17/2020   Hyperlipemia 11/07/2020   Osteoporosis 09/30/2020   Hyperthyroidism    Hypertension    GERD (gastroesophageal reflux disease)    Gastritis    Cataracts, bilateral     Artist Pais, PTA 02/09/2022, 12:14 PM  [facility] [address]  [place], [place], [address] Phone: [phone]   Fax:  [phone]  Name: Bethany Cole MRN: [id number] Date of Birth: 19-Oct-1951   PHYSICAL THERAPY DISCHARGE SUMMARY  Visits from Start of Care:  13  Current functional level related to goals / functional outcomes:   Refer to above clinical impression for status as of last visit on 02/09/2022. Patient was placed on hold for 30 days and has not needed to return to PT, therefore will proceed with discharge from PT for this episode.   Remaining deficits:   As above.   Education / Equipment:   HEP   Patient agrees to discharge. Patient goals were partially met. Patient is being discharged due to being pleased with the current functional level.  Percival Spanish, PT, MPT 04/01/22, 5:15 PM  [facility] [address]  [place], [place], [address] Phone: [phone]   Fax:  [phone]

## 2022-02-10 ENCOUNTER — Encounter: Payer: Self-pay | Admitting: Internal Medicine

## 2022-02-10 ENCOUNTER — Ambulatory Visit: Payer: Medicare Other | Admitting: Internal Medicine

## 2022-02-10 VITALS — BP 110/74 | HR 80 | Ht 64.0 in | Wt 172.0 lb

## 2022-02-10 DIAGNOSIS — E0500 Thyrotoxicosis with diffuse goiter without thyrotoxic crisis or storm: Secondary | ICD-10-CM | POA: Diagnosis not present

## 2022-02-10 DIAGNOSIS — E0590 Thyrotoxicosis, unspecified without thyrotoxic crisis or storm: Secondary | ICD-10-CM

## 2022-02-10 LAB — TSH: TSH: 3.13 u[IU]/mL (ref 0.35–5.50)

## 2022-02-10 LAB — T4, FREE: Free T4: 0.85 ng/dL (ref 0.60–1.60)

## 2022-02-10 MED ORDER — METHIMAZOLE 5 MG PO TABS: 2.5000 mg | ORAL_TABLET | ORAL | 2 refills | Status: DC

## 2022-02-10 NOTE — Progress Notes (Signed)
? ?Name: Bethany Cole  ?MRN/ DOB: [id number], 07/09/1951    ?Age/ Sex: 71 y.o., female   ? ? ?PCP: Isaac Bliss, Rayford Halsted, MD   ?Reason for Endocrinology Evaluation: Hyperthyroidism  ?   ?Initial Endocrinology Clinic Visit: 12/03/2020  ? ? ?PATIENT IDENTIFIER: Bethany Cole is a 71 y.o., female with a past medical history of HTN, GERD and Hyperthyroidism. She has followed with [facility] since 12/03/2020 for consultative assistance with management of her Hyperthyroidism.  ? ? ?Moved from [place] 04/2020 ? ? ? ?HISTORICAL SUMMARY:  ?She has been diagnosed with hyperthyroidism in 11/2019 during routine work up with a TSH of 0.15 uIU/L , FT4 1.5 ng/dL ( 0.7-1.5 ng/dL) and an elevated T3 4.30 pg/mL ( 1.7-3.7) . She had worsening anxiety and tremors at the time , this was attributed to Bethany Cole ? ?On her initial visit at our clinic she was on methimazole 2.5 mg daily  ? ? ?No FH of thyroid disease  ?SUBJECTIVE:  ? ? ? ?Today (02/10/2022):  Bethany Cole is here for hyperthyroidism.  ? ?She has been noted with weight gain  ?Had decreased in activity due to hip pain  ?Has chronic abdominal pain and changes in BM due to IBS  ?Denies palpitation  ?Continues with  hand tremors  ?Has dry eyes and uses Restasis  ?Denies local neck symptoms  ?Anxiety has improving, sleeping better  ? ?She uses a CPAP at night  ? ?methimazole 5 mg, Half a tablet  Daily (skip Sundays) ? ?HISTORY:  ?Past Medical History:  ?Past Medical History:  ?Diagnosis Date  ? Cataracts, bilateral   ? Gastritis   ? GERD (gastroesophageal reflux disease)   ? Hypertension   ? Hyperthyroidism   ? OSA on CPAP   ? Squamous cell carcinoma of skin   ? ?Past Surgical History:  ?Past Surgical History:  ?Procedure Laterality Date  ? ABDOMINAL HYSTERECTOMY    ? ENDOMETRIAL ABLATION    ? HERNIA REPAIR    ? as an infant  ? UPPER GASTROINTESTINAL ENDOSCOPY  11/09/2021  ? Has had sevearal in [place]  ? WRIST ARTHROSCOPY Right   ? ?Social History:   reports that she has never smoked. She has never used smokeless tobacco. She reports current alcohol use of about 1.0 standard drink per week. She reports that she does not use drugs. ?Family History:  ?Family History  ?Problem Relation Age of Onset  ? Ovarian cancer Mother   ? Stomach cancer Mother   ?     mets from ovarian cancer  ? Heart disease Father   ? Cancer - Other Father   ?     laryngeal cancer  ? Colon cancer Neg Hx   ? Esophageal cancer Neg Hx   ? Pancreatic cancer Neg Hx   ? Liver disease Neg Hx   ? Colon polyps Neg Hx   ? Rectal cancer Neg Hx   ? ? ? ?HOME MEDICATIONS: ?Allergies as of 02/10/2022   ? ?   Reactions  ? Erythromycin   ? Macrobid [nitrofurantoin]   ? Penicillins   ? Sulfa Antibiotics   ? ?  ? ?  ?Medication List  ?  ? ?  ? Accurate as of February 10, 2022 10:46 AM. If you have any questions, ask your nurse or doctor.  ?  ?  ? ?  ? ?STOP taking these medications   ? ?moxifloxacin 0.5 % ophthalmic solution ?Commonly known as: VIGAMOX ?Stopped by: Secundino Ginger  Nell Gales, MD ?  ?prednisoLONE acetate 1 % ophthalmic suspension ?Commonly known as: PRED FORTE ?Stopped by: Dorita Sciara, MD ?  ? ?  ? ?TAKE these medications   ? ?acetaminophen 650 MG CR tablet ?Commonly known as: TYLENOL ?Take 650 mg by mouth at bedtime as needed for pain. ?  ?denosumab 60 MG/ML Sosy injection ?Commonly known as: PROLIA ?Inject 60 mg into the skin every 6 (six) months. Took in December 2022. ?  ?diclofenac Sodium 1 % Gel ?Commonly known as: VOLTAREN ?Apply topically 4 (four) times daily. ?  ?fluticasone 50 MCG/ACT nasal spray ?Commonly known as: Flonase ?Place 1 spray into both nostrils 2 (two) times daily. ?  ?irbesartan 75 MG tablet ?Commonly known as: AVAPRO ?TAKE 1 TABLET(75 MG) BY MOUTH DAILY ?  ?Magnesium 400 MG Tabs ?Take 400 mg by mouth 3 (three) times daily. ?  ?methimazole 5 MG tablet ?Commonly known as: TAPAZOLE ?Take 0.5 tablets (2.5 mg total) by mouth as directed. Half a tablet daily except Sundays ?   ?pantoprazole 40 MG tablet ?Commonly known as: PROTONIX ?Take 1 tablet (40 mg total) by mouth 2 (two) times daily. ?  ?Restasis 0.05 % ophthalmic emulsion ?Generic drug: cycloSPORINE ?1 drop 2 (two) times daily. ?  ?VITAMIN B-12 IJ ?Inject as directed every 30 (thirty) days. ?  ? ?  ? ? ? ? ?OBJECTIVE:  ? ?PHYSICAL EXAM: ?VS: BP 110/74 (BP Location: Left Arm, Patient Position: Sitting, Cuff Size: Small)   Pulse 80   Ht '5\' 4"'$  (1.626 m)   Wt 172 lb (78 kg)   SpO2 99%   BMI 29.52 kg/m?  ? ? ?EXAM: ?General: Pt appears well and is in NAD  ?Neck: General: Supple without adenopathy. ?Thyroid: Thyroid size normal.  No goiter or nodules appreciated.   ?Lungs: Clear with good BS bilat with no rales, rhonchi, or wheezes  ?Heart: Auscultation: RRR.  ?Abdomen: Normoactive bowel sounds, soft, nontender, without masses or organomegaly palpable  ?Extremities:  ?BL LE: No pretibial edema normal ROM and strength.  ?Mental Status: Judgment, insight: Intact ?Mood and affect: No depression, anxiety, or agitation  ? ? ? ?DATA REVIEWED: ? Latest Reference Range & Units 02/10/22 11:11  ?TSH 0.35 - 5.50 uIU/mL 3.13  ?T4,Free(Direct) 0.60 - 1.60 ng/dL 0.85  ? ? ? ?Results for [name], [name] (MRN [id number]) as of 04/09/2021 07:59 ? Ref. Range 12/03/2020 09:39  ?TRAB Latest Ref Range: <=2.00 IU/L 1.02  ? ? ?ASSESSMENT / PLAN / RECOMMENDATIONS:  ? ?Hyperthyroidism :  ? ? ?- This has been attributed to Graves' disease through a prior diagnosis. TRAB here was detectable but not elevated  at 1.02 IU/L but she already has been on methimazole at the time  ?- No local neck symptoms  ?- Clinically euthyroid  ?-TFTs continue to be normal we will reduce dose as below ? ?Medications  ? ?Change  Methimazole 5 mg , HALF a tablet Monday through Friday (skip Saturday and Sunday) ? ?F/U in 6 months  ? ? ? ? ?Signed electronically by: ?Abby Nena Jordan, MD ? ?[facility]  ?[facility] ?[address] ?[place], [place] [address] ?Phone: [phone] ?FAX: [phone]  ? ? ? ? ?CC: ?Isaac Bliss, Rayford Halsted, MD ?[place] ?[place] [address] ?Phone: [phone]  ?Fax: [phone] ? ? ?Return to Endocrinology clinic as below: ?Future Appointments  ?Date Time Provider [place]  ?02/16/2022 10:15 AM Sheffield, Ronalee Red, PA-C [facility]  ?02/18/2022  1:00  PM Isaac Bliss, Rayford Halsted, MD [id number]  ?  ? ?

## 2022-02-11 LAB — T3: T3, Total: 118 ng/dL (ref 76–181)

## 2022-02-12 ENCOUNTER — Ambulatory Visit: Payer: Medicare Other | Admitting: Physical Therapy

## 2022-02-16 ENCOUNTER — Ambulatory Visit: Payer: Medicare Other | Admitting: Physician Assistant

## 2022-02-16 ENCOUNTER — Encounter: Payer: Self-pay | Admitting: Physician Assistant

## 2022-02-16 DIAGNOSIS — Z85828 Personal history of other malignant neoplasm of skin: Secondary | ICD-10-CM | POA: Diagnosis not present

## 2022-02-16 DIAGNOSIS — Z1283 Encounter for screening for malignant neoplasm of skin: Secondary | ICD-10-CM

## 2022-02-16 DIAGNOSIS — L570 Actinic keratosis: Secondary | ICD-10-CM | POA: Diagnosis not present

## 2022-02-16 NOTE — Progress Notes (Signed)
? ?  Follow-Up Visit ?  ?Subjective  ?Bethany Cole is a 71 y.o. female who presents for the following: Annual Exam (No new concerns- Personal h/o non mole skin cancer but no melanoma. No family history of melanoma or non mole skin cancer. ). ? ? ?The following portions of the chart were reviewed this encounter and updated as appropriate:  Tobacco  Allergies  Meds  Problems  Med Hx  Surg Hx  Fam Hx   ?  ? ?Objective  ?Well appearing patient in no apparent distress; mood and affect are within normal limits. ? ?A full examination was performed including scalp, head, eyes, ears, nose, lips, neck, chest, axillae, abdomen, back, buttocks, bilateral upper extremities, bilateral lower extremities, hands, feet, fingers, toes, fingernails, and toenails. All findings within normal limits unless otherwise noted below. ? ?No atypical nevi or signs of NMSC noted at the time of the visit.  ? ?Right Dorsal Hand ?Erythematous patches with gritty scale. ? ? ?Assessment & Plan  ?AK (actinic keratosis) ?Right Dorsal Hand ? ?Destruction of lesion - Right Dorsal Hand ?Complexity: simple   ?Destruction method: cryotherapy   ?Informed consent: discussed and consent obtained   ?Timeout:  patient name, date of birth, surgical site, and procedure verified ?Lesion destroyed using liquid nitrogen: Yes   ?Cryotherapy cycles:  3 ?Outcome: patient tolerated procedure well with no complications   ? ?Encounter for screening for malignant neoplasm of skin ? ?Yearly skin examinations.  ? ? ? ?I, Hubbert Landrigan, PA-C, have reviewed all documentation's for this visit.  The documentation on 02/16/22 for the exam, diagnosis, procedures and orders are all accurate and complete. ?

## 2022-02-18 ENCOUNTER — Ambulatory Visit (INDEPENDENT_AMBULATORY_CARE_PROVIDER_SITE_OTHER): Payer: Medicare Other | Admitting: Internal Medicine

## 2022-02-18 ENCOUNTER — Encounter: Payer: Self-pay | Admitting: Internal Medicine

## 2022-02-18 VITALS — BP 130/80 | HR 64 | Temp 98.1°F | Ht 63.0 in | Wt 174.5 lb

## 2022-02-18 DIAGNOSIS — R0609 Other forms of dyspnea: Secondary | ICD-10-CM | POA: Diagnosis not present

## 2022-02-18 DIAGNOSIS — I10 Essential (primary) hypertension: Secondary | ICD-10-CM

## 2022-02-18 DIAGNOSIS — E538 Deficiency of other specified B group vitamins: Secondary | ICD-10-CM

## 2022-02-18 DIAGNOSIS — R5383 Other fatigue: Secondary | ICD-10-CM | POA: Diagnosis not present

## 2022-02-18 DIAGNOSIS — E785 Hyperlipidemia, unspecified: Secondary | ICD-10-CM

## 2022-02-18 DIAGNOSIS — Z0000 Encounter for general adult medical examination without abnormal findings: Secondary | ICD-10-CM | POA: Diagnosis not present

## 2022-02-18 DIAGNOSIS — Z9989 Dependence on other enabling machines and devices: Secondary | ICD-10-CM

## 2022-02-18 DIAGNOSIS — I700 Atherosclerosis of aorta: Secondary | ICD-10-CM | POA: Diagnosis not present

## 2022-02-18 DIAGNOSIS — E0590 Thyrotoxicosis, unspecified without thyrotoxic crisis or storm: Secondary | ICD-10-CM | POA: Diagnosis not present

## 2022-02-18 DIAGNOSIS — M810 Age-related osteoporosis without current pathological fracture: Secondary | ICD-10-CM | POA: Diagnosis not present

## 2022-02-18 DIAGNOSIS — G4733 Obstructive sleep apnea (adult) (pediatric): Secondary | ICD-10-CM

## 2022-02-18 LAB — CBC WITH DIFFERENTIAL/PLATELET
Basophils Absolute: 0 10*3/uL (ref 0.0–0.1)
Basophils Relative: 0.5 % (ref 0.0–3.0)
Eosinophils Absolute: 0.1 10*3/uL (ref 0.0–0.7)
Eosinophils Relative: 1.9 % (ref 0.0–5.0)
HCT: 36.1 % (ref 36.0–46.0)
Hemoglobin: 12 g/dL (ref 12.0–15.0)
Lymphocytes Relative: 21.9 % (ref 12.0–46.0)
Lymphs Abs: 1.6 10*3/uL (ref 0.7–4.0)
MCHC: 33.1 g/dL (ref 30.0–36.0)
MCV: 89.9 fl (ref 78.0–100.0)
Monocytes Absolute: 0.5 10*3/uL (ref 0.1–1.0)
Monocytes Relative: 7.3 % (ref 3.0–12.0)
Neutro Abs: 4.9 10*3/uL (ref 1.4–7.7)
Neutrophils Relative %: 68.4 % (ref 43.0–77.0)
Platelets: 229 10*3/uL (ref 150.0–400.0)
RBC: 4.02 Mil/uL (ref 3.87–5.11)
RDW: 14.5 % (ref 11.5–15.5)
WBC: 7.2 10*3/uL (ref 4.0–10.5)

## 2022-02-18 LAB — COMPREHENSIVE METABOLIC PANEL
ALT: 14 U/L (ref 0–35)
AST: 20 U/L (ref 0–37)
Albumin: 4.4 g/dL (ref 3.5–5.2)
Alkaline Phosphatase: 46 U/L (ref 39–117)
BUN: 16 mg/dL (ref 6–23)
CO2: 28 mEq/L (ref 19–32)
Calcium: 9.6 mg/dL (ref 8.4–10.5)
Chloride: 97 mEq/L (ref 96–112)
Creatinine, Ser: 0.67 mg/dL (ref 0.40–1.20)
GFR: 87.43 mL/min (ref >60.00)
Glucose, Bld: 90 mg/dL (ref 70–99)
Potassium: 4.4 mEq/L (ref 3.5–5.1)
Sodium: 132 mEq/L — ABNORMAL LOW (ref 135–145)
Total Bilirubin: 0.4 mg/dL (ref 0.2–1.2)
Total Protein: 6.8 g/dL (ref 6.0–8.3)

## 2022-02-18 LAB — LIPID PANEL
Cholesterol: 212 mg/dL — ABNORMAL HIGH (ref 0–200)
HDL: 84 mg/dL (ref >39.00)
LDL Cholesterol: 114 mg/dL — ABNORMAL HIGH (ref 0–99)
NonHDL: 128.03
Total CHOL/HDL Ratio: 3
Triglycerides: 69 mg/dL (ref 0.0–149.0)
VLDL: 13.8 mg/dL (ref 0.0–40.0)

## 2022-02-18 LAB — VITAMIN D 25 HYDROXY (VIT D DEFICIENCY, FRACTURES): VITD: 30.37 ng/mL (ref 30.00–100.00)

## 2022-02-18 LAB — VITAMIN B12: Vitamin B-12: 421 pg/mL (ref 211–911)

## 2022-02-18 LAB — HEMOGLOBIN A1C: Hgb A1c MFr Bld: 5.6 % (ref 4.6–6.5)

## 2022-02-18 NOTE — Progress Notes (Signed)
? ? ? ?Established Patient Office Visit ? ? ? ? ?This visit occurred during the SARS-CoV-2 public health emergency.  Safety protocols were in place, including screening questions prior to the visit, additional usage of staff PPE, and extensive cleaning of exam room while observing appropriate contact time as indicated for disinfecting solutions.  ? ? ?CC/Reason for Visit: Annual preventive exam, subsequent Medicare wellness visit, discuss fatigue ? ?HPI: Bethany Cole is a 71 y.o. female who is coming in today for the above mentioned reasons. Past Medical History is significant for: Hypertension, hyperlipidemia, hyperthyroidism on methimazole followed by endocrinology, obstructive sleep apnea on CPAP, GERD and osteoporosis.  She continues to complain today of excessive fatigue.  Now she endorses some dyspnea on exertion.  She states her house is on a hill and when she walks her dogs she feels very winded, this has been present for couple months.  We had discussed getting an echocardiogram if her symptoms persisted last visit.  She has routine eye and dental care, no perceived hearing difficulty.  Mammogram and colonoscopies are up-to-date, all immunizations are up-to-date and age-appropriate. ? ? ?Past Medical/Surgical History: ?Past Medical History:  ?Diagnosis Date  ? Cataracts, bilateral   ? Gastritis   ? GERD (gastroesophageal reflux disease)   ? Hypertension   ? Hyperthyroidism   ? OSA on CPAP   ? Squamous cell carcinoma of skin   ? ? ?Past Surgical History:  ?Procedure Laterality Date  ? ABDOMINAL HYSTERECTOMY    ? ENDOMETRIAL ABLATION    ? HERNIA REPAIR    ? as an infant  ? UPPER GASTROINTESTINAL ENDOSCOPY  11/09/2021  ? Has had sevearal in [place]  ? WRIST ARTHROSCOPY Right   ? ? ?Social History: ? reports that she has never smoked. She has never used smokeless tobacco. She reports current alcohol use of about 1.0 standard drink per week. She reports that she does not use drugs. ? ?Allergies: ?Allergies   ?Allergen Reactions  ? Erythromycin   ? Macrobid [Nitrofurantoin]   ? Penicillins   ? Sulfa Antibiotics   ? ? ?Family History:  ?Family History  ?Problem Relation Age of Onset  ? Ovarian cancer Mother   ? Stomach cancer Mother   ?     mets from ovarian cancer  ? Heart disease Father   ? Cancer - Other Father   ?     laryngeal cancer  ? Colon cancer Neg Hx   ? Esophageal cancer Neg Hx   ? Pancreatic cancer Neg Hx   ? Liver disease Neg Hx   ? Colon polyps Neg Hx   ? Rectal cancer Neg Hx   ? ? ? ?Current Outpatient Medications:  ?  acetaminophen (TYLENOL) 650 MG CR tablet, Take 650 mg by mouth at bedtime as needed for pain., Disp: , Rfl:  ?  Cyanocobalamin (VITAMIN B-12 IJ), Inject as directed every 30 (thirty) days., Disp: , Rfl:  ?  denosumab (PROLIA) 60 MG/ML SOSY injection, Inject 60 mg into the skin every 6 (six) months. Took in December 2022., Disp: , Rfl:  ?  diclofenac Sodium (VOLTAREN) 1 % GEL, Apply topically 4 (four) times daily., Disp: , Rfl:  ?  irbesartan (AVAPRO) 75 MG tablet, TAKE 1 TABLET(75 MG) BY MOUTH DAILY, Disp: 90 tablet, Rfl: 1 ?  Magnesium 400 MG TABS, Take 400 mg by mouth 3 (three) times daily., Disp: , Rfl:  ?  methimazole (TAPAZOLE) 5 MG tablet, Take 0.5 tablets (2.5 mg total) by mouth as  directed. Half a tablet Monday through Friday (skip Saturday and Sunday), Disp: 65 tablet, Rfl: 2 ?  pantoprazole (PROTONIX) 40 MG tablet, Take 1 tablet (40 mg total) by mouth 2 (two) times daily., Disp: 180 tablet, Rfl: 1 ?  RESTASIS 0.05 % ophthalmic emulsion, 1 drop 2 (two) times daily., Disp: , Rfl:  ?  fluticasone (FLONASE) 50 MCG/ACT nasal spray, Place 1 spray into both nostrils 2 (two) times daily., Disp: 16 g, Rfl: 0 ? ?Current Facility-Administered Medications:  ?  cyanocobalamin ((VITAMIN B-12)) injection 1,000 mcg, 1,000 mcg, Intramuscular, Q30 days, Isaac Bliss, Rayford Halsted, MD, 1,000 mcg at 12/30/21 3825 ? ?Review of Systems:  ?Constitutional: Denies fever, chills, diaphoresis, appetite  change.  ?HEENT: Denies photophobia, eye pain, redness, hearing loss, ear pain, congestion, sore throat, rhinorrhea, sneezing, mouth sores, trouble swallowing, neck pain, neck stiffness and tinnitus.   ?Respiratory: Denies SOB,  cough, chest tightness,  and wheezing.   ?Cardiovascular: Denies chest pain, palpitations and leg swelling.  ?Gastrointestinal: Denies nausea, vomiting, abdominal pain, diarrhea, constipation, blood in stool and abdominal distention.  ?Genitourinary: Denies dysuria, urgency, frequency, hematuria, flank pain and difficulty urinating.  ?Endocrine: Denies: hot or cold intolerance, sweats, changes in hair or nails, polyuria, polydipsia. ?Musculoskeletal: Denies myalgias, back pain, joint swelling, arthralgias and gait problem.  ?Skin: Denies pallor, rash and wound.  ?Neurological: Denies dizziness, seizures, syncope, weakness, light-headedness, numbness and headaches.  ?Hematological: Denies adenopathy. Easy bruising, personal or family bleeding history  ?Psychiatric/Behavioral: Denies suicidal ideation, mood changes, confusion, nervousness, sleep disturbance and agitation ? ? ? ?Physical Exam: ?Vitals:  ? 02/18/22 1300 02/18/22 1305  ?BP: 140/90 130/80  ?Pulse: 64   ?Temp: 98.1 ?F (36.7 ?C)   ?TempSrc: Oral   ?SpO2: 98%   ?Weight: 174 lb 8 oz (79.2 kg)   ?Height: '5\' 3"'$  (1.6 m)   ? ? ?Body mass index is 30.91 kg/m?. ? ? ?Constitutional: NAD, calm, comfortable ?Eyes: PERRL, lids and conjunctivae normal, wears corrective lenses ?ENMT: Mucous membranes are moist. Posterior pharynx clear of any exudate or lesions. Normal dentition. Tympanic membrane is pearly white, no erythema or bulging. ?Neck: normal, supple, no masses, no thyromegaly ?Respiratory: clear to auscultation bilaterally, no wheezing, no crackles. Normal respiratory effort. No accessory muscle use.  ?Cardiovascular: Regular rate and rhythm, no murmurs / rubs / gallops. No extremity edema. 2+ pedal pulses. No carotid bruits.  ?Abdomen:  no tenderness, no masses palpated. No hepatosplenomegaly. Bowel sounds positive.  ?Musculoskeletal: no clubbing / cyanosis. No joint deformity upper and lower extremities. Good ROM, no contractures. Normal muscle tone.  ?Skin: no rashes, lesions, ulcers. No induration ?Neurologic: CN 2-12 grossly intact. Sensation intact, DTR normal. Strength 5/5 in all 4.  ?Psychiatric: Normal judgment and insight. Alert and oriented x 3. Normal mood.  ? ? ?Subsequent Medicare wellness visit ?  ?1. Risk factors, based on past  M,S,F -cardiovascular disease risk factors include age, gender history of hypertension and hyperlipidemia ?  ?2.  Physical activities: Her physical activity has been limited due to her shortness of breath and fatigue but she does walk about a mile a day and does housework ?  ?3.  Depression/mood: Stable, not depressed ?  ?4.  Hearing: No perceived issues ?  ?5.  ADL's: Independent in all ADLs ?  ?6.  Fall risk: Low fall risk ?  ?7.  Home safety: No problems identified ?  ?8.  Height weight, and visual acuity: height and weight as above, vision: ? ?20/25 in the right, poor  vision in her left ?  ?9.  Counseling: Advised healthy lifestyle changes ?  ?10. Lab orders based on risk factors: Laboratory update will be reviewed ?  ?11. Referral : Cardiology, echocardiogram ?  ?12. Care plan: Follow-up with me in 6 months ?  ?13. Cognitive assessment: No cognitive impairment ?  ?14. Screening: Patient provided with a written and personalized 5-10 year screening schedule in the AVS. yes ?  ?15. Provider List Update: PCP, ophthalmology ? ?16. Advance Directives: Full code ? ? ?17. Opioids: Patient is not on any opioid prescriptions and has no risk factors for a substance use disorder. ? ? ?[facility] Office Visit from 02/18/2022 in [facility] at [facility]  ?PHQ-9 Total Score 8  ? ?  ? ? ? ?  09/09/2020  ?  3:43 PM 11/06/2020  ? 10:16 AM 08/28/2021  ?  1:16 PM 10/29/2021  ? 10:00 AM 02/18/2022  ?  1:09 PM  ?Fall  Risk  ?Falls in the past year? 0 0 0 0 0  ?Was there an injury with Fall? 0 0 0  0  ?Fall Risk Category Calculator 0 0 0  0  ?Fall Risk Category Low Low Low  Low  ?Patient Fall Risk Level    Low fall risk Low

## 2022-02-18 NOTE — Patient Instructions (Signed)
-  Nice seeing you today!! ? ?-Lab work today; will notify you once results are available. ? ?-Will set you up for echocardiogram and cardiology referral. ? ?-See you back in 6 months or sooner as needed. ? ? ?

## 2022-02-22 ENCOUNTER — Encounter: Payer: Self-pay | Admitting: Internal Medicine

## 2022-02-23 NOTE — Telephone Encounter (Addendum)
Prolia VOB initiated via [email or web address] ? ?Last OV: 11/13/21 ?Next OV:  ?Last Prolia inj: 09/28/21 ?Next Prolia inj DUE: 03/30/22 ? ?

## 2022-02-25 ENCOUNTER — Ambulatory Visit: Payer: Medicare Other | Admitting: Internal Medicine

## 2022-02-25 ENCOUNTER — Encounter: Payer: Self-pay | Admitting: Internal Medicine

## 2022-02-25 VITALS — BP 130/78 | HR 63 | Ht 63.05 in | Wt 174.2 lb

## 2022-02-25 DIAGNOSIS — R0609 Other forms of dyspnea: Secondary | ICD-10-CM

## 2022-02-25 NOTE — Patient Instructions (Signed)
Medication Instructions:  ?Your physician recommends that you continue on your current medications as directed. Please refer to the Current Medication list given to you today. ? ?Testing/Procedures: ?Your physician has requested that you have an echocardiogram. Echocardiography is a painless test that uses sound waves to create images of your heart. It provides your doctor with information about the size and shape of your heart and how well your heart?s chambers and valves are working. This procedure takes approximately one hour. There are no restrictions for this procedure. ? ?Your physician has requested that you have a stress echocardiogram. For further information please visit [email or web address]. Please follow instruction sheet as given. ? ?Follow-Up: ?At [facility], you and your health needs are our priority.  As part of our continuing mission to provide you with exceptional heart care, we have created designated Provider Care Teams.  These Care Teams include your primary Cardiologist (physician) and Advanced Practice Providers (APPs -  Physician Assistants and Nurse Practitioners) who all work together to provide you with the care you need, when you need it. ? ?We recommend signing up for the patient portal called "MyChart".  Sign up information is provided on this After Visit Summary.  MyChart is used to connect with patients for Virtual Visits (Telemedicine).  Patients are able to view lab/test results, encounter notes, upcoming appointments, etc.  Non-urgent messages can be sent to your provider as well.   ?To learn more about what you can do with MyChart, go to [email or web address].   ? ?Your next appointment:   ?AS NEEDED with Dr. Harl Bowie ? ? ?Important Information About Sugar ? ? ? ? ? ? ?

## 2022-02-25 NOTE — Progress Notes (Signed)
?Cardiology Office Note:   ? ?Date:  02/25/2022  ? ?ID:  Bethany Cole, DOB Mar 09, 1951, MRN [id number] ? ?PCP:  Isaac Bliss, Rayford Halsted, MD ?  ?[facility] Providers ?Cardiologist:  None    ? ?Referring MD: Isaac Bliss, Estel*  ? ?No chief complaint on file. ?DOE ? ?History of Present Illness:   ? ?Bethany Cole is a 70 y.o. female with a hx of GERD, HTN, hyperthyroidism, OSA on CPAP, squamous cell carcinoma of the skin s/p removal, referral for DOE ? ?She established care with her PCP.  She endorsed DOE for a couple of months. She's a non smoker. She notes fatigue. She walks her dogs and when she walks uphill it becomes more challenging. For 2 months. No chest pressure. No cardiac hx. 2.5 years ago in [place] had heart burn  and had normal cardiac w/u. Told she had a heart murmur, noted that it hasn't changed. Brother had  a new valve and has afib. He had an MI at 38.   ? ?Past Medical History:  ?Diagnosis Date  ? Cataracts, bilateral   ? Gastritis   ? GERD (gastroesophageal reflux disease)   ? Hypertension   ? Hyperthyroidism   ? OSA on CPAP   ? Squamous cell carcinoma of skin   ? ? ?Past Surgical History:  ?Procedure Laterality Date  ? ABDOMINAL HYSTERECTOMY    ? ENDOMETRIAL ABLATION    ? HERNIA REPAIR    ? as an infant  ? UPPER GASTROINTESTINAL ENDOSCOPY  11/09/2021  ? Has had sevearal in [place]  ? WRIST ARTHROSCOPY Right   ? ? ?Current Medications: ?Current Outpatient Medications on File Prior to Visit  ?Medication Sig Dispense Refill  ? acetaminophen (TYLENOL) 650 MG CR tablet Take 650 mg by mouth at bedtime as needed for pain.    ? Cyanocobalamin (VITAMIN B-12 IJ) Inject as directed every 30 (thirty) days.    ? denosumab (PROLIA) 60 MG/ML SOSY injection Inject 60 mg into the skin every 6 (six) months. Took in December 2022.    ? diclofenac Sodium (VOLTAREN) 1 % GEL Apply topically 4 (four) times daily.    ? fluticasone (FLONASE) 50 MCG/ACT nasal spray Place 1 spray into both nostrils 2 (two)  times daily. 16 g 0  ? irbesartan (AVAPRO) 75 MG tablet TAKE 1 TABLET(75 MG) BY MOUTH DAILY 90 tablet 1  ? Magnesium 400 MG TABS Take 400 mg by mouth 3 (three) times daily.    ? methimazole (TAPAZOLE) 5 MG tablet Take 0.5 tablets (2.5 mg total) by mouth as directed. Half a tablet Monday through Friday (skip Saturday and Sunday) 65 tablet 2  ? pantoprazole (PROTONIX) 40 MG tablet Take 1 tablet (40 mg total) by mouth 2 (two) times daily. 180 tablet 1  ? RESTASIS 0.05 % ophthalmic emulsion 1 drop 2 (two) times daily.    ? ?Current Facility-Administered Medications on File Prior to Visit  ?Medication Dose Route Frequency Provider Last Rate Last Admin  ? cyanocobalamin ((VITAMIN B-12)) injection 1,000 mcg  1,000 mcg Intramuscular Q30 days Isaac Bliss, Rayford Halsted, MD   1,000 mcg at 12/30/21 4081  ? ? ? ?Allergies:   Erythromycin, Macrobid [nitrofurantoin], Penicillins, and Sulfa antibiotics  ? ?Social History  ? ?Socioeconomic History  ? Marital status: Divorced  ?  Spouse name: Not on file  ? Number of children: Not on file  ? Years of education: Not on file  ? Highest education level: Not on file  ?Occupational History  ?  Not on file  ?Tobacco Use  ? Smoking status: Never  ? Smokeless tobacco: Never  ?Vaping Use  ? Vaping Use: Never used  ?Substance and Sexual Activity  ? Alcohol use: Yes  ?  Alcohol/week: 1.0 standard drink  ?  Types: 1 Glasses of wine per week  ?  Comment: occasional  ? Drug use: Never  ? Sexual activity: Not on file  ?Other Topics Concern  ? Not on file  ?Social History Narrative  ? Not on file  ? ?Social Determinants of Health  ? ?Financial Resource Strain: Not on file  ?Food Insecurity: Not on file  ?Transportation Needs: Not on file  ?Physical Activity: Not on file  ?Stress: Not on file  ?Social Connections: Not on file  ?  ? ?Family History: ?The patient's family history includes Cancer - Other in her father; Heart disease in her father; Ovarian cancer in her mother; Stomach cancer in her  mother. There is no history of Colon cancer, Esophageal cancer, Pancreatic cancer, Liver disease, Colon polyps, or Rectal cancer. ? ?ROS:   ?Please see the history of present illness.    ? All other systems reviewed and are negative. ? ?EKGs/Labs/Other Studies Reviewed:   ? ?The following studies were reviewed today: ? ? ?EKG:  EKG is  ? ordered today.  The ekg ordered today demonstrates  ? ?NSR, IRBBB, low voltage ? ?Recent Labs: ?02/10/2022: TSH 3.13 ?02/18/2022: ALT 14; BUN 16; Creatinine, Ser 0.67; Hemoglobin 12.0; Platelets 229.0; Potassium 4.4; Sodium 132  ?Recent Lipid Panel ?   ?Component Value Date/Time  ? CHOL 212 (H) 02/18/2022 1346  ? TRIG 69.0 02/18/2022 1346  ? HDL 84.00 02/18/2022 1346  ? CHOLHDL 3 02/18/2022 1346  ? VLDL 13.8 02/18/2022 1346  ? LDLCALC 114 (H) 02/18/2022 1346  ? ? ? ?Risk Assessment/Calculations:   ?  ? ?    ? ?Physical Exam:   ? ?VS:   ? ?Vitals:  ? 02/25/22 0923  ?BP: 130/78  ?Pulse: 63  ?SpO2: 97%  ? ? ? ?Wt Readings from Last 3 Encounters:  ?02/18/22 174 lb 8 oz (79.2 kg)  ?02/10/22 172 lb (78 kg)  ?11/13/21 172 lb (78 kg)  ?  ? ?GEN:  Well nourished, well developed in no acute distress ?HEENT: Normal ?NECK: No JVD; No carotid bruits ?LYMPHATICS: No lymphadenopathy ?CARDIAC: RRR, no murmurs, rubs, gallops ?RESPIRATORY:  Clear to auscultation without rales, wheezing or rhonchi  ?ABDOMEN: Soft, non-tender, non-distended ?MUSCULOSKELETAL:  No edema; No deformity  ?SKIN: Warm and dry ?NEUROLOGIC:  Alert and oriented x 3 ?PSYCHIATRIC:  Normal affect  ? ?ASSESSMENT:   ? ?DOE: she has risk including age and HTN, notes DOE that is progressive. Will plan for exercise echo. Noted possible valve disease followed in the past, will get a full TTE as well. ? ?HTN- good control  ? ? ?PLAN:   ? ?In order of problems listed above: ? ?Exercise Stress Echo ?TTE ?Follow up as needed (will reschedule if tests are abnormal) ? ?   ? ?Shared Decision Making/Informed Consent ?The risks [chest pain,  shortness of breath, cardiac arrhythmias, dizziness, blood pressure fluctuations, myocardial infarction, stroke/transient ischemic attack, and life-threatening complications (estimated to be 1 in 10,000)], benefits (risk stratification, diagnosing coronary artery disease, treatment guidance) and alternatives of a stress or dobutamine stress echocardiogram were discussed in detail with Ms. Secord and she agrees to proceed.  ? ? ?Medication Adjustments/Labs and Tests Ordered: ?Current medicines are reviewed at length with the patient  today.  Concerns regarding medicines are outlined above.  ?No orders of the defined types were placed in this encounter. ? ?No orders of the defined types were placed in this encounter. ? ? ?There are no Patient Instructions on file for this visit.  ? ?Signed, ?Janina Mayo, MD  ?02/25/2022 8:03 AM    ?[place] ?

## 2022-03-02 ENCOUNTER — Ambulatory Visit (INDEPENDENT_AMBULATORY_CARE_PROVIDER_SITE_OTHER): Payer: Medicare Other

## 2022-03-02 DIAGNOSIS — E538 Deficiency of other specified B group vitamins: Secondary | ICD-10-CM

## 2022-03-02 MED ORDER — CYANOCOBALAMIN 1000 MCG/ML IJ SOLN
1000.0000 ug | Freq: Once | INTRAMUSCULAR | Status: AC
  Administered 2022-03-02: 1000 ug via INTRAMUSCULAR

## 2022-03-02 NOTE — Progress Notes (Signed)
Pt here for monthly B12 injection per Dr Jerilee Hoh  ? ?B12 10103mg given IM, and pt tolerated injection well. ? ?Next B12 injection scheduled for 1 month. ?

## 2022-03-07 ENCOUNTER — Telehealth: Payer: Medicare Other | Admitting: Family

## 2022-03-07 DIAGNOSIS — U071 COVID-19: Secondary | ICD-10-CM

## 2022-03-07 MED ORDER — MOLNUPIRAVIR EUA 200MG CAPSULE: 4.0000 | ORAL_CAPSULE | Freq: Two times a day (BID) | ORAL | 0 refills | Status: AC

## 2022-03-07 NOTE — Progress Notes (Signed)
?Virtual Visit Consent  ? ?Bethany Cole, you are scheduled for a virtual visit with a [facility] provider today. Just as with appointments in the office, your consent must be obtained to participate. Your consent will be active for this visit and any virtual visit you may have with one of our providers in the next 365 days. If you have a MyChart account, a copy of this consent can be sent to you electronically. ? ?As this is a virtual visit, video technology does not allow for your provider to perform a traditional examination. This may limit your provider's ability to fully assess your condition. If your provider identifies any concerns that need to be evaluated in person or the need to arrange testing (such as labs, EKG, etc.), we will make arrangements to do so. Although advances in technology are sophisticated, we cannot ensure that it will always work on either your end or our end. If the connection with a video visit is poor, the visit may have to be switched to a telephone visit. With either a video or telephone visit, we are not always able to ensure that we have a secure connection. ? ?By engaging in this virtual visit, you consent to the provision of healthcare and authorize for your insurance to be billed (if applicable) for the services provided during this visit. Depending on your insurance coverage, you may receive a charge related to this service. ? ?I need to obtain your verbal consent now. Are you willing to proceed with your visit today? Bethany Cole has provided verbal consent on 03/07/2022 for a virtual visit (video or telephone). Evelina Dun, FNP ? ?Date: 03/07/2022 8:37 AM ? ?Virtual Visit via Video Note  ? ?IEvelina Dun, connected with  Bethany Cole  ([id number], 08-20-1951) on 03/07/22 at  8:30 AM EDT by a video-enabled telemedicine application and verified that I am speaking with the correct person using two identifiers. ? ?Location: ?Patient: Virtual Visit Location Patient:  Home ?Provider: Virtual Visit Location Provider: Home Office ?  ?I discussed the limitations of evaluation and management by telemedicine and the availability of in person appointments. The patient expressed understanding and agreed to proceed.   ? ?History of Present Illness: ?Bethany Cole is a 71 y.o. who identifies as a female who was assigned female at birth, and is being seen today for positive COVID test. Reports she started feeling fatigue and body aches yesterday.  ? ?HPI: Cough ?This is a new problem. The current episode started yesterday. The problem has been unchanged. The cough is Productive of brown sputum. Associated symptoms include chills, ear congestion, a fever, headaches, myalgias, nasal congestion, rhinorrhea, a sore throat and shortness of breath. Pertinent negatives include no ear pain. She has tried rest and OTC cough suppressant for the symptoms. The treatment provided mild relief.   ?Problems:  ?Patient Active Problem List  ? Diagnosis Date Noted  ? Chronic pain of both feet 11/13/2021  ? Leg length discrepancy 11/13/2021  ? Vitamin B12 deficiency 09/02/2021  ? OSA on CPAP   ? Obesity hypoventilation syndrome ([place])   ? Fall 11/17/2020  ? Hyperlipemia 11/07/2020  ? Osteoporosis 09/30/2020  ? Hyperthyroidism   ? Hypertension   ? GERD (gastroesophageal reflux disease)   ? Gastritis   ? Cataracts, bilateral   ?  ?Allergies:  ?Allergies  ?Allergen Reactions  ? Erythromycin   ? Macrobid [Nitrofurantoin]   ? Penicillins   ? Sulfa Antibiotics   ? ?Medications:  ?Current Outpatient Medications:  ?  molnupiravir EUA (LAGEVRIO) 200 mg CAPS capsule, Take 4 capsules (800 mg total) by mouth 2 (two) times daily for 5 days., Disp: 40 capsule, Rfl: 0 ?  acetaminophen (TYLENOL) 650 MG CR tablet, Take 650 mg by mouth at bedtime as needed for pain., Disp: , Rfl:  ?  Cyanocobalamin (VITAMIN B-12 IJ), Inject as directed every 30 (thirty) days., Disp: , Rfl:  ?  denosumab (PROLIA) 60 MG/ML SOSY injection, Inject  60 mg into the skin every 6 (six) months. Took in December 2022., Disp: , Rfl:  ?  diclofenac Sodium (VOLTAREN) 1 % GEL, Apply topically 4 (four) times daily., Disp: , Rfl:  ?  fluticasone (FLONASE) 50 MCG/ACT nasal spray, Place 1 spray into both nostrils 2 (two) times daily., Disp: 16 g, Rfl: 0 ?  irbesartan (AVAPRO) 75 MG tablet, TAKE 1 TABLET(75 MG) BY MOUTH DAILY, Disp: 90 tablet, Rfl: 1 ?  Magnesium 400 MG TABS, Take 400 mg by mouth 3 (three) times daily., Disp: , Rfl:  ?  methimazole (TAPAZOLE) 5 MG tablet, Take 0.5 tablets (2.5 mg total) by mouth as directed. Half a tablet Monday through Friday (skip Saturday and Sunday), Disp: 65 tablet, Rfl: 2 ?  pantoprazole (PROTONIX) 40 MG tablet, Take 1 tablet (40 mg total) by mouth 2 (two) times daily., Disp: 180 tablet, Rfl: 1 ?  RESTASIS 0.05 % ophthalmic emulsion, 1 drop 2 (two) times daily., Disp: , Rfl:  ? ?Current Facility-Administered Medications:  ?  cyanocobalamin ((VITAMIN B-12)) injection 1,000 mcg, 1,000 mcg, Intramuscular, Q30 days, Isaac Bliss, Rayford Halsted, MD, 1,000 mcg at 12/30/21 4656 ? ?Observations/Objective: ?Patient is well-developed, well-nourished in no acute distress.  ?Resting comfortably  at home.  ?Head is normocephalic, atraumatic.  ?No labored breathing.  ?Speech is clear and coherent with logical content.  ?Patient is alert and oriented at baseline.  ?Nasal congestion  ? ?Assessment and Plan: ?1. COVID-19 ?- molnupiravir EUA (LAGEVRIO) 200 mg CAPS capsule; Take 4 capsules (800 mg total) by mouth 2 (two) times daily for 5 days.  Dispense: 40 capsule; Refill: 0 ? ?COVID positive, rest, force fluids, tylenol as needed, Quarantine for at least 5 days and you are fever free, then must wear a mask out in public from day 8-12, report any worsening symptoms such as increased shortness of breath, swelling, or continued high fevers. Possible adverse effects discussed with antivirals.  ? ? ?Follow Up Instructions: ?I discussed the assessment and  treatment plan with the patient. The patient was provided an opportunity to ask questions and all were answered. The patient agreed with the plan and demonstrated an understanding of the instructions.  A copy of instructions were sent to the patient via MyChart unless otherwise noted below.  ? ? ? ?The patient was advised to call back or seek an in-person evaluation if the symptoms worsen or if the condition fails to improve as anticipated. ? ?Time:  ?I spent 12 minutes with the patient via telehealth technology discussing the above problems/concerns.   ? ?Evelina Dun, FNP ? ?

## 2022-03-07 NOTE — Patient Instructions (Signed)
10 Things You Can Do to Manage Your COVID-19 Symptoms at Home ?If you have possible or confirmed COVID-19 ?Stay home except to get medical care. ?Monitor your symptoms carefully. If your symptoms get worse, call your healthcare provider immediately. ?Get rest and stay hydrated. ?If you have a medical appointment, call the healthcare provider ahead of time and tell them that you have or may have COVID-19. ?For medical emergencies, call 911 and notify the dispatch personnel that you have or may have COVID-19. ?Cover your cough and sneezes with a tissue or use the inside of your elbow. ?Wash your hands often with soap and water for at least 20 seconds or clean your hands with an alcohol-based hand sanitizer that contains at least 60% alcohol. ?As much as possible, stay in a specific room and away from other people in your home. Also, you should use a separate bathroom, if available. If you need to be around other people in or outside of the home, wear a mask. ?Avoid sharing personal items with other people in your household, like dishes, towels, and bedding. ?Clean all surfaces that are touched often, like counters, tabletops, and doorknobs. Use household cleaning sprays or wipes according to the label instructions. ?cdc.gov/coronavirus ?05/09/2020 ?This information is not intended to replace advice given to you by your health care provider. Make sure you discuss any questions you have with your health care provider. ?Document Revised: 07/03/2021 Document Reviewed: 07/03/2021 ?Elsevier Patient Education ? 2022 Elsevier Inc. ? ?

## 2022-03-09 ENCOUNTER — Telehealth: Payer: Self-pay | Admitting: Internal Medicine

## 2022-03-09 NOTE — Telephone Encounter (Signed)
Prior auth required for PROLIA ? ?PA PROCESS DETAILS: Prior authorization is on file (Authorization # [id number]) and is valid from ?10/25/2021 through 10/24/2022. 999 units covered. The prior authorization department can be contacted at ?[phone]. ?

## 2022-03-09 NOTE — Telephone Encounter (Signed)
Pt diagnosed with covid Sunday, started antiviral on Sunday. has horrible cough, keeps her from sleeping, requesting something for the cough be sent to pharmacy. OTC offers no relief.   ?[id number] [address], [place] AT [address] [place] Phone:  [phone]  ?Fax:  [phone]  ?  ? ?

## 2022-03-10 DIAGNOSIS — I10 Essential (primary) hypertension: Secondary | ICD-10-CM | POA: Diagnosis not present

## 2022-03-10 DIAGNOSIS — G4733 Obstructive sleep apnea (adult) (pediatric): Secondary | ICD-10-CM | POA: Diagnosis not present

## 2022-03-11 ENCOUNTER — Other Ambulatory Visit: Payer: Self-pay | Admitting: Gastroenterology

## 2022-03-11 ENCOUNTER — Other Ambulatory Visit: Payer: Self-pay | Admitting: Internal Medicine

## 2022-03-11 DIAGNOSIS — I10 Essential (primary) hypertension: Secondary | ICD-10-CM

## 2022-03-11 DIAGNOSIS — K219 Gastro-esophageal reflux disease without esophagitis: Secondary | ICD-10-CM

## 2022-03-12 ENCOUNTER — Other Ambulatory Visit: Payer: Self-pay | Admitting: Internal Medicine

## 2022-03-12 DIAGNOSIS — Z1231 Encounter for screening mammogram for malignant neoplasm of breast: Secondary | ICD-10-CM

## 2022-03-13 NOTE — Telephone Encounter (Signed)
Prior Authorization initiated for [facility] via [facility] Provider portal.  Case ID: [id number]

## 2022-03-13 NOTE — Telephone Encounter (Signed)
Pt ready for scheduling on or after 03/30/22  Out-of-pocket cost due at time of visit: $301  Primary: [employer] Medicare Prolia co-insurance: 20% (approximately $276) Admin fee co-insurance: 20% (approximately $25)  Secondary: n/a Prolia co-insurance:  Admin fee co-insurance:   Deductible: does not apply  Prior Auth: APPROVED PA# [id number] Valid: 09/17/21-09/17/22  ** This summary of benefits is an estimation of the patient's out-of-pocket cost. Exact cost may vary based on individual plan coverage.

## 2022-03-15 ENCOUNTER — Telehealth (HOSPITAL_COMMUNITY): Payer: Self-pay | Admitting: *Deleted

## 2022-03-15 NOTE — Telephone Encounter (Signed)
Patient given detailed instructions per Myocardial Perfusion Study Information Sheet for the test on 03/18/22  Patient notified to arrive 15 minutes early and that it is imperative to arrive on time for appointment to keep from having the test rescheduled.  If you need to cancel or reschedule your appointment, please call the office within 24 hours of your appointment. . Patient verbalized understanding. Kirstie Peri

## 2022-03-17 ENCOUNTER — Encounter: Payer: Self-pay | Admitting: Internal Medicine

## 2022-03-17 ENCOUNTER — Ambulatory Visit (INDEPENDENT_AMBULATORY_CARE_PROVIDER_SITE_OTHER): Payer: Medicare Other | Admitting: Internal Medicine

## 2022-03-17 VITALS — BP 130/76 | HR 69 | Temp 97.4°F | Ht 63.05 in | Wt 171.3 lb

## 2022-03-17 DIAGNOSIS — G9331 Postviral fatigue syndrome: Secondary | ICD-10-CM

## 2022-03-17 DIAGNOSIS — U071 COVID-19: Secondary | ICD-10-CM | POA: Diagnosis not present

## 2022-03-17 MED ORDER — BENZONATATE 100 MG PO CAPS: 100.0000 mg | ORAL_CAPSULE | Freq: Two times a day (BID) | ORAL | 0 refills | Status: DC | PRN

## 2022-03-17 NOTE — Telephone Encounter (Signed)
Pt informed of below.  Nurse visit scheduled 03/30/22 '@945'$  am.

## 2022-03-17 NOTE — Progress Notes (Signed)
Established Patient Office Visit     CC/Reason for Visit: Discuss body aches and fatigue post COVID  HPI: Bethany Cole is a 71 y.o. female who is coming in today for the above mentioned reasons.  She was diagnosed with COVID on 5/13 after a positive home test.  She had a video visit and was prescribed molnupiravir.  She took for 5 days.  She continues to feel bad.  Most of her URI symptoms have resolved.  She continues to have bad body aches and fatigue.  She also has significant coughing that is nonproductive.  It is especially bothersome at nighttime.  Past Medical/Surgical History: Past Medical History:  Diagnosis Date   Cataracts, bilateral    Gastritis    GERD (gastroesophageal reflux disease)    Hypertension    Hyperthyroidism    OSA on CPAP    Squamous cell carcinoma of skin     Past Surgical History:  Procedure Laterality Date   ABDOMINAL HYSTERECTOMY     ENDOMETRIAL ABLATION     HERNIA REPAIR     as an infant   UPPER GASTROINTESTINAL ENDOSCOPY  11/09/2021   Has had sevearal in [place]   WRIST ARTHROSCOPY Right     Social History:  reports that she has never smoked. She has never used smokeless tobacco. She reports current alcohol use of about 1.0 standard drink per week. She reports that she does not use drugs.  Allergies: Allergies  Allergen Reactions   Erythromycin    Macrobid [Nitrofurantoin]    Penicillins    Sulfa Antibiotics     Family History:  Family History  Problem Relation Age of Onset   Ovarian cancer Mother    Stomach cancer Mother        mets from ovarian cancer   Heart disease Father    Cancer - Other Father        laryngeal cancer   Colon cancer Neg Hx    Esophageal cancer Neg Hx    Pancreatic cancer Neg Hx    Liver disease Neg Hx    Colon polyps Neg Hx    Rectal cancer Neg Hx      Current Outpatient Medications:    acetaminophen (TYLENOL) 650 MG CR tablet, Take 650 mg by mouth at bedtime as needed for pain., Disp: ,  Rfl:    Cyanocobalamin (VITAMIN B-12 IJ), Inject as directed every 30 (thirty) days., Disp: , Rfl:    denosumab (PROLIA) 60 MG/ML SOSY injection, Inject 60 mg into the skin every 6 (six) months. Took in December 2022., Disp: , Rfl:    diclofenac Sodium (VOLTAREN) 1 % GEL, Apply topically 4 (four) times daily., Disp: , Rfl:    irbesartan (AVAPRO) 75 MG tablet, TAKE 1 TABLET(75 MG) BY MOUTH DAILY, Disp: 90 tablet, Rfl: 1   loratadine (CLARITIN) 10 MG tablet, Take 10 mg by mouth daily., Disp: , Rfl:    Magnesium 400 MG TABS, Take 400 mg by mouth 3 (three) times daily., Disp: , Rfl:    methimazole (TAPAZOLE) 5 MG tablet, Take 0.5 tablets (2.5 mg total) by mouth as directed. Half a tablet Monday through Friday (skip Saturday and Sunday), Disp: 65 tablet, Rfl: 2   pantoprazole (PROTONIX) 40 MG tablet, TAKE 1 TABLET(40 MG) BY MOUTH TWICE DAILY, Disp: 180 tablet, Rfl: 1   RESTASIS 0.05 % ophthalmic emulsion, 1 drop 2 (two) times daily., Disp: , Rfl:    fluticasone (FLONASE) 50 MCG/ACT nasal spray, Place 1 spray into  both nostrils 2 (two) times daily., Disp: 16 g, Rfl: 0  Current Facility-Administered Medications:    cyanocobalamin ((VITAMIN B-12)) injection 1,000 mcg, 1,000 mcg, Intramuscular, Q30 days, Isaac Bliss, Rayford Halsted, MD, 1,000 mcg at 12/30/21 5361  Review of Systems:  Constitutional: Denies fever, chills, diaphoresis, appetite change.  HEENT: Denies photophobia, eye pain, redness,mouth sores, trouble swallowing, neck pain, neck stiffness and tinnitus.   Respiratory: Denies SOB, DOE,  chest tightness,  and wheezing.   Cardiovascular: Denies chest pain, palpitations and leg swelling.  Gastrointestinal: Denies nausea, vomiting, abdominal pain, diarrhea, constipation, blood in stool and abdominal distention.  Genitourinary: Denies dysuria, urgency, frequency, hematuria, flank pain and difficulty urinating.  Endocrine: Denies: hot or cold intolerance, sweats, changes in hair or nails,  polyuria, polydipsia. Musculoskeletal: Denies  back pain, joint swelling, arthralgias and gait problem.  Skin: Denies pallor, rash and wound.  Neurological: Denies dizziness, seizures, syncope, weakness, light-headedness, numbness and headaches.  Hematological: Denies adenopathy. Easy bruising, personal or family bleeding history  Psychiatric/Behavioral: Denies suicidal ideation, mood changes, confusion, nervousness, sleep disturbance and agitation    Physical Exam: Vitals:   03/17/22 1127  BP: 130/76  Pulse: 69  Temp: (!) 97.4 F (36.3 C)  TempSrc: Oral  SpO2: 98%  Weight: 171 lb 4.8 oz (77.7 kg)  Height: 5' 3.05" (1.601 m)    Body mass index is 30.3 kg/m.   Constitutional: NAD, calm, comfortable Eyes: PERRL, lids and conjunctivae normal, wears corrective lenses ENMT: Mucous membranes are moist. Posterior pharynx is erythematous but clear of any exudate or lesions. Normal dentition. Tympanic membrane is pearly white, no erythema or bulging. Neck: normal, supple, no masses, no thyromegaly Respiratory: clear to auscultation bilaterally, no wheezing, no crackles. Normal respiratory effort. No accessory muscle use.  Cardiovascular: Regular rate and rhythm, no murmurs / rubs / gallops. No extremity edema.  Psychiatric: Normal judgment and insight. Alert and oriented x 3. Normal mood.    Impression and Plan:  COVID  Postviral fatigue syndrome  -I suspect this is simply post COVID fatigue.  I will prescribe some Tessalon Perles to assist with coughing spasms.  Time spent:22 minutes reviewing chart, interviewing and examining patient and formulating plan of care.     Lelon Frohlich, MD [facility] at [facility]

## 2022-03-18 ENCOUNTER — Ambulatory Visit (HOSPITAL_COMMUNITY): Payer: Medicare Other

## 2022-03-26 ENCOUNTER — Encounter: Payer: Self-pay | Admitting: Gastroenterology

## 2022-03-29 ENCOUNTER — Ambulatory Visit
Admission: RE | Admit: 2022-03-29 | Discharge: 2022-03-29 | Disposition: A | Discharge status: Home / Self Care | Payer: Medicare Other | Source: Ambulatory Visit | Attending: Internal Medicine | Admitting: Internal Medicine

## 2022-03-29 ENCOUNTER — Ambulatory Visit: Payer: Medicare Other | Admitting: Gastroenterology

## 2022-03-29 DIAGNOSIS — Z1231 Encounter for screening mammogram for malignant neoplasm of breast: Secondary | ICD-10-CM

## 2022-03-30 ENCOUNTER — Encounter: Payer: Self-pay | Admitting: *Deleted

## 2022-03-30 ENCOUNTER — Ambulatory Visit: Payer: Medicare Other | Admitting: *Deleted

## 2022-03-30 DIAGNOSIS — M810 Age-related osteoporosis without current pathological fracture: Secondary | ICD-10-CM | POA: Diagnosis not present

## 2022-03-30 NOTE — Progress Notes (Signed)
Patient here for nurse visit for Prolia 60 mg. She received [place] injection in her left arm. She tolerated injection well. She return for her next Prolia in December 2023.

## 2022-04-01 ENCOUNTER — Other Ambulatory Visit: Payer: Self-pay | Admitting: Internal Medicine

## 2022-04-01 DIAGNOSIS — R0600 Dyspnea, unspecified: Secondary | ICD-10-CM

## 2022-04-01 NOTE — Addendum Note (Signed)
Addended by: Patria Mane A on: 04/01/2022 01:28 PM   Modules accepted: Orders

## 2022-04-02 ENCOUNTER — Ambulatory Visit (INDEPENDENT_AMBULATORY_CARE_PROVIDER_SITE_OTHER): Payer: Medicare Other

## 2022-04-02 DIAGNOSIS — E538 Deficiency of other specified B group vitamins: Secondary | ICD-10-CM | POA: Diagnosis not present

## 2022-04-02 NOTE — Progress Notes (Signed)
Pt here for monthly B12 injection per Dr Jerilee Hoh.  B12 1093mg given IM right deltiod and pt tolerated injection well.  Next B12 injection scheduled for 05/03/22.  Dr BElease Hashimoto Please cosign since PCP is out of office today.

## 2022-04-06 ENCOUNTER — Other Ambulatory Visit (HOSPITAL_COMMUNITY): Payer: Medicare Other

## 2022-04-08 NOTE — Addendum Note (Signed)
Addended byPhineas Inches on: 04/08/2022 08:02 AM   Modules accepted: Orders

## 2022-04-10 DIAGNOSIS — G4733 Obstructive sleep apnea (adult) (pediatric): Secondary | ICD-10-CM | POA: Diagnosis not present

## 2022-04-10 DIAGNOSIS — I10 Essential (primary) hypertension: Secondary | ICD-10-CM | POA: Diagnosis not present

## 2022-04-15 NOTE — Telephone Encounter (Signed)
Last Prolia inj 03/30/22 Next Prolia inj due 09/30/22 

## 2022-04-21 NOTE — Progress Notes (Signed)
I, Christoper Fabian, LAT, ATC, am serving as scribe for Dr. Clementeen Graham.  Bethany Cole is a 71 y.o. female who presents to [facility] at [facility] today for f/u of R hip and LBP.  She was last seen by Dr. Denyse Amass on 10/23/21 and noted increased pain in her B lateral hips and low back, especially at night.  She asked to be re-referred to PT, as she had been previously and stopped doing her HEP so wanted a refresher.  She was also referred to Dr. Jordan Likes for orthotics.  Today, pt reports has continued HEP and feels like 1 of the exercises irritates her pain. Pt notes pain will vary day-to-day. Pt locates pain to lateral aspect, slightly posterior aspect of the R hip. Pt c/o pain radiating distally through R thigh to R knee. No numbness/tingling noted.   Dx imaging: L-spine XR- 10/23/21 05/03/21 R hip MRI             04/29/21 R hip XR  Pertinent review of systems: No fevers or chills  Relevant historical information: Hypothyroidism.  Osteoporosis.   Exam:  BP (!) 162/90   Pulse 63   Ht 5\' 3"  (1.6 m)   Wt 171 lb 12.8 oz (77.9 kg)   SpO2 98%   BMI 30.43 kg/m  General: Well Developed, well nourished, and in no acute distress.   MSK: L-spine: Nontender midline. Decreased lumbar motion. Extremity strength is intact. Right hip normal. Normal motion tender palpation greater trochanter.    Assessment and Plan: 71 y.o. female with right leg pain multifactorial.  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  However she does have low back pain on the right side and pain radiating down the right leg to the lateral thigh to the lateral calf which could be L5 lumbar radiculopathy.  At this point she is done agreat job with physical therapy but still having symptoms.  Plan for MRI lumbar spine to further characterize source of back pain and lumbar radiculopathy and proceed to potential epidural steroid injection planning.   Additionally short course of prednisone and gabapentin.  Likely will arrange for epidural steroid injection based on the results of the MRI.  Additionally could proceed to facet injection planning as well.  Likely will check back after future injection and MRI results back.   PDMP not reviewed this encounter. Orders Placed This Encounter  Procedures   MR LUMBAR SPINE WO CONTRAST    Standing Status:   Future    Standing Expiration Date:   05/22/2022    Order Specific Question:   What is the patient's sedation requirement?    Answer:   No Sedation    Order Specific Question:   Does the patient have a pacemaker or implanted devices?    Answer:   No    Order Specific Question:   Preferred imaging location?    Answer:   [facility] (table limit-350lbs)   Meds ordered this encounter  Medications   gabapentin (NEURONTIN) 100 MG capsule    Sig: Take 1 capsule (100 mg total) by mouth at bedtime.    Dispense:  30 capsule    Refill:  3   predniSONE (DELTASONE) 50 MG tablet    Sig: Take 1 tablet (50 mg total) by mouth daily with breakfast for 5 days.    Dispense:  5 tablet    Refill:  0     Discussed  warning signs or symptoms. Please see discharge instructions. Patient expresses understanding.  The above documentation has been reviewed and is accurate and complete Clementeen Graham, M.D.  Total encounter time 30 minutes including face-to-face time with the patient and, reviewing past medical record, and charting on the date of service.   Discussion treatment plan and options

## 2022-04-22 ENCOUNTER — Ambulatory Visit: Payer: Medicare Other | Admitting: Family Medicine

## 2022-04-22 VITALS — BP 162/90 | HR 63 | Ht 63.0 in | Wt 171.8 lb

## 2022-04-22 DIAGNOSIS — M5442 Lumbago with sciatica, left side: Secondary | ICD-10-CM | POA: Diagnosis not present

## 2022-04-22 DIAGNOSIS — G8929 Other chronic pain: Secondary | ICD-10-CM

## 2022-04-22 DIAGNOSIS — M5441 Lumbago with sciatica, right side: Secondary | ICD-10-CM | POA: Diagnosis not present

## 2022-04-22 MED ORDER — PREDNISONE 50 MG PO TABS: 50.0000 mg | ORAL_TABLET | Freq: Every day | ORAL | 0 refills | Status: AC

## 2022-04-22 MED ORDER — GABAPENTIN 100 MG PO CAPS: 100.0000 mg | ORAL_CAPSULE | Freq: Every day | ORAL | 3 refills | Status: DC

## 2022-04-22 NOTE — Patient Instructions (Addendum)
Thank you for coming in today.   You should hear from MRI scheduling within 1 week. If you do not hear please let me know.    I've sent a prescription for Gabapentin and prednisone to your pharmacy.   Follow-up after MRI.

## 2022-04-25 ENCOUNTER — Ambulatory Visit (INDEPENDENT_AMBULATORY_CARE_PROVIDER_SITE_OTHER): Payer: Medicare Other

## 2022-04-25 DIAGNOSIS — M5441 Lumbago with sciatica, right side: Secondary | ICD-10-CM

## 2022-04-25 DIAGNOSIS — M4726 Other spondylosis with radiculopathy, lumbar region: Secondary | ICD-10-CM | POA: Diagnosis not present

## 2022-04-25 DIAGNOSIS — G8929 Other chronic pain: Secondary | ICD-10-CM | POA: Diagnosis not present

## 2022-04-25 DIAGNOSIS — M5116 Intervertebral disc disorders with radiculopathy, lumbar region: Secondary | ICD-10-CM | POA: Diagnosis not present

## 2022-05-03 ENCOUNTER — Ambulatory Visit (INDEPENDENT_AMBULATORY_CARE_PROVIDER_SITE_OTHER): Payer: Medicare Other | Admitting: *Deleted

## 2022-05-03 ENCOUNTER — Encounter: Payer: Self-pay | Admitting: Family Medicine

## 2022-05-03 DIAGNOSIS — E538 Deficiency of other specified B group vitamins: Secondary | ICD-10-CM

## 2022-05-03 DIAGNOSIS — G8929 Other chronic pain: Secondary | ICD-10-CM

## 2022-05-03 MED ORDER — CYANOCOBALAMIN 1000 MCG/ML IJ SOLN
1000.0000 ug | Freq: Once | INTRAMUSCULAR | Status: AC
  Administered 2022-05-03: 1000 ug via INTRAMUSCULAR

## 2022-05-03 NOTE — Progress Notes (Signed)
Per orders of Dr. Hernandez, injection of Cyanocobalamin 1000mcg given by Odai Wimmer A. Patient tolerated injection well. 

## 2022-05-03 NOTE — Progress Notes (Signed)
MRI lumbar spine shows some arthritis changes in the back and the potential for pinched nerve worse on the right.  This could cause the right L4 nerve to get pinched.  You have an appointment with me on the 14th.  We can talk about the results of this MRI in full detail.  Likely we should proceed to an injection in your back to help with leg pain.  I can order that now if you would like me to.

## 2022-05-05 ENCOUNTER — Telehealth (HOSPITAL_COMMUNITY): Payer: Self-pay

## 2022-05-05 NOTE — Telephone Encounter (Signed)
Spoke with the patient, detailed instructions were given. She stated that she would be here for her test. Asked to call back with any questions. Bethany Cole EMTP 

## 2022-05-06 ENCOUNTER — Ambulatory Visit (HOSPITAL_COMMUNITY): Payer: Medicare Other | Attending: Internal Medicine

## 2022-05-06 ENCOUNTER — Ambulatory Visit (HOSPITAL_BASED_OUTPATIENT_CLINIC_OR_DEPARTMENT_OTHER): Payer: Medicare Other

## 2022-05-06 ENCOUNTER — Ambulatory Visit: Payer: Medicare Other | Admitting: Family Medicine

## 2022-05-06 DIAGNOSIS — R0609 Other forms of dyspnea: Secondary | ICD-10-CM

## 2022-05-06 LAB — ECHOCARDIOGRAM COMPLETE
Area-P 1/2: 3.48 cm2
S' Lateral: 2.8 cm

## 2022-05-06 MED ORDER — PERFLUTREN LIPID MICROSPHERE
1.0000 mL | INTRAVENOUS | Status: AC | PRN
  Administered 2022-05-06 (×3): 1 mL via INTRAVENOUS

## 2022-05-06 NOTE — Progress Notes (Signed)
I, Wendy Poet, LAT, ATC, am serving as scribe for Dr. [name].  Bethany Cole is a 71 y.o. female who presents to [place] [facility] today for f/u of R hip and LBP and to review her L-spine MRI results.  She was also prescribed gabapentin and a short course of prednisone.  Today, pt reports that she feels like the gabapentin helped a bit, particularly w/ sleeping.  She has not taken the prednisone.  Today, she locates her pain to her R lateral hip and proximal thigh that then skips down to her R lateral calf.  She has been getting more consistent low back pain.  Diagnostic testing: L-spine MRI- 04/25/22; L-spine XR- 10/23/21; R hip MRI- 05/03/21; R hip XR- 04/29/21  Pertinent review of systems: No fevers or chills  Relevant historical information: Vitamin B12 deficiency.  Sleep apnea.   Exam:  BP 124/86 (BP Location: Right Arm, Patient Position: Sitting, Cuff Size: Normal)   Pulse 60   Ht '5\' 3"'$  (1.6 m)   Wt 170 lb (77.1 kg)   SpO2 98%   BMI 30.11 kg/m  General: Well Developed, well nourished, and in no acute distress.   MSK: L-spine:  Non-tender lumbar spine  Normal lumbar motion. Lower extremity strength is intact. Positive slump test.    Lab and Radiology Results  EXAM: MRI LUMBAR SPINE WITHOUT CONTRAST   TECHNIQUE: Multiplanar, multisequence MR imaging of the lumbar spine was performed. No intravenous contrast was administered.   COMPARISON:  Radiography 10/23/2021   FINDINGS: Segmentation:  5 lumbar type vertebral bodies.   Alignment:  3-4 mm degenerative anterolisthesis L4-5.   Vertebrae:  No fracture or focal bone lesion.   Conus medullaris and cauda equina: Conus extends to the L1-2 level. Conus and cauda equina appear normal.   Paraspinal and other soft tissues: Negative   Disc levels:   No abnormality from T11-12 through L2-3.   L3-4: Mild bulging of the disc. Mild facet and ligamentous hypertrophy. Mild canal narrowing  but no compressive stenosis. The facet arthritis could contribute to back pain.   L4-5: Chronic facet arthropathy with 3-4 mm of degenerative anterolisthesis which could worsen with standing or flexion. Mild bulging of the disc. Stenosis of both subarticular lateral recesses, particularly severe on the right due to inferior facet osteophytes. Tiny synovial cyst projecting into the foramen on the right. Mild facet joint edema and small effusions. Findings at this level could cause mechanical back pain, referred facet syndrome pain or symptoms of neural compression.   L5-S1: Chronic disc degeneration with loss of disc height. Mild facet osteoarthritis. No compressive narrowing of the canal or foramina.   IMPRESSION: L4-5: Chronic facet arthropathy with 3-4 mm of degenerative anterolisthesis. Stenosis of the lateral recesses that could cause neural compression on either or both sides. This is particularly notable on the right due to inferior facet osteophytes. Small synovial cyst projects into the foramen on the right, without gross affect upon the exiting right L4 nerve. Findings at this level could relate to localized back pain, referred facet syndrome pain, or radicular pain.   L3-4: Disc bulge. Mild facet hypertrophy. Mild canal narrowing but without visible neural compression. The facet arthritis could be painful.   L5-S1: Chronic disc degeneration with loss of disc height. No compressive stenosis.     Electronically Signed   By: Davie Chimes M.D.   On: 04/26/2022 13:10   I, [name], personally (independently) visualized and performed the interpretation of the  images attached in this note.     Assessment and Plan: 71 y.o. female with lumbar radiculopathy right leg to be primarily right L4 with some right L5 component.  She already has an epidural steroid injection scheduled for July 17..  I ordered the injection as soon as the results of the MRI came back. Hopefully  this will provide Lasting relief.  We could proceed to facet injection as well if needed.  Target should probably be right L3-4 and L4-L5.  Patient will keep me updated following the injection.    Discussed warning signs or symptoms. Please see discharge instructions. Patient expresses understanding.   The above documentation has been reviewed and is accurate and complete [name], M.D.

## 2022-05-07 ENCOUNTER — Encounter: Payer: Self-pay | Admitting: Family Medicine

## 2022-05-07 ENCOUNTER — Ambulatory Visit (INDEPENDENT_AMBULATORY_CARE_PROVIDER_SITE_OTHER): Payer: Medicare Other | Admitting: Family Medicine

## 2022-05-07 VITALS — BP 124/86 | HR 60 | Ht 63.0 in | Wt 170.0 lb

## 2022-05-07 DIAGNOSIS — G8929 Other chronic pain: Secondary | ICD-10-CM

## 2022-05-07 DIAGNOSIS — M5442 Lumbago with sciatica, left side: Secondary | ICD-10-CM

## 2022-05-07 DIAGNOSIS — M5441 Lumbago with sciatica, right side: Secondary | ICD-10-CM | POA: Diagnosis not present

## 2022-05-07 NOTE — Patient Instructions (Addendum)
Good to see you today.  Proceed w/ your lumbar epidural scheduled for 05/10/22.    Follow-up: as needed

## 2022-05-10 ENCOUNTER — Ambulatory Visit
Admission: RE | Admit: 2022-05-10 | Discharge: 2022-05-10 | Disposition: A | Discharge status: Home / Self Care | Payer: Medicare Other | Source: Ambulatory Visit | Attending: Family Medicine | Admitting: Family Medicine

## 2022-05-10 DIAGNOSIS — M5116 Intervertebral disc disorders with radiculopathy, lumbar region: Secondary | ICD-10-CM | POA: Diagnosis not present

## 2022-05-10 DIAGNOSIS — I10 Essential (primary) hypertension: Secondary | ICD-10-CM | POA: Diagnosis not present

## 2022-05-10 DIAGNOSIS — G8929 Other chronic pain: Secondary | ICD-10-CM

## 2022-05-10 DIAGNOSIS — M47817 Spondylosis without myelopathy or radiculopathy, lumbosacral region: Secondary | ICD-10-CM | POA: Diagnosis not present

## 2022-05-10 DIAGNOSIS — G4733 Obstructive sleep apnea (adult) (pediatric): Secondary | ICD-10-CM | POA: Diagnosis not present

## 2022-05-10 MED ORDER — METHYLPREDNISOLONE ACETATE 40 MG/ML INJ SUSP (RADIOLOG
80.0000 mg | Freq: Once | INTRAMUSCULAR | Status: AC
  Administered 2022-05-10: 80 mg via EPIDURAL

## 2022-05-10 MED ORDER — IOPAMIDOL (ISOVUE-M 200) INJECTION 41%
1.0000 mL | Freq: Once | INTRAMUSCULAR | Status: AC
  Administered 2022-05-10: 1 mL via EPIDURAL

## 2022-05-10 NOTE — Discharge Instructions (Signed)

## 2022-06-03 ENCOUNTER — Encounter: Payer: Self-pay | Admitting: Family Medicine

## 2022-06-03 DIAGNOSIS — G8929 Other chronic pain: Secondary | ICD-10-CM

## 2022-06-04 ENCOUNTER — Ambulatory Visit (INDEPENDENT_AMBULATORY_CARE_PROVIDER_SITE_OTHER): Payer: Medicare Other

## 2022-06-04 DIAGNOSIS — E538 Deficiency of other specified B group vitamins: Secondary | ICD-10-CM

## 2022-06-04 MED ORDER — CYANOCOBALAMIN 1000 MCG/ML IJ SOLN
1000.0000 ug | Freq: Once | INTRAMUSCULAR | Status: AC
  Administered 2022-06-04: 1000 ug via INTRAMUSCULAR

## 2022-06-04 NOTE — Progress Notes (Signed)
Per orders of Dr. Elease Hashimoto, injection of VitB12 1000 mcg given by Encarnacion Slates. Patient tolerated injection well.   Next VitB12 injection is due next month.

## 2022-06-09 DIAGNOSIS — G4733 Obstructive sleep apnea (adult) (pediatric): Secondary | ICD-10-CM | POA: Diagnosis not present

## 2022-06-09 DIAGNOSIS — I10 Essential (primary) hypertension: Secondary | ICD-10-CM | POA: Diagnosis not present

## 2022-06-10 DIAGNOSIS — I10 Essential (primary) hypertension: Secondary | ICD-10-CM | POA: Diagnosis not present

## 2022-06-10 DIAGNOSIS — G4733 Obstructive sleep apnea (adult) (pediatric): Secondary | ICD-10-CM | POA: Diagnosis not present

## 2022-06-18 ENCOUNTER — Ambulatory Visit
Admission: RE | Admit: 2022-06-18 | Discharge: 2022-06-18 | Disposition: A | Discharge status: Home / Self Care | Payer: Medicare Other | Source: Ambulatory Visit | Attending: Family Medicine | Admitting: Family Medicine

## 2022-06-18 DIAGNOSIS — G8929 Other chronic pain: Secondary | ICD-10-CM

## 2022-06-18 DIAGNOSIS — M4727 Other spondylosis with radiculopathy, lumbosacral region: Secondary | ICD-10-CM | POA: Diagnosis not present

## 2022-06-18 MED ORDER — METHYLPREDNISOLONE ACETATE 40 MG/ML INJ SUSP (RADIOLOG
80.0000 mg | Freq: Once | INTRAMUSCULAR | Status: AC
  Administered 2022-06-18: 80 mg via EPIDURAL

## 2022-06-18 MED ORDER — IOPAMIDOL (ISOVUE-M 200) INJECTION 41%
1.0000 mL | Freq: Once | INTRAMUSCULAR | Status: AC
  Administered 2022-06-18: 1 mL via EPIDURAL

## 2022-06-18 NOTE — Discharge Instructions (Signed)

## 2022-06-22 ENCOUNTER — Ambulatory Visit: Payer: Medicare Other | Admitting: Physician Assistant

## 2022-07-06 ENCOUNTER — Ambulatory Visit (INDEPENDENT_AMBULATORY_CARE_PROVIDER_SITE_OTHER): Payer: Medicare Other

## 2022-07-06 DIAGNOSIS — E538 Deficiency of other specified B group vitamins: Secondary | ICD-10-CM

## 2022-07-06 NOTE — Progress Notes (Signed)
Pt here for monthly B12 injection per Dr Jerilee Hoh.  B12 1063mg given IM right deltoid and pt tolerated injection well.  Next B12 injection scheduled for 08/05/22.

## 2022-07-08 DIAGNOSIS — H04123 Dry eye syndrome of bilateral lacrimal glands: Secondary | ICD-10-CM | POA: Diagnosis not present

## 2022-07-08 DIAGNOSIS — H16201 Unspecified keratoconjunctivitis, right eye: Secondary | ICD-10-CM | POA: Diagnosis not present

## 2022-07-12 DIAGNOSIS — H16201 Unspecified keratoconjunctivitis, right eye: Secondary | ICD-10-CM | POA: Diagnosis not present

## 2022-07-12 DIAGNOSIS — H5711 Ocular pain, right eye: Secondary | ICD-10-CM | POA: Diagnosis not present

## 2022-07-19 ENCOUNTER — Encounter: Payer: Self-pay | Admitting: Internal Medicine

## 2022-07-19 ENCOUNTER — Ambulatory Visit (INDEPENDENT_AMBULATORY_CARE_PROVIDER_SITE_OTHER): Payer: Medicare Other | Admitting: Internal Medicine

## 2022-07-19 VITALS — BP 124/84 | HR 70 | Temp 97.9°F | Wt 165.5 lb

## 2022-07-19 DIAGNOSIS — G4700 Insomnia, unspecified: Secondary | ICD-10-CM

## 2022-07-19 DIAGNOSIS — Z1283 Encounter for screening for malignant neoplasm of skin: Secondary | ICD-10-CM

## 2022-07-19 MED ORDER — TRAZODONE HCL 50 MG PO TABS: 25.0000 mg | ORAL_TABLET | Freq: Every evening | ORAL | 3 refills | Status: DC | PRN

## 2022-07-19 NOTE — Progress Notes (Signed)
Established Patient Office Visit     CC/Reason for Visit: Insomnia  HPI: Bethany Cole is a 71 y.o. female who is coming in today for the above mentioned reasons.  She is here today to discuss her insomnia.  She recalls that about 20 or 30 years ago she had a bout of insomnia that was related to anxiety that responded very well to trazodone which she was able to wean off of without any issues.  She states now that 4 weeks she has been having issues where she wakes up at 2 or 3:00 in the morning so she is only getting about 4 to 5 hours of sleep.  She has difficulty falling back asleep.  This has caused increased fatigue and daytime somnolence.  She does have a history of sleep apnea and has been wearing her CPAP routinely.  She does not feel depressed or anxious.  Past Medical/Surgical History: Past Medical History:  Diagnosis Date   Cataracts, bilateral    Gastritis    GERD (gastroesophageal reflux disease)    Hypertension    Hyperthyroidism    OSA on CPAP    Squamous cell carcinoma of skin     Past Surgical History:  Procedure Laterality Date   ABDOMINAL HYSTERECTOMY     ENDOMETRIAL ABLATION     HERNIA REPAIR     as an infant   UPPER GASTROINTESTINAL ENDOSCOPY  11/09/2021   Has had sevearal in [place]   WRIST ARTHROSCOPY Right     Social History:  reports that she has never smoked. She has never used smokeless tobacco. She reports current alcohol use of about 1.0 standard drink of alcohol per week. She reports that she does not use drugs.  Allergies: Allergies  Allergen Reactions   Erythromycin    Macrobid [Nitrofurantoin]    Penicillins    Sulfa Antibiotics     Family History:  Family History  Problem Relation Age of Onset   Ovarian cancer Mother    Stomach cancer Mother        mets from ovarian cancer   Heart disease Father    Cancer - Other Father        laryngeal cancer   Colon cancer Neg Hx    Esophageal cancer Neg Hx    Pancreatic cancer Neg Hx     Liver disease Neg Hx    Colon polyps Neg Hx    Rectal cancer Neg Hx      Current Outpatient Medications:    acetaminophen (TYLENOL) 650 MG CR tablet, Take 650 mg by mouth at bedtime as needed for pain., Disp: , Rfl:    benzonatate (TESSALON) 100 MG capsule, Take 1 capsule (100 mg total) by mouth 2 (two) times daily as needed for cough., Disp: 20 capsule, Rfl: 0   Cyanocobalamin (VITAMIN B-12 IJ), Inject as directed every 30 (thirty) days., Disp: , Rfl:    denosumab (PROLIA) 60 MG/ML SOSY injection, Inject 60 mg into the skin every 6 (six) months. Took in December 2022., Disp: , Rfl:    diclofenac Sodium (VOLTAREN) 1 % GEL, Apply topically 4 (four) times daily., Disp: , Rfl:    gabapentin (NEURONTIN) 100 MG capsule, Take 1 capsule (100 mg total) by mouth at bedtime., Disp: 30 capsule, Rfl: 3   irbesartan (AVAPRO) 75 MG tablet, TAKE 1 TABLET(75 MG) BY MOUTH DAILY, Disp: 90 tablet, Rfl: 1   loratadine (CLARITIN) 10 MG tablet, Take 10 mg by mouth daily., Disp: , Rfl:  Magnesium 400 MG TABS, Take 400 mg by mouth 3 (three) times daily., Disp: , Rfl:    methimazole (TAPAZOLE) 5 MG tablet, Take 0.5 tablets (2.5 mg total) by mouth as directed. Half a tablet Monday through Friday (skip Saturday and Sunday), Disp: 65 tablet, Rfl: 2   pantoprazole (PROTONIX) 40 MG tablet, TAKE 1 TABLET(40 MG) BY MOUTH TWICE DAILY, Disp: 180 tablet, Rfl: 1   RESTASIS 0.05 % ophthalmic emulsion, 1 drop 2 (two) times daily., Disp: , Rfl:    traZODone (DESYREL) 50 MG tablet, Take 0.5-1 tablets (25-50 mg total) by mouth at bedtime as needed for sleep., Disp: 30 tablet, Rfl: 3   fluticasone (FLONASE) 50 MCG/ACT nasal spray, Place 1 spray into both nostrils 2 (two) times daily., Disp: 16 g, Rfl: 0  Current Facility-Administered Medications:    cyanocobalamin ((VITAMIN B-12)) injection 1,000 mcg, 1,000 mcg, Intramuscular, Q30 days, Isaac Bliss, Rayford Halsted, MD, 1,000 mcg at 07/06/22 8295  Review of Systems:   Constitutional: Denies fever, chills, diaphoresis, appetite change. HEENT: Denies photophobia, eye pain, redness, hearing loss, ear pain, congestion, sore throat, rhinorrhea, sneezing, mouth sores, trouble swallowing, neck pain, neck stiffness and tinnitus.   Respiratory: Denies SOB, DOE, cough, chest tightness,  and wheezing.   Cardiovascular: Denies chest pain, palpitations and leg swelling.  Gastrointestinal: Denies nausea, vomiting, abdominal pain, diarrhea, constipation, blood in stool and abdominal distention.  Genitourinary: Denies dysuria, urgency, frequency, hematuria, flank pain and difficulty urinating.  Endocrine: Denies: hot or cold intolerance, sweats, changes in hair or nails, polyuria, polydipsia. Musculoskeletal: Denies myalgias, back pain, joint swelling, arthralgias and gait problem.  Skin: Denies pallor, rash and wound.  Neurological: Denies dizziness, seizures, syncope, weakness, light-headedness, numbness and headaches.  Hematological: Denies adenopathy. Easy bruising, personal or family bleeding history  Psychiatric/Behavioral: Denies suicidal ideation, mood changes, confusion, nervousness and agitation    Physical Exam: Vitals:   07/19/22 1329  BP: 124/84  Pulse: 70  Temp: 97.9 F (36.6 C)  TempSrc: Oral  SpO2: 99%  Weight: 165 lb 8 oz (75.1 kg)    Body mass index is 29.32 kg/m.   Constitutional: NAD, calm, comfortable Eyes: PERRL, lids and conjunctivae normal ENMT: Mucous membranes are moist.   Psychiatric: Normal judgment and insight. Alert and oriented x 3. Normal mood.    Impression and Plan:  Insomnia, unspecified type - Plan: traZODone (DESYREL) 50 MG tablet  Screening for malignant neoplasm of skin - Plan: Ambulatory referral to Dermatology  -We have discussed sleep hygiene in great detail. -She has tried trazodone in the past which has been very useful for her.  We have discussed that it could potentially be habit-forming and can create  drug tolerance.  She agrees to try it short-term.  She will also try to space it out and take melatonin as well. -Requesting referral to dermatology for annual exam as her current dermatologist practice has closed.   Time spent:32 minutes reviewing chart, interviewing and examining patient and formulating plan of care.      Lelon Frohlich, MD [facility]

## 2022-08-05 ENCOUNTER — Ambulatory Visit (INDEPENDENT_AMBULATORY_CARE_PROVIDER_SITE_OTHER): Payer: Medicare Other

## 2022-08-05 DIAGNOSIS — Z23 Encounter for immunization: Secondary | ICD-10-CM | POA: Diagnosis not present

## 2022-08-05 DIAGNOSIS — E538 Deficiency of other specified B group vitamins: Secondary | ICD-10-CM | POA: Diagnosis not present

## 2022-08-05 NOTE — Progress Notes (Signed)
Per orders of Dr. Jerilee Hoh, injection of Cyanocobalamin inj. 1000 mcg/mL given by Encarnacion Slates on Right deltoid.  Patient tolerated injection well.   Patient is scheduled for an injection next month.

## 2022-08-10 ENCOUNTER — Ambulatory Visit: Payer: Medicare Other | Admitting: Internal Medicine

## 2022-08-10 NOTE — Progress Notes (Deleted)
Name: Bethany Cole  MRN/ DOB: [id number], 19-Dec-1950    Age/ Sex: 71 y.o., female     PCP: Isaac Bliss, Rayford Halsted, MD   Reason for Endocrinology Evaluation: Hyperthyroidism     Initial Endocrinology Clinic Visit: 12/03/2020    PATIENT IDENTIFIER: [name] is a 71 y.o., female with a past medical history of HTN, GERD and Hyperthyroidism. She has followed with [facility] since 12/03/2020 for consultative assistance with management of her Hyperthyroidism.    Moved from [place] 04/2020    HISTORICAL SUMMARY:  She has been diagnosed with hyperthyroidism in 11/2019 during routine work up with a TSH of 0.15 uIU/L , FT4 1.5 ng/dL ( 0.7-1.5 ng/dL) and an elevated T3 4.30 pg/mL ( 1.7-3.7) . She had worsening anxiety and tremors at the time , this was attributed to [place]  On her initial visit at our clinic she was on methimazole 2.5 mg daily    No FH of thyroid disease  SUBJECTIVE:     Today (08/10/2022):  [name] is here for hyperthyroidism.   She has been noted with weight gain  Had decreased in activity due to hip pain  Has chronic abdominal pain and changes in BM due to IBS  Denies palpitation  Continues with  hand tremors  Has dry eyes and uses Restasis  Denies local neck symptoms  Anxiety has improving, sleeping better   She uses a CPAP at night   methimazole 5 mg, HALF a tablet Monday through Friday (skip Saturday and Sunday)  HISTORY:  Past Medical History:  Past Medical History:  Diagnosis Date   Cataracts, bilateral    Gastritis    GERD (gastroesophageal reflux disease)    Hypertension    Hyperthyroidism    OSA on CPAP    Squamous cell carcinoma of skin    Past Surgical History:  Past Surgical History:  Procedure Laterality Date   ABDOMINAL HYSTERECTOMY     ENDOMETRIAL ABLATION     HERNIA REPAIR     as an infant   UPPER GASTROINTESTINAL ENDOSCOPY  11/09/2021   Has had sevearal in [place]   WRIST ARTHROSCOPY  Right    Social History:  reports that she has never smoked. She has never used smokeless tobacco. She reports current alcohol use of about 1.0 standard drink of alcohol per week. She reports that she does not use drugs. Family History:  Family History  Problem Relation Age of Onset   Ovarian cancer Mother    Stomach cancer Mother        mets from ovarian cancer   Heart disease Father    Cancer - Other Father        laryngeal cancer   Colon cancer Neg Hx    Esophageal cancer Neg Hx    Pancreatic cancer Neg Hx    Liver disease Neg Hx    Colon polyps Neg Hx    Rectal cancer Neg Hx      HOME MEDICATIONS: Allergies as of 08/10/2022       Reactions   Erythromycin    Macrobid [nitrofurantoin]    Penicillins    Sulfa Antibiotics         Medication List        Accurate as of August 10, 2022  7:30 AM. If you have any questions, ask your nurse or doctor.          acetaminophen 650 MG CR tablet Commonly known as: TYLENOL Take 650 mg by mouth  at bedtime as needed for pain.   benzonatate 100 MG capsule Commonly known as: TESSALON Take 1 capsule (100 mg total) by mouth 2 (two) times daily as needed for cough.   denosumab 60 MG/ML Sosy injection Commonly known as: PROLIA Inject 60 mg into the skin every 6 (six) months. Took in December 2022.   diclofenac Sodium 1 % Gel Commonly known as: VOLTAREN Apply topically 4 (four) times daily.   fluticasone 50 MCG/ACT nasal spray Commonly known as: Flonase Place 1 spray into both nostrils 2 (two) times daily.   gabapentin 100 MG capsule Commonly known as: NEURONTIN Take 1 capsule (100 mg total) by mouth at bedtime.   irbesartan 75 MG tablet Commonly known as: AVAPRO TAKE 1 TABLET(75 MG) BY MOUTH DAILY   loratadine 10 MG tablet Commonly known as: CLARITIN Take 10 mg by mouth daily.   Magnesium 400 MG Tabs Take 400 mg by mouth 3 (three) times daily.   methimazole 5 MG tablet Commonly known as: TAPAZOLE Take 0.5  tablets (2.5 mg total) by mouth as directed. Half a tablet Monday through Friday (skip Saturday and Sunday)   pantoprazole 40 MG tablet Commonly known as: PROTONIX TAKE 1 TABLET(40 MG) BY MOUTH TWICE DAILY   Restasis 0.05 % ophthalmic emulsion Generic drug: cycloSPORINE 1 drop 2 (two) times daily.   traZODone 50 MG tablet Commonly known as: DESYREL Take 0.5-1 tablets (25-50 mg total) by mouth at bedtime as needed for sleep.   VITAMIN B-12 IJ Inject as directed every 30 (thirty) days.          OBJECTIVE:   PHYSICAL EXAM: VS: There were no vitals taken for this visit.   EXAM: General: Pt appears well and is in NAD  Neck: General: Supple without adenopathy. Thyroid: Thyroid size normal.  No goiter or nodules appreciated.   Lungs: Clear with good BS bilat with no rales, rhonchi, or wheezes  Heart: Auscultation: RRR.  Abdomen: Normoactive bowel sounds, soft, nontender, without masses or organomegaly palpable  Extremities:  BL LE: No pretibial edema normal ROM and strength.  Mental Status: Judgment, insight: Intact Mood and affect: No depression, anxiety, or agitation     DATA REVIEWED:  Latest Reference Range & Units 02/10/22 11:11  TSH 0.35 - 5.50 uIU/mL 3.13  T4,Free(Direct) 0.60 - 1.60 ng/dL 0.85     Results for ANNALYSSE, SHOEMAKER (MRN [id number]) as of 04/09/2021 07:59  Ref. Range 12/03/2020 09:39  TRAB Latest Ref Range: <=2.00 IU/L 1.02    ASSESSMENT / PLAN / RECOMMENDATIONS:   Hyperthyroidism :    - This has been attributed to Graves' disease through a prior diagnosis. TRAB here was detectable but not elevated  at 1.02 IU/L but she already has been on methimazole at the time  - No local neck symptoms  - Clinically euthyroid  -TFTs continue to be normal we will reduce dose as below  Medications   Change  Methimazole 5 mg , HALF a tablet Monday through Friday (skip Saturday and Sunday)  F/U in 6 months      Signed electronically by: Mack Guise, MD  [facility]  [facility] [address], [place] [address] Phone: [phone] FAX: [phone]      CC: Isaac Bliss, Rayford Halsted, MD [place] [address] Phone: [phone]  Fax: [phone]   Return to Endocrinology clinic as below: Future Appointments  Date Time Provider [place]  08/10/2022  1:40 PM Coron Rossano, Mammie Lorenzo  Amedeo Kinsman, MD [facility] None  09/06/2022  9:30 AM [facility]SE [facility]  09/07/2022  2:20 PM Danis, Kirke Corin, MD [facility]  10/05/2022  9:00 AM [facility]SE [facility]

## 2022-08-20 DIAGNOSIS — L821 Other seborrheic keratosis: Secondary | ICD-10-CM | POA: Diagnosis not present

## 2022-08-20 DIAGNOSIS — L814 Other melanin hyperpigmentation: Secondary | ICD-10-CM | POA: Diagnosis not present

## 2022-08-20 DIAGNOSIS — D485 Neoplasm of uncertain behavior of skin: Secondary | ICD-10-CM | POA: Diagnosis not present

## 2022-08-20 DIAGNOSIS — L820 Inflamed seborrheic keratosis: Secondary | ICD-10-CM | POA: Diagnosis not present

## 2022-08-20 DIAGNOSIS — D1801 Hemangioma of skin and subcutaneous tissue: Secondary | ICD-10-CM | POA: Diagnosis not present

## 2022-08-20 DIAGNOSIS — L570 Actinic keratosis: Secondary | ICD-10-CM | POA: Diagnosis not present

## 2022-08-20 DIAGNOSIS — L720 Epidermal cyst: Secondary | ICD-10-CM | POA: Diagnosis not present

## 2022-08-20 DIAGNOSIS — D2272 Melanocytic nevi of left lower limb, including hip: Secondary | ICD-10-CM | POA: Diagnosis not present

## 2022-08-20 DIAGNOSIS — Z85828 Personal history of other malignant neoplasm of skin: Secondary | ICD-10-CM | POA: Diagnosis not present

## 2022-08-27 ENCOUNTER — Encounter: Payer: Self-pay | Admitting: Family Medicine

## 2022-08-27 ENCOUNTER — Ambulatory Visit (INDEPENDENT_AMBULATORY_CARE_PROVIDER_SITE_OTHER): Payer: Medicare Other | Admitting: Family Medicine

## 2022-08-27 VITALS — BP 120/80 | HR 62 | Temp 97.8°F | Ht 63.0 in | Wt 163.2 lb

## 2022-08-27 DIAGNOSIS — J0100 Acute maxillary sinusitis, unspecified: Secondary | ICD-10-CM | POA: Diagnosis not present

## 2022-08-27 IMAGING — MR MR HIP*R* W/O CM
4 of 5 series · 28 of 40 positions shown · non-contrast
Comparison: Radiograph 04/29/2021.

CLINICAL DATA: Right hip pain for 4-6 weeks. No known injury or
prior relevant surgery.

EXAM:
MR OF THE RIGHT HIP WITHOUT CONTRAST
TECHNIQUE: Multiplanar, multisequence MR imaging was performed. No intravenous
contrast was administered.

[Series 3: T1 · coronal · 4.0mm · 0.85mm/px · 8 of 36 slices shown]
[im 1/36]
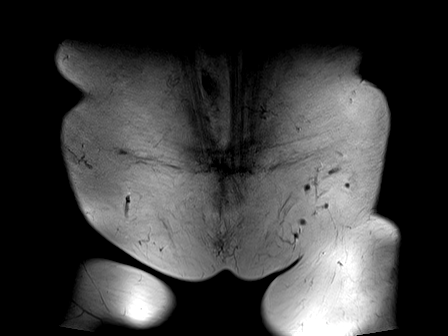
[im 4/36]
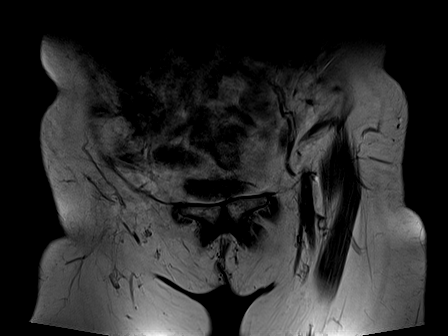
[im 12/36]
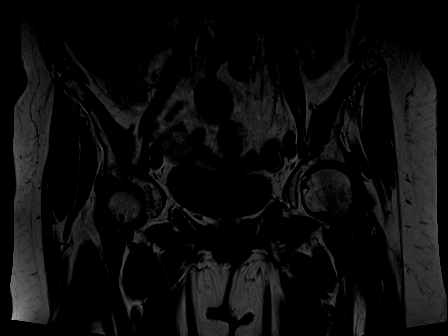
[im 16/36]
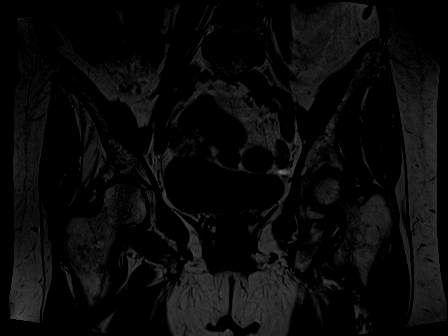
[im 20/36]
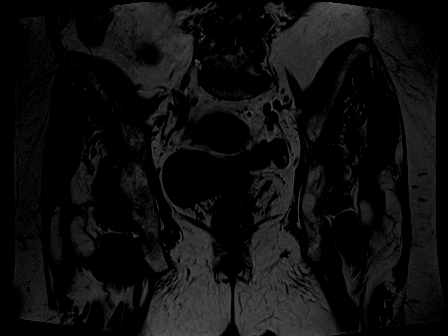
[im 24/36]
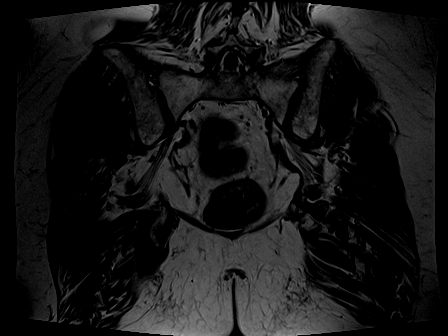
[im 32/36]
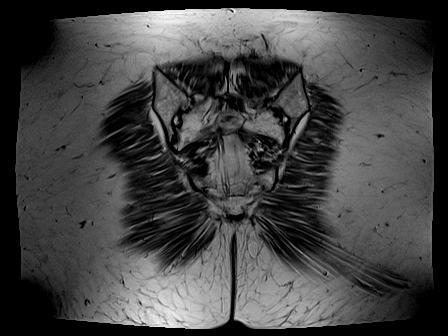
[im 36/36]
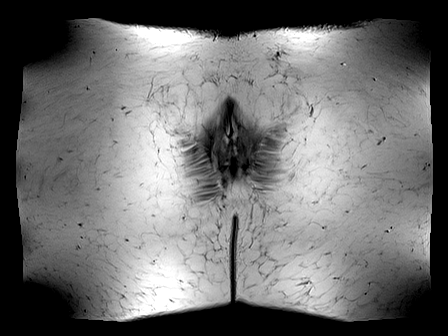

[Series 4: STIR · coronal · 4.0mm · 1.19mm/px · 8 of 36 slices shown]
[im 1/36]
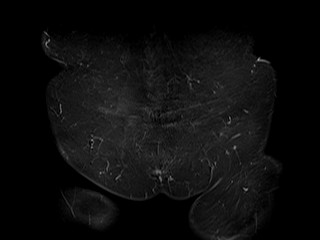
[im 4/36]
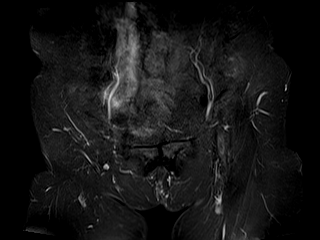
[im 12/36]
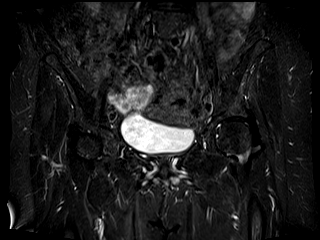
[im 16/36]
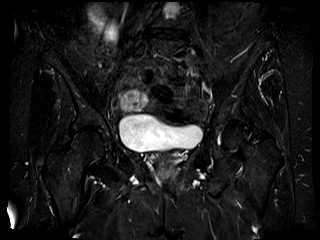
[im 20/36]
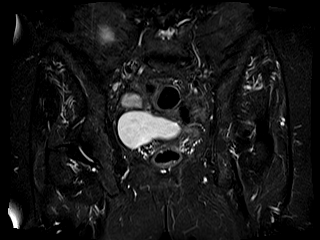
[im 24/36]
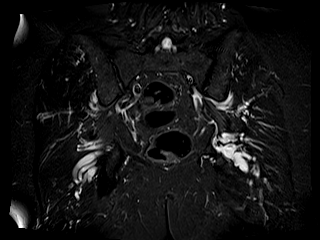
[im 32/36]
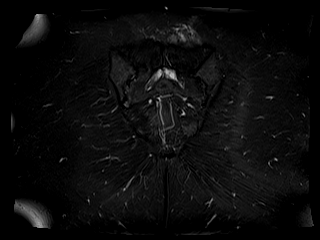
[im 36/36]
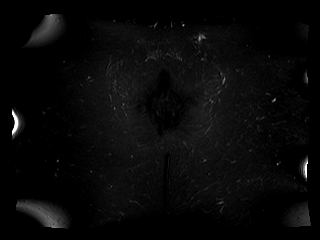

[Series 6: PD fat-sat · sagittal · 4.5mm · 0.35mm/px · 6 of 23 slices shown (1 of 2)]
[im 1/23]
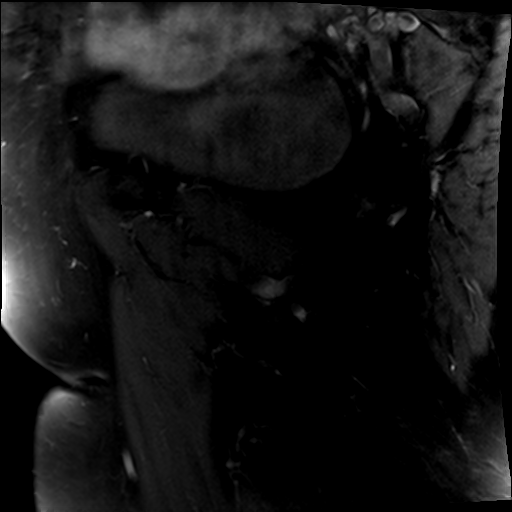
[im 5/23]
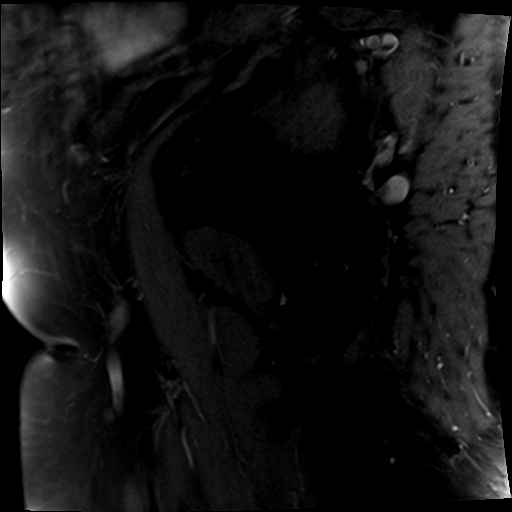
[im 9/23]
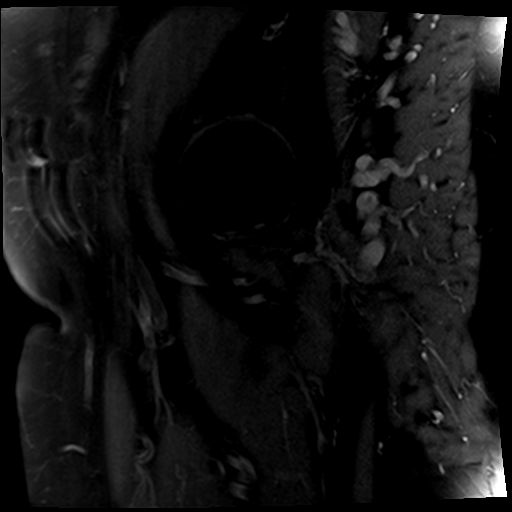
[im 14/23]
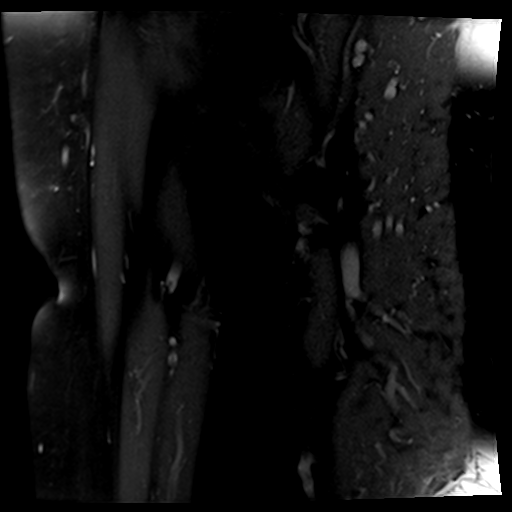
[im 18/23]
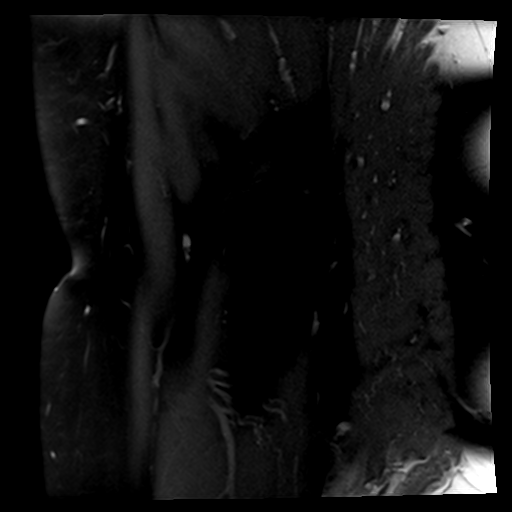
[im 23/23]
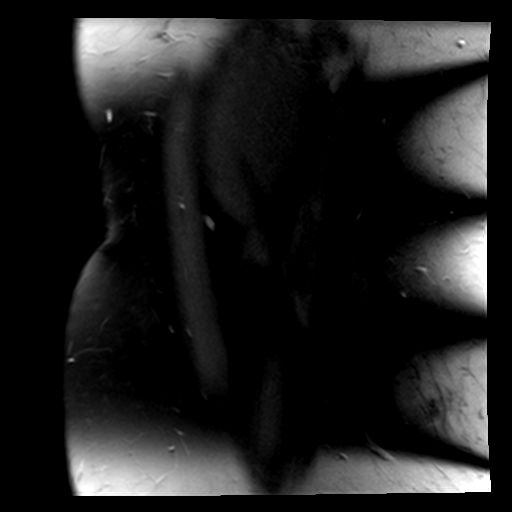

[Series 7: PD fat-sat · coronal · 4.5mm · 0.35mm/px · 6 of 23 slices shown (2 of 2)]
[im 1/23]
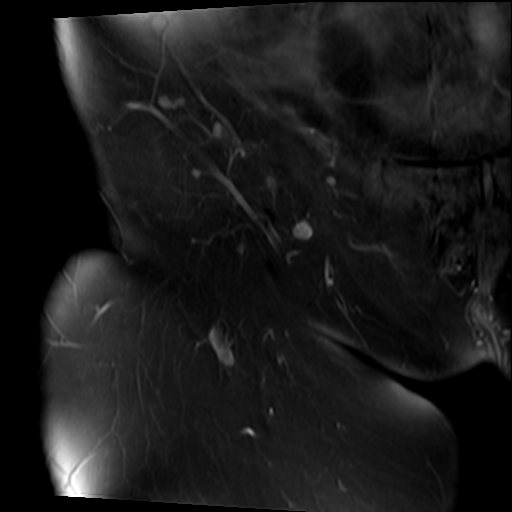
[im 5/23]
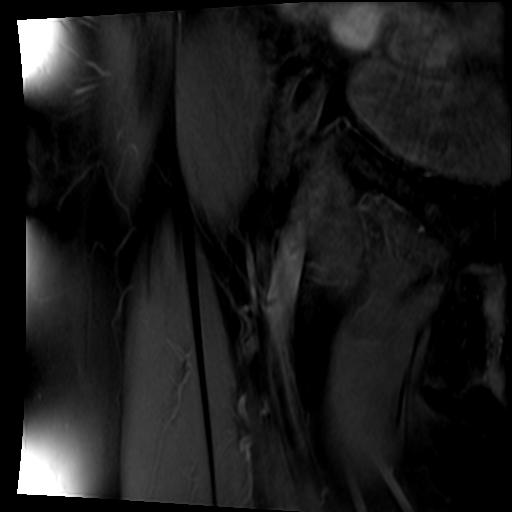
[im 9/23]
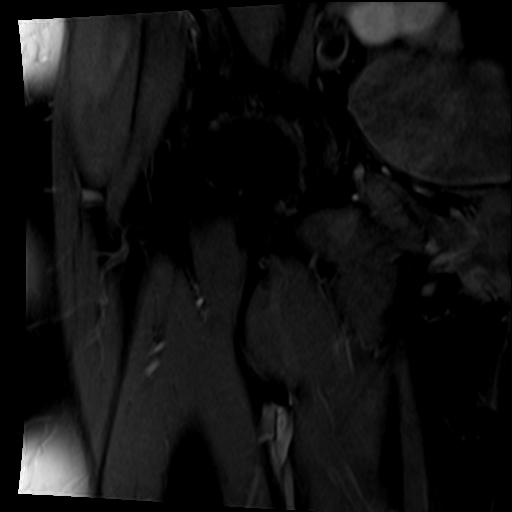
[im 14/23]
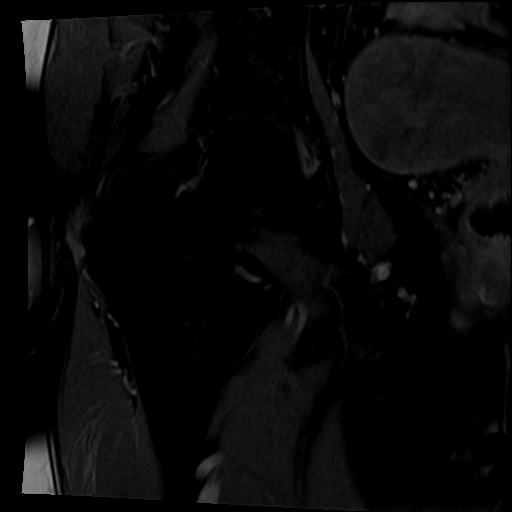
[im 18/23]
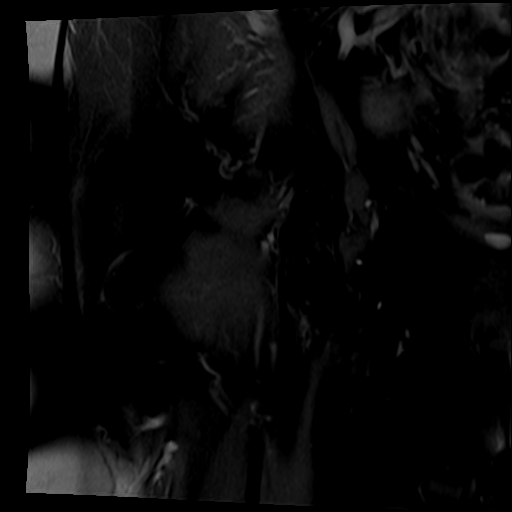
[im 23/23]
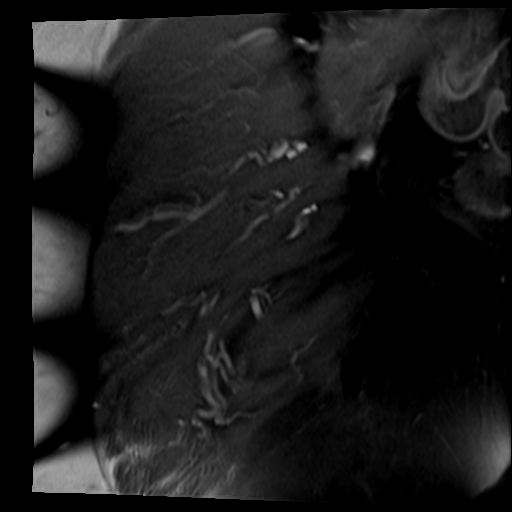

[28 of 40 positions shown; findings below may reference images not displayed]

FINDINGS: Bones: There is no evidence of acute fracture, dislocation or
avascular necrosis. The visualized bony pelvis appears normal. The
visualized sacroiliac joints and symphysis pubis appear normal.
Prominent facet hypertrophy in the lower lumbar spine with
asymmetric involvement of the left L4-5 facet, partially imaged in
the coronal plane.

Articular cartilage and labrum

Articular cartilage: No focal chondral defect or subchondral signal
abnormality identified. Minimal degenerative changes of both hips.

Labrum: There is no gross labral tear or paralabral abnormality.

Joint or bursal effusion

Joint effusion: No significant hip joint effusion.

Bursae: No focal periarticular fluid collection.

Muscles and tendons

Muscles and tendons: Minimal asymmetric gluteus tendinosis on the
right without tear. The common hamstring and iliopsoas tendons
appear intact. The piriformis muscles are symmetric.

Other findings

Miscellaneous: Hysterectomy. The visualized internal pelvic contents
appear unremarkable.
IMPRESSION: 1. No acute findings or clear explanation for the patient's
symptoms. Minimal degenerative changes of the hips.
2. Mild asymmetric right gluteus tendinosis without tear.
3. Lower lumbar facet arthropathy with asymmetric involvement on the
left at L4-5, potentially contributing to low back pain.

## 2022-08-27 MED ORDER — DOXYCYCLINE HYCLATE 100 MG PO TABS: 100.0000 mg | ORAL_TABLET | Freq: Two times a day (BID) | ORAL | 0 refills | Status: DC

## 2022-08-27 NOTE — Progress Notes (Signed)
Established Patient Office Visit  Subjective   Patient ID: Bethany Cole, female    DOB: 1951-09-21  Age: 71 y.o. MRN: [id number]  Chief Complaint  Patient presents with   Sinus Problem    X a few weeks   Cough    X 6 weeks    Patient is having significant sinus pain-- states she has had a cough for about 6-8 weeks, but this has gotten a little better. States that her throat and sinus pain started about 2 1/2 weeks ago. Is having subjective chills but no fever. No chest pain, no SOB, a lot of drainage from her sinuses. States she she have been feeling more fatigued in the afternoon-- has been having to take naps. States she cannot breathe through her nose at all. No ear pain or ear fullness. I reviewed her allergy list.  Sinus Problem This is a new problem. The current episode started more than 1 month ago. There has been no fever. Associated symptoms include congestion, coughing, headaches, sinus pressure and a sore throat. Pertinent negatives include no ear pain. Past treatments include acetaminophen. The treatment provided no relief.  Cough Associated symptoms include headaches and a sore throat. Pertinent negatives include no ear pain.      Review of Systems  HENT:  Positive for congestion, sinus pressure and sore throat. Negative for ear pain.   Respiratory:  Positive for cough.   Neurological:  Positive for headaches.      Objective:     BP 120/80   Pulse 62   Temp 97.8 F (36.6 C) (Oral)   Ht '5\' 3"'$  (1.6 m)   Wt 163 lb 3 oz (74 kg)   SpO2 99%   BMI 28.91 kg/m    Physical Exam Vitals reviewed.  Constitutional:      Appearance: Normal appearance. She is well-groomed and normal weight.  HENT:     Right Ear: Tympanic membrane normal.     Left Ear: Tympanic membrane normal.     Nose: Congestion and rhinorrhea present.     Left Turbinates: Enlarged and swollen.     Right Sinus: Maxillary sinus tenderness present.     Left Sinus: Maxillary sinus tenderness  present.     Mouth/Throat:     Pharynx: No oropharyngeal exudate.  Eyes:     Conjunctiva/sclera: Conjunctivae normal.  Neck:     Thyroid: No thyromegaly.  Cardiovascular:     Rate and Rhythm: Normal rate and regular rhythm.     Pulses: Normal pulses.     Heart sounds: S1 normal and S2 normal.  Pulmonary:     Effort: Pulmonary effort is normal.     Breath sounds: Normal breath sounds and air entry.  Musculoskeletal:     Right lower leg: No edema.     Left lower leg: No edema.  Lymphadenopathy:     Cervical: No cervical adenopathy.  Neurological:     Mental Status: She is alert and oriented to person, place, and time. Mental status is at baseline.     Gait: Gait is intact.  Psychiatric:        Mood and Affect: Mood and affect normal.        Speech: Speech normal.        Behavior: Behavior normal.        Judgment: Judgment normal.      No results found for any visits on 08/27/22.    The ASCVD Risk score (Arnett DK, et al., 2019) failed to  calculate for the following reasons:   Unable to determine if patient is Non-Hispanic African American    Assessment & Plan:   Problem List Items Addressed This Visit       Respiratory   Acute maxillary sinusitis - Primary   Relevant Medications   doxycycline (VIBRA-TABS) 100 MG tablet    No follow-ups on file.    Farrel Conners, MD

## 2022-08-27 NOTE — Assessment & Plan Note (Signed)
Severe pain with palpation of the maxillary sinuses, she has been sick for >2 weeks without any improvement, had 2 negative COVID tests, will treat with doxycline 100 mg BID for 10 days. Advised she take the medication with food to reduce nausea. Follow up as needed if sx worsen or persist.

## 2022-08-28 ENCOUNTER — Telehealth: Payer: Self-pay | Admitting: Internal Medicine

## 2022-08-28 NOTE — Telephone Encounter (Signed)
Phone call from RN with [facility] [employer]. She has severe nausea and vomiting with first dose of doxycycline and couldn't take any more Given treatment for sinusitis and her multiple allergies--will try clindamycin '300mg'$  tid (#15 x 0) Asked nurse to caution to stop medication immediately if any loose stools.

## 2022-08-30 NOTE — Telephone Encounter (Signed)
--  Caller states she received an antibiotic; Doxycycline for sinus infection, and was told if she had any stomach issues to call. She has been sick since this morning. She has c/o vomiting. Aller: PCN Erythromycin, Sulfa and a couple more she doesn't remember. Saw MD in office yesterday. Client Directives: Review all OTC and standing medication orders (includes antibiotics) # Do not call for Meds after-hours

## 2022-09-03 NOTE — Telephone Encounter (Signed)
Prolia VOB initiated via [email or web address]   Last Prolia inj 03/30/22 Next Prolia inj due 09/30/22

## 2022-09-06 ENCOUNTER — Ambulatory Visit (INDEPENDENT_AMBULATORY_CARE_PROVIDER_SITE_OTHER): Payer: Medicare Other

## 2022-09-06 DIAGNOSIS — E538 Deficiency of other specified B group vitamins: Secondary | ICD-10-CM

## 2022-09-06 MED ORDER — CYANOCOBALAMIN 1000 MCG/ML IJ SOLN
1000.0000 ug | Freq: Once | INTRAMUSCULAR | Status: AC
  Administered 2022-09-06: 1000 ug via INTRAMUSCULAR

## 2022-09-06 NOTE — Progress Notes (Signed)
Pt here for monthly B12 injection per Dr Jerilee Hoh.  B12 1072mg given IM right deltoid and pt tolerated injection well.

## 2022-09-07 ENCOUNTER — Ambulatory Visit: Payer: Medicare Other | Admitting: Gastroenterology

## 2022-09-07 ENCOUNTER — Encounter: Payer: Self-pay | Admitting: Gastroenterology

## 2022-09-07 VITALS — BP 128/62 | HR 62 | Ht 63.0 in | Wt 162.0 lb

## 2022-09-07 DIAGNOSIS — R1030 Lower abdominal pain, unspecified: Secondary | ICD-10-CM | POA: Diagnosis not present

## 2022-09-07 DIAGNOSIS — K5909 Other constipation: Secondary | ICD-10-CM | POA: Diagnosis not present

## 2022-09-07 DIAGNOSIS — R140 Abdominal distension (gaseous): Secondary | ICD-10-CM | POA: Diagnosis not present

## 2022-09-07 DIAGNOSIS — K5730 Diverticulosis of large intestine without perforation or abscess without bleeding: Secondary | ICD-10-CM

## 2022-09-07 DIAGNOSIS — K31A19 Gastric intestinal metaplasia without dysplasia, unspecified site: Secondary | ICD-10-CM | POA: Diagnosis not present

## 2022-09-07 NOTE — Progress Notes (Signed)
[facility] progress note:  History: Bethany Cole 09/07/2022  Referring provider: Isaac Bliss, Rayford Halsted, MD  Reason for consult/chief complaint: Constipation (Chronic constipation x 3 months. Patient has lower abd pain after a BM. Patient has bloating. 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  Bethany Cole saw me a couple of times in 2022, most recently in the office November 2022 for constipation and bloating.  Longstanding, functional in nature, treated with diet and various supplements.  She has used MiraLAX occasionally, generally averse to prescription medicines.  She has also been empirically treated for bacterial overgrowth with rifaximin, both in [place] prior to moving here and once with me.  (Most recently with samples since Medicare denied the medicine)  Bethany Cole continues to struggle with constipation, more so in the last few months.  She has lower abdominal pain after bowel movement and abdominal bloating without rectal bleeding. She has made some dietary changes to lose weight, using [employer] online assistance and increasing her vegetable/fiber and water intake. She is straining for bowel movements, typically has a BM every 4 to 5 days. ROS:  Review of Systems Denies chest pain or dyspnea No urinary incontinence  Past Medical History: Past Medical History:  Diagnosis Date   Cataracts, bilateral    Gastritis    GERD (gastroesophageal reflux disease)    Hypertension    Hyperthyroidism    OSA on CPAP    Squamous cell carcinoma of skin      Past Surgical History: Past Surgical History:  Procedure Laterality Date   ABDOMINAL HYSTERECTOMY     ENDOMETRIAL ABLATION     HERNIA REPAIR     as an infant   UPPER GASTROINTESTINAL ENDOSCOPY  11/09/2021   Has had sevearal in [place]   WRIST ARTHROSCOPY Right       Family History: Family History  Problem Relation Age of Onset   Ovarian cancer Mother    Stomach cancer Mother        mets from ovarian cancer   Heart disease Father    Cancer - Other Father        laryngeal cancer   Colon cancer Neg Hx    Esophageal cancer Neg Hx    Pancreatic cancer Neg Hx    Liver disease Neg Hx    Colon polyps Neg Hx    Rectal cancer Neg Hx     Social History: Social History   Socioeconomic History   Marital status: Divorced    Spouse name: Not on file   Number of children: Not on file   Years of education: Not on file   Highest education level: Not on file  Occupational History   Not on file  Tobacco Use   Smoking status: Never   Smokeless tobacco: Never  Vaping Use   Vaping Use: Never used  Substance and Sexual Activity   Alcohol use: Yes    Alcohol/week: 1.0 standard drink of alcohol    Types: 1 Glasses of wine per week    Comment: occasional   Drug use: Never   Sexual activity: Not on file  Other Topics Concern   Not on file  Social History Narrative   Not on file   Social Determinants of Health   Financial Resource Strain: Not on file  Food Insecurity: Not on file  Transportation Needs: Not on file  Physical Activity: Not  on file  Stress: Not on file  Social Connections: Not on file    Allergies: Allergies  Allergen Reactions   Erythromycin    Macrobid [Nitrofurantoin]    Penicillins    Sulfa Antibiotics     Outpatient Meds: Current Outpatient Medications  Medication Sig Dispense Refill   acetaminophen (TYLENOL) 650 MG CR tablet Take 650 mg by mouth at bedtime as needed for pain.     benzonatate (TESSALON) 100 MG capsule Take 1 capsule (100 mg total) by mouth 2 (two) times daily as needed for cough. 20 capsule 0   Cyanocobalamin (VITAMIN B-12 IJ) Inject as directed every 30 (thirty) days.     denosumab (PROLIA) 60 MG/ML SOSY injection Inject 60 mg into the skin every 6 (six) months. Took in December 2022.      diclofenac Sodium (VOLTAREN) 1 % GEL Apply topically 4 (four) times daily.     gabapentin (NEURONTIN) 100 MG capsule Take 1 capsule (100 mg total) by mouth at bedtime. 30 capsule 3   irbesartan (AVAPRO) 75 MG tablet TAKE 1 TABLET(75 MG) BY MOUTH DAILY 90 tablet 1   loratadine (CLARITIN) 10 MG tablet Take 10 mg by mouth daily.     Magnesium 400 MG TABS Take 400 mg by mouth 3 (three) times daily.     methimazole (TAPAZOLE) 5 MG tablet Take 0.5 tablets (2.5 mg total) by mouth as directed. Half a tablet Monday through Friday (skip Saturday and Sunday) 65 tablet 2   pantoprazole (PROTONIX) 40 MG tablet TAKE 1 TABLET(40 MG) BY MOUTH TWICE DAILY 180 tablet 1   RESTASIS 0.05 % ophthalmic emulsion 1 drop 2 (two) times daily.     traZODone (DESYREL) 50 MG tablet Take 0.5-1 tablets (25-50 mg total) by mouth at bedtime as needed for sleep. 30 tablet 3   fluticasone (FLONASE) 50 MCG/ACT nasal spray Place 1 spray into both nostrils 2 (two) times daily. 16 g 0   No current facility-administered medications for this visit.      ___________________________________________________________________ Objective   Exam:  BP 128/62   Pulse 62   Ht '5\' 3"'$  (1.6 m)   Wt 162 lb (73.5 kg)   BMI 28.70 kg/m  Wt Readings from Last 3 Encounters:  09/07/22 162 lb (73.5 kg)  08/27/22 163 lb 3 oz (74 kg)  07/19/22 165 lb 8 oz (75.1 kg)    General: Well-appearing CV: Regular without murmur, no JVD, no peripheral edema Resp: clear to auscultation bilaterally, normal RR and effort noted GI: soft, no tenderness, with active bowel sounds. No guarding or palpable organomegaly noted.   Data:  Gastric biopsies showed intestinal metaplasia (though not in all areas), no H. pylori, no dysplasia.  3-year recall EGD recommended.  See 03/10/2021 office note for summary of previous endoscopic reports that her prior GI practice. Colonoscopy December 2019 was a complete exam with good prep, 3 mm tubular adenoma sigmoid colon.   Melanosis, multiple large and small left-sided diverticuli.  Assessment: Encounter Diagnoses  Name Primary?   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  Perhaps some age-related pelvic floor dysfunction as well.  Plan:  MiraLAX, start 1 capful a day for 3 days.  If no significant relief of constipation, increase to a  capful twice daily.  Once bowels are moving better, reduce dose by 50% and adjust as needed.  If that does not help sufficiently or is limited by side effects, then we have agreed she will try samples of Linzess 72 mcg once daily.  She will contact our office soon with an update on symptoms and we will proceed accordingly.  She could return to the office to pick up any needed samples.  Thank you for the courtesy of this consult.  Please call me with any questions or concerns.  Nelida Meuse III  CC: Referring provider noted above

## 2022-09-07 NOTE — Patient Instructions (Signed)
_______________________________________________________  If you are age 72 or older, your body mass index should be between 23-30. Your Body mass index is 28.7 kg/m. If this is out of the aforementioned range listed, please consider follow up with your Primary Care Provider.  If you are age 42 or younger, your body mass index should be between 19-25. Your Body mass index is 28.7 kg/m. If this is out of the aformentioned range listed, please consider follow up with your Primary Care Provider.   ________________________________________________________  The [facility] providers would like to encourage you to use [facility] to communicate with providers for non-urgent requests or questions.  Due to long hold times on the telephone, sending your provider a message by [facility] may be a faster and more efficient way to get a response.  Please allow 48 business hours for a response.  Please remember that this is for non-urgent requests.  _______________________________________________________  It was a pleasure to see you today!  Thank you for trusting me with your gastrointestinal care!

## 2022-09-08 ENCOUNTER — Other Ambulatory Visit: Payer: Self-pay

## 2022-09-08 ENCOUNTER — Other Ambulatory Visit: Payer: Self-pay | Admitting: Internal Medicine

## 2022-09-08 DIAGNOSIS — G4733 Obstructive sleep apnea (adult) (pediatric): Secondary | ICD-10-CM | POA: Diagnosis not present

## 2022-09-08 DIAGNOSIS — K219 Gastro-esophageal reflux disease without esophagitis: Secondary | ICD-10-CM

## 2022-09-08 DIAGNOSIS — I10 Essential (primary) hypertension: Secondary | ICD-10-CM

## 2022-09-08 MED ORDER — PANTOPRAZOLE SODIUM 40 MG PO TBEC: DELAYED_RELEASE_TABLET | ORAL | 1 refills | Status: DC

## 2022-09-09 ENCOUNTER — Encounter: Payer: Self-pay | Admitting: Internal Medicine

## 2022-09-15 NOTE — Telephone Encounter (Signed)
Prior auth required for PROLIA  PA PROCESS DETAILS: Effective 10/25/2021 if the patient is new to Prolia, Prior authorization and Step Therapy are required & not on file. Please go to [email or web address] or call [phone] to initiate the prior authorization. For exception to the policy please visit [email or web address] and review Policy Number [email or web address] 

## 2022-09-21 ENCOUNTER — Telehealth: Payer: Self-pay | Admitting: Internal Medicine

## 2022-09-21 ENCOUNTER — Encounter: Payer: Self-pay | Admitting: Internal Medicine

## 2022-09-21 ENCOUNTER — Ambulatory Visit (INDEPENDENT_AMBULATORY_CARE_PROVIDER_SITE_OTHER): Payer: Medicare Other | Admitting: Internal Medicine

## 2022-09-21 VITALS — BP 141/81 | HR 76 | Temp 97.8°F | Wt 161.7 lb

## 2022-09-21 DIAGNOSIS — I10 Essential (primary) hypertension: Secondary | ICD-10-CM

## 2022-09-21 DIAGNOSIS — J0100 Acute maxillary sinusitis, unspecified: Secondary | ICD-10-CM | POA: Diagnosis not present

## 2022-09-21 MED ORDER — IRBESARTAN 75 MG PO TABS: ORAL_TABLET | ORAL | 1 refills | Status: DC

## 2022-09-21 MED ORDER — AZITHROMYCIN 250 MG PO TABS: ORAL_TABLET | ORAL | 0 refills | Status: AC

## 2022-09-21 NOTE — Progress Notes (Signed)
Established Patient Office Visit     CC/Reason for Visit: Facial pain, sinus pressure  HPI: Bethany Cole is a 71 y.o. female who is coming in today for the above mentioned reasons.  For the past 2 weeks she has been dealing with both maxillary and frontal sinus pressure, headache, cough productive of yellow sputum, low-grade temperatures and bodyaches.   Past Medical/Surgical History: Past Medical History:  Diagnosis Date   Cataracts, bilateral    Gastritis    GERD (gastroesophageal reflux disease)    Hypertension    Hyperthyroidism    OSA on CPAP    Squamous cell carcinoma of skin     Past Surgical History:  Procedure Laterality Date   ABDOMINAL HYSTERECTOMY     ENDOMETRIAL ABLATION     HERNIA REPAIR     as an infant   UPPER GASTROINTESTINAL ENDOSCOPY  11/09/2021   Has had sevearal in [place]   WRIST ARTHROSCOPY Right     Social History:  reports that she has never smoked. She has never used smokeless tobacco. She reports current alcohol use of about 1.0 standard drink of alcohol per week. She reports that she does not use drugs.  Allergies: Allergies  Allergen Reactions   Doxycycline Nausea And Vomiting   Erythromycin    Macrobid [Nitrofurantoin]    Penicillins    Sulfa Antibiotics     Family History:  Family History  Problem Relation Age of Onset   Ovarian cancer Mother    Stomach cancer Mother        mets from ovarian cancer   Heart disease Father    Cancer - Other Father        laryngeal cancer   Colon cancer Neg Hx    Esophageal cancer Neg Hx    Pancreatic cancer Neg Hx    Liver disease Neg Hx    Colon polyps Neg Hx    Rectal cancer Neg Hx      Current Outpatient Medications:    acetaminophen (TYLENOL) 650 MG CR tablet, Take 650 mg by mouth at bedtime as needed for pain., Disp: , Rfl:    azithromycin (ZITHROMAX) 250 MG tablet, Take 2 tablets on day 1, then 1 tablet daily on days 2 through 5, Disp: 6 tablet, Rfl: 0   Cyanocobalamin  (VITAMIN B-12 IJ), Inject as directed every 30 (thirty) days., Disp: , Rfl:    denosumab (PROLIA) 60 MG/ML SOSY injection, Inject 60 mg into the skin every 6 (six) months. Took in December 2022., Disp: , Rfl:    diclofenac Sodium (VOLTAREN) 1 % GEL, Apply topically 4 (four) times daily., Disp: , Rfl:    loratadine (CLARITIN) 10 MG tablet, Take 10 mg by mouth daily., Disp: , Rfl:    Magnesium 400 MG TABS, Take 400 mg by mouth 3 (three) times daily., Disp: , Rfl:    methimazole (TAPAZOLE) 5 MG tablet, Take 0.5 tablets (2.5 mg total) by mouth as directed. Half a tablet Monday through Friday (skip Saturday and Sunday), Disp: 65 tablet, Rfl: 2   pantoprazole (PROTONIX) 40 MG tablet, TAKE 1 TABLET(40 MG) BY MOUTH TWICE DAILY, Disp: 180 tablet, Rfl: 1   RESTASIS 0.05 % ophthalmic emulsion, 1 drop 2 (two) times daily., Disp: , Rfl:    traZODone (DESYREL) 50 MG tablet, Take 0.5-1 tablets (25-50 mg total) by mouth at bedtime as needed for sleep., Disp: 30 tablet, Rfl: 3   fluticasone (FLONASE) 50 MCG/ACT nasal spray, Place 1 spray into both nostrils  2 (two) times daily., Disp: 16 g, Rfl: 0   irbesartan (AVAPRO) 75 MG tablet, TAKE 1 TABLET(75 MG) BY MOUTH DAILY, Disp: 90 tablet, Rfl: 1  Review of Systems:  Constitutional: Positive for fever, chills, appetite change and fatigue.  HEENT: Denies photophobia, eye pain, redness, hearing loss, mouth sores, trouble swallowing, neck pain, neck stiffness and tinnitus.   Respiratory: Denies SOB, DOE, chest tightness,  and wheezing.   Cardiovascular: Denies chest pain, palpitations and leg swelling.  Gastrointestinal: Denies nausea, vomiting, abdominal pain, diarrhea, constipation, blood in stool and abdominal distention.  Genitourinary: Denies dysuria, urgency, frequency, hematuria, flank pain and difficulty urinating.  Endocrine: Denies: hot or cold intolerance, sweats, changes in hair or nails, polyuria, polydipsia. Musculoskeletal: Denies myalgias, back pain,  joint swelling, arthralgias and gait problem.  Skin: Denies pallor, rash and wound.  Neurological: Denies dizziness, seizures, syncope, weakness, light-headedness, numbness and headaches.  Hematological: Denies adenopathy. Easy bruising, personal or family bleeding history  Psychiatric/Behavioral: Denies suicidal ideation, mood changes, confusion, nervousness, sleep disturbance and agitation    Physical Exam: Vitals:   09/21/22 1301 09/21/22 1304  BP: (!) 140/90 (!) 141/81  Pulse: 76   Temp: 97.8 F (36.6 C)   TempSrc: Oral   SpO2: 98%   Weight: 161 lb 11.2 oz (73.3 kg)     Body mass index is 28.64 kg/m.   Constitutional: NAD, calm, comfortable Eyes: PERRL, lids and conjunctivae normal, wears corrective lenses ENMT: Mucous membranes are moist. Posterior pharynx is erythematous but clear of any exudate or lesions. Normal dentition. Tympanic membrane is pearly white, no erythema or bulging. Respiratory: clear to auscultation bilaterally, no wheezing, no crackles. Normal respiratory effort. No accessory muscle use.  Cardiovascular: Regular rate and rhythm, no murmurs / rubs / gallops. No extremity edema.   Psychiatric: Normal judgment and insight. Alert and oriented x 3. Normal mood.    Impression and Plan:  Acute non-recurrent maxillary sinusitis - Plan: azithromycin (ZITHROMAX) 250 MG tablet  Primary hypertension - Plan: irbesartan (AVAPRO) 75 MG tablet  -With facial pain, low-grade temperatures and duration of illness I believe it is prudent to treat her with antibiotics. -She has a listed allergy to erythromycin but has taken both clindamycin and clarithromycin in the past without any issues. -Have also advised over-the-counter symptomatic management with pain relievers, decongestants and cough suppressants.   Time spent:31 minutes reviewing chart, interviewing and examining patient and formulating plan of care.     Lelon Frohlich, MD [facility] at  [facility]

## 2022-09-21 NOTE — Telephone Encounter (Signed)
Spoke to the pharmacist and the patient has stated that she has a macrolide allergy.  Okay to fill?

## 2022-09-21 NOTE — Telephone Encounter (Signed)
Pt called to say she was prescribed azithromycin (ZITHROMAX) 250 MG tablet today...  Pharmacy told Pt this Rx may contain Penicillin (Macrolides) and this Rx may not be good for her.  Pt is asking MD to confirm.  Pt is also asking if MD should send something else to replace the z-pack?  [id number] [address], [place] AT [address] Phone: [phone]  Fax: [phone]

## 2022-09-22 NOTE — Telephone Encounter (Signed)
Left detailed message on machine for patient with Dr Ledell Noss recommendation. Pharmacy is aware.

## 2022-09-23 NOTE — Telephone Encounter (Signed)
PA# [id number] Valid: 09/23/22-09/24/23

## 2022-09-23 NOTE — Telephone Encounter (Signed)
Pt ready for scheduling on or after 09/30/22  Out-of-pocket cost due at time of visit: $306  Primary: [place] Medicare Adv LPPO Prolia co-insurance: 20% (approximately $276) Admin fee co-insurance: $30  Secondary: n/a Prolia co-insurance:  Admin fee co-insurance:   Deductible: does not apply  Prior Auth: APPROVED PA# [id number] Valid: 09/23/22-09/24/23   ** This summary of benefits is an estimation of the patient's out-of-pocket cost. Exact cost may vary based on individual plan coverage.

## 2022-09-27 ENCOUNTER — Encounter: Payer: Self-pay | Admitting: Gastroenterology

## 2022-09-30 NOTE — Telephone Encounter (Signed)
Left message for patient to call back  

## 2022-10-05 ENCOUNTER — Ambulatory Visit: Payer: Medicare Other

## 2022-10-08 NOTE — Telephone Encounter (Signed)
Pt informed of below.  Nurse visit scheduled 10/12/22.

## 2022-10-12 ENCOUNTER — Ambulatory Visit: Payer: Medicare Other

## 2022-10-12 VITALS — Ht 63.0 in | Wt 170.0 lb

## 2022-10-12 DIAGNOSIS — M810 Age-related osteoporosis without current pathological fracture: Secondary | ICD-10-CM

## 2022-10-12 MED ORDER — DENOSUMAB 60 MG/ML ~~LOC~~ SOSY
60.0000 mg | PREFILLED_SYRINGE | Freq: Once | SUBCUTANEOUS | Status: AC
  Administered 2022-10-12: 60 mg via SUBCUTANEOUS

## 2022-10-12 NOTE — Progress Notes (Signed)
Patient here for nurse visit for Prolia 60 mg. She received [place] injection in her left arm. She tolerated injection well. She return for her next Prolia in June 2024.

## 2022-10-31 NOTE — Telephone Encounter (Signed)
Last Prolia inj 10/12/22 Next Prolia inj due 04/14/23  

## 2022-11-04 ENCOUNTER — Ambulatory Visit (INDEPENDENT_AMBULATORY_CARE_PROVIDER_SITE_OTHER): Payer: Medicare Other

## 2022-11-04 ENCOUNTER — Telehealth: Payer: Self-pay

## 2022-11-04 DIAGNOSIS — E538 Deficiency of other specified B group vitamins: Secondary | ICD-10-CM | POA: Diagnosis not present

## 2022-11-04 MED ORDER — CYANOCOBALAMIN 1000 MCG/ML IJ SOLN
1000.0000 ug | INTRAMUSCULAR | Status: AC
  Administered 2022-11-04: 1000 ug via INTRAMUSCULAR

## 2022-11-04 NOTE — Progress Notes (Signed)
Pt here for monthly B12 injection per Dr Jerilee Hoh  B12 1071mg given IM right deltoid and pt tolerated injection well.  Pt to call to schedule next B12 & CPE.

## 2022-11-04 NOTE — Telephone Encounter (Signed)
Last B12 421 on 02/18/22 Last order for b12 injections noted to be on 08/28/21  PCP notified of above. Verbal order received to continue monthly b12 injections.

## 2022-11-08 ENCOUNTER — Ambulatory Visit: Payer: Medicare Other | Admitting: Internal Medicine

## 2022-11-08 NOTE — Progress Notes (Deleted)
Name: Bethany Cole  MRN/ DOB: [id number], [birth year]    Age/ Sex: 72 y.o., female     PCP: Isaac Bliss, Rayford Halsted, MD   Reason for Endocrinology Evaluation: Hyperthyroidism     Initial Endocrinology Clinic Visit: 12/03/2020    PATIENT IDENTIFIER: Bethany Cole is a 72 y.o., female with a past medical history of HTN, GERD and Hyperthyroidism. She has followed with [facility] since 12/03/2020 for consultative assistance with management of her Hyperthyroidism.    Moved from [place] 04/2020    HISTORICAL SUMMARY:  She has been diagnosed with hyperthyroidism in 11/2019 during routine work up with a TSH of 0.15 uIU/L , FT4 1.5 ng/dL ( 0.7-1.5 ng/dL) and an elevated T3 4.30 pg/mL ( 1.7-3.7) . She had worsening anxiety and tremors at the time , this was attributed to [place]  On her initial visit at our clinic she was on methimazole 2.5 mg daily    No FH of thyroid disease  SUBJECTIVE:     Today (11/08/2022):  Bethany Cole is here for hyperthyroidism.    She follows with GI for chronic constipation She had an eye exam with Dr. Delman Cheadle 07/12/2022 She continues you to use CPAP   She has been noted with weight gain  Had decreased in activity due to hip pain  Has chronic abdominal pain and changes in BM due to IBS  Denies palpitation  Continues with  hand tremors  Has dry eyes and uses Restasis  Denies local neck symptoms  Anxiety has improving, sleeping better     Methimazole 5 mg , HALF a tablet Monday through Friday (skip Saturday and Sunday)  HISTORY:  Past Medical History:  Past Medical History:  Diagnosis Date   Cataracts, bilateral    Gastritis    GERD (gastroesophageal reflux disease)    Hypertension    Hyperthyroidism    OSA on CPAP    Squamous cell carcinoma of skin    Past Surgical History:  Past Surgical History:  Procedure Laterality Date   ABDOMINAL HYSTERECTOMY     ENDOMETRIAL ABLATION     HERNIA REPAIR     as an infant    UPPER GASTROINTESTINAL ENDOSCOPY  11/09/2021   Has had sevearal in [place]   WRIST ARTHROSCOPY Right    Social History:  reports that she has never smoked. She has never used smokeless tobacco. She reports current alcohol use of about 1.0 standard drink of alcohol per week. She reports that she does not use drugs. Family History:  Family History  Problem Relation Age of Onset   Ovarian cancer Mother    Stomach cancer Mother        mets from ovarian cancer   Heart disease Father    Cancer - Other Father        laryngeal cancer   Colon cancer Neg Hx    Esophageal cancer Neg Hx    Pancreatic cancer Neg Hx    Liver disease Neg Hx    Colon polyps Neg Hx    Rectal cancer Neg Hx      HOME MEDICATIONS: Allergies as of 11/08/2022       Reactions   Doxycycline Nausea And Vomiting   Erythromycin    Macrobid [nitrofurantoin]    Penicillins    Sulfa Antibiotics         Medication List        Accurate as of November 08, 2022  7:16 AM. If you have any questions, ask your  nurse or doctor.          acetaminophen 650 MG CR tablet Commonly known as: TYLENOL Take 650 mg by mouth at bedtime as needed for pain.   denosumab 60 MG/ML Sosy injection Commonly known as: PROLIA Inject 60 mg into the skin every 6 (six) months. Took in December 2022.   diclofenac Sodium 1 % Gel Commonly known as: VOLTAREN Apply topically 4 (four) times daily.   fluticasone 50 MCG/ACT nasal spray Commonly known as: Flonase Place 1 spray into both nostrils 2 (two) times daily.   irbesartan 75 MG tablet Commonly known as: AVAPRO TAKE 1 TABLET(75 MG) BY MOUTH DAILY   loratadine 10 MG tablet Commonly known as: CLARITIN Take 10 mg by mouth daily.   Magnesium 400 MG Tabs Take 400 mg by mouth 3 (three) times daily.   methimazole 5 MG tablet Commonly known as: TAPAZOLE Take 0.5 tablets (2.5 mg total) by mouth as directed. Half a tablet Monday through Friday (skip Saturday and Sunday)    pantoprazole 40 MG tablet Commonly known as: PROTONIX TAKE 1 TABLET(40 MG) BY MOUTH TWICE DAILY   Restasis 0.05 % ophthalmic emulsion Generic drug: cycloSPORINE 1 drop 2 (two) times daily.   traZODone 50 MG tablet Commonly known as: DESYREL Take 0.5-1 tablets (25-50 mg total) by mouth at bedtime as needed for sleep.   VITAMIN B-12 IJ Inject as directed every 30 (thirty) days.          OBJECTIVE:   PHYSICAL EXAM: VS: There were no vitals taken for this visit.   EXAM: General: Pt appears well and is in NAD  Neck: General: Supple without adenopathy. Thyroid: Thyroid size normal.  No goiter or nodules appreciated.   Lungs: Clear with good BS bilat with no rales, rhonchi, or wheezes  Heart: Auscultation: RRR.  Abdomen: Normoactive bowel sounds, soft, nontender, without masses or organomegaly palpable  Extremities:  BL LE: No pretibial edema normal ROM and strength.  Mental Status: Judgment, insight: Intact Mood and affect: No depression, anxiety, or agitation     DATA REVIEWED:  Latest Reference Range & Units 02/10/22 11:11  TSH 0.35 - 5.50 uIU/mL 3.13  T4,Free(Direct) 0.60 - 1.60 ng/dL 0.85     Results for GAELYN, [place] (MRN [id number]) as of 04/09/2021 07:59  Ref. Range 12/03/2020 09:39  TRAB Latest Ref Range: <=2.00 IU/L 1.02    ASSESSMENT / PLAN / RECOMMENDATIONS:   Hyperthyroidism :    - This has been attributed to Graves' disease through a prior diagnosis. TRAB here was detectable but not elevated  at 1.02 IU/L but she already has been on methimazole at the time  - No local neck symptoms  - Clinically euthyroid  -TFTs continue to be normal we will reduce dose as below  Medications   Change  Methimazole 5 mg , HALF a tablet Monday through Friday (skip Saturday and Sunday)  F/U in 6 months      Signed electronically by: Mack Guise, MD  [facility]  [facility] [address] [place], [place]  [address] Phone: [phone] FAX: [phone]      CC: Isaac Bliss, Rayford Halsted, MD [place] [address] Phone: [phone]  Fax: [phone]   Return to Endocrinology clinic as below: Future Appointments  Date Time Provider [place]  11/08/2022  9:10 AM Kolston Lacount, Melanie Crazier, MD [facility] None  11/10/2022 11:00 AM Gregor Hams, MD [facility] None

## 2022-11-09 NOTE — Progress Notes (Signed)
I, Peterson Lombard, LAT, ATC acting as a scribe for [name], MD.  Bethany Cole is a 72 y.o. female who presents to [place] at [facility] today for cont'd chronic LBP. Pt's last lumbar ESI's were on 06/18/22 and 05/10/22. Pt was last seen by Dr. Georgina Snell on 05/07/22 and was advised to keep [place] updated after ESI.  Today, patient reports low back has been feeling great, but the radiating pain is just starting to return. 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 Pt is having difficulty sleeping, but is intolerant to the gabapentin and trazodone was not helpful.  She is able to fall asleep but tends to wake up at about 3:00 in the morning and has trouble falling back asleep.  She thinks the pain that she is experiencing may be waking her up.  She has a follow-up appointment with her sleep medicine doctor in March regarding her sleep apnea.  She to uses her CPAP every night and does not think it is a problem.  She tolerates it well..  Dx imaging: 04/25/22 L-spine MRI 10/23/21 L-spine XR 05/03/21 R hip MRI             04/29/21 R hip XR  Pertinent review of systems: No fevers or chills  Relevant historical information: Hypertension.  Sleep apnea.   Exam:  BP (!) 152/88   Pulse 60   Ht '5\' 3"'$  (1.6 m)   Wt 157 lb (71.2 kg)   SpO2 97%   BMI 27.81 kg/m  General: Well Developed, well nourished, and in no acute distress.   MSK: L-spine normal lumbar motion.  Normal gait.    Lab and Radiology Results  EXAM: MRI LUMBAR SPINE WITHOUT CONTRAST   TECHNIQUE: Multiplanar, multisequence MR imaging of the lumbar spine was performed. No intravenous contrast was administered.   COMPARISON:  Radiography 10/23/2021   FINDINGS: Segmentation:  5 lumbar type vertebral bodies.   Alignment:  3-4 mm degenerative anterolisthesis L4-5.   Vertebrae:  No fracture or focal bone lesion.   Conus medullaris and cauda equina: Conus extends  to the L1-2 level. Conus and cauda equina appear normal.   Paraspinal and other soft tissues: Negative   Disc levels:   No abnormality from T11-12 through L2-3.   L3-4: Mild bulging of the disc. Mild facet and ligamentous hypertrophy. Mild canal narrowing but no compressive stenosis. The facet arthritis could contribute to back pain.   L4-5: Chronic facet arthropathy with 3-4 mm of degenerative anterolisthesis which could worsen with standing or flexion. Mild bulging of the disc. Stenosis of both subarticular lateral recesses, particularly severe on the right due to inferior facet osteophytes. Tiny synovial cyst projecting into the foramen on the right. Mild facet joint edema and small effusions. Findings at this level could cause mechanical back pain, referred facet syndrome pain or symptoms of neural compression.   L5-S1: Chronic disc degeneration with loss of disc height. Mild facet osteoarthritis. No compressive narrowing of the canal or foramina.   IMPRESSION: L4-5: Chronic facet arthropathy with 3-4 mm of degenerative anterolisthesis. Stenosis of the lateral recesses that could cause neural compression on either or both sides. This is particularly notable on the right due to inferior facet osteophytes. Small synovial cyst projects into the foramen on the right, without gross affect upon the exiting right L4 nerve. Findings at this level could relate to localized back pain, referred facet syndrome pain, or  radicular pain.   L3-4: Disc bulge. Mild facet hypertrophy. Mild canal narrowing but without visible neural compression. The facet arthritis could be painful.   L5-S1: Chronic disc degeneration with loss of disc height. No compressive stenosis.     Electronically Signed   By: Seiter Chimes M.D.   On: 04/26/2022 13:10 I, [name], personally (independently) visualized and performed the interpretation of the images attached in this note.      Assessment and  Plan: 72 y.o. female with chronic back and intermittently chronic radicular pain down the right leg.  She has had benefit in the past with epidural steroid injections especially for the radicular pain.  We certainly could repeat these if needed although she does not think her symptoms currently are bothersome enough to do so.  Core strengthening I think is going to be very helpful.  We talked about some goals.  Physical therapy would be ideal.  She like to try it on her own and I suggested Pilates.  She is doing tai chi which I think is a good idea as well. She will let me know if this is not sufficient and I will refer to physical therapy. I will order an epidural steroid injection whenever she would like me to.  Right now her pain is not quite bad enough to need it.   Additionally she is having bothersome nighttime pain that is interfering with sleep.  We talked about some management strategies.  Plan for Tylenol arthritis at bedtime, Celebrex at bedtime and if not enough nortriptyline low-dose at bedtime.  I would appreciate insight from sleep medicine doctor when he sees him in March.  PDMP not reviewed this encounter. No orders of the defined types were placed in this encounter.  Meds ordered this encounter  Medications   celecoxib (CELEBREX) 100 MG capsule    Sig: Take 1 capsule (100 mg total) by mouth 2 (two) times daily as needed for moderate pain.    Dispense:  60 capsule    Refill:  1   nortriptyline (PAMELOR) 10 MG capsule    Sig: Take 1 capsule (10 mg total) by mouth at bedtime. For pain    Dispense:  30 capsule    Refill:  1     Discussed warning signs or symptoms. Please see discharge instructions. Patient expresses understanding.   The above documentation has been reviewed and is accurate and complete [name], M.D.  Total encounter time 30 minutes including face-to-face time with the patient and, reviewing past medical record, and charting on the date of service.

## 2022-11-10 ENCOUNTER — Ambulatory Visit: Payer: Medicare Other | Admitting: Family Medicine

## 2022-11-10 VITALS — BP 152/88 | HR 60 | Ht 63.0 in | Wt 157.0 lb

## 2022-11-10 DIAGNOSIS — G4701 Insomnia due to medical condition: Secondary | ICD-10-CM | POA: Diagnosis not present

## 2022-11-10 DIAGNOSIS — M5441 Lumbago with sciatica, right side: Secondary | ICD-10-CM

## 2022-11-10 DIAGNOSIS — M5442 Lumbago with sciatica, left side: Secondary | ICD-10-CM | POA: Diagnosis not present

## 2022-11-10 DIAGNOSIS — G8929 Other chronic pain: Secondary | ICD-10-CM

## 2022-11-10 MED ORDER — CELECOXIB 100 MG PO CAPS: 100.0000 mg | ORAL_CAPSULE | Freq: Two times a day (BID) | ORAL | 1 refills | Status: DC | PRN

## 2022-11-10 MED ORDER — NORTRIPTYLINE HCL 10 MG PO CAPS: 10.0000 mg | ORAL_CAPSULE | Freq: Every day | ORAL | 1 refills | Status: DC

## 2022-11-10 NOTE — Patient Instructions (Signed)
Thank you for coming in today.   Take tylenol arthritis 2 pills at bedtime with celebrex for pain.   Take nortriptyline at bedtime also for pain and sleep.   I recommend Piliates and continued Tai Chi  Consider PT.   Let me know if you want a repeat back injection. That could help.   Let me know how this work.

## 2022-11-15 ENCOUNTER — Encounter: Payer: Self-pay | Admitting: Gastroenterology

## 2022-11-17 DIAGNOSIS — G4733 Obstructive sleep apnea (adult) (pediatric): Secondary | ICD-10-CM | POA: Diagnosis not present

## 2022-11-17 DIAGNOSIS — I10 Essential (primary) hypertension: Secondary | ICD-10-CM | POA: Diagnosis not present

## 2022-11-19 ENCOUNTER — Ambulatory Visit: Payer: Medicare Other | Admitting: Internal Medicine

## 2022-11-19 ENCOUNTER — Encounter: Payer: Self-pay | Admitting: Internal Medicine

## 2022-11-19 VITALS — BP 110/68 | HR 60 | Ht 63.0 in | Wt 155.0 lb

## 2022-11-19 DIAGNOSIS — E0590 Thyrotoxicosis, unspecified without thyrotoxic crisis or storm: Secondary | ICD-10-CM | POA: Diagnosis not present

## 2022-11-19 DIAGNOSIS — E0500 Thyrotoxicosis with diffuse goiter without thyrotoxic crisis or storm: Secondary | ICD-10-CM | POA: Diagnosis not present

## 2022-11-19 LAB — T4, FREE: Free T4: 0.88 ng/dL (ref 0.60–1.60)

## 2022-11-19 LAB — TSH: TSH: 3.48 u[IU]/mL (ref 0.35–5.50)

## 2022-11-19 NOTE — Progress Notes (Signed)
Name: Bethany Cole  MRN/ DOB: [id number], [birth year]    Age/ Sex: 72 y.o., female     PCP: Isaac Bliss, Rayford Halsted, MD   Reason for Endocrinology Evaluation: Hyperthyroidism     Initial Endocrinology Clinic Visit: 12/03/2020    PATIENT IDENTIFIER: Bethany Cole is a 72 y.o., female with a past medical history of HTN, GERD and Hyperthyroidism. She has followed with [facility] since 12/03/2020 for consultative assistance with management of her Hyperthyroidism.    Moved from [place] 04/2020  HISTORICAL SUMMARY:  She has been diagnosed with hyperthyroidism in 11/2019 during routine work up with a TSH of 0.15 uIU/L , FT4 1.5 ng/dL ( 0.7-1.5 ng/dL) and an elevated T3 4.30 pg/mL ( 1.7-3.7) . She had worsening anxiety and tremors at the time , this was attributed to [place]  On her initial visit at our clinic she was on methimazole 2.5 mg daily    No FH of thyroid disease  SUBJECTIVE:     Today (11/19/2022):  Bethany Cole is here for hyperthyroidism.    She continues to follow with GI for chronic constipation She had an eye exam with Dr. Delman Cheadle 07/12/2022, has dry eyes and uses restasis  Uses  a CPAP   She has been noted with weight loss  She has a lot of fatigue over the past few months  She does not sleep well at night average 5.5-6 hours  Started new celebrex for arthritis pain   Denies palpitation  Continues with  hand tremors  Denies local neck symptoms    Methimazole 5 mg , HALF a tablet Monday through Friday (skip Saturday and Sunday)  HISTORY:  Past Medical History:  Past Medical History:  Diagnosis Date   Cataracts, bilateral    Gastritis    GERD (gastroesophageal reflux disease)    Hypertension    Hyperthyroidism    OSA on CPAP    Squamous cell carcinoma of skin    Past Surgical History:  Past Surgical History:  Procedure Laterality Date   ABDOMINAL HYSTERECTOMY     ENDOMETRIAL ABLATION     HERNIA REPAIR     as an infant    UPPER GASTROINTESTINAL ENDOSCOPY  11/09/2021   Has had sevearal in [place]   WRIST ARTHROSCOPY Right    Social History:  reports that she has never smoked. She has never used smokeless tobacco. She reports current alcohol use of about 1.0 standard drink of alcohol per week. She reports that she does not use drugs. Family History:  Family History  Problem Relation Age of Onset   Ovarian cancer Mother    Stomach cancer Mother        mets from ovarian cancer   Heart disease Father    Cancer - Other Father        laryngeal cancer   Colon cancer Neg Hx    Esophageal cancer Neg Hx    Pancreatic cancer Neg Hx    Liver disease Neg Hx    Colon polyps Neg Hx    Rectal cancer Neg Hx      HOME MEDICATIONS: Allergies as of 11/19/2022       Reactions   Doxycycline Nausea And Vomiting   Erythromycin    Macrobid [nitrofurantoin]    Penicillins    Sulfa Antibiotics         Medication List        Accurate as of November 19, 2022 10:08 AM. If you have any questions, ask your  nurse or doctor.          acetaminophen 650 MG CR tablet Commonly known as: TYLENOL Take 650 mg by mouth at bedtime as needed for pain.   celecoxib 100 MG capsule Commonly known as: CeleBREX Take 1 capsule (100 mg total) by mouth 2 (two) times daily as needed for moderate pain.   denosumab 60 MG/ML Sosy injection Commonly known as: PROLIA Inject 60 mg into the skin every 6 (six) months. Took in December 2022.   diclofenac Sodium 1 % Gel Commonly known as: VOLTAREN Apply topically 4 (four) times daily.   fluticasone 50 MCG/ACT nasal spray Commonly known as: Flonase Place 1 spray into both nostrils 2 (two) times daily.   irbesartan 75 MG tablet Commonly known as: AVAPRO TAKE 1 TABLET(75 MG) BY MOUTH DAILY   loratadine 10 MG tablet Commonly known as: CLARITIN Take 10 mg by mouth daily.   Magnesium 400 MG Tabs Take 400 mg by mouth 3 (three) times daily.   methimazole 5 MG tablet Commonly  known as: TAPAZOLE Take 0.5 tablets (2.5 mg total) by mouth as directed. Half a tablet Monday through Friday (skip Saturday and Sunday)   nortriptyline 10 MG capsule Commonly known as: PAMELOR Take 1 capsule (10 mg total) by mouth at bedtime. For pain   pantoprazole 40 MG tablet Commonly known as: PROTONIX TAKE 1 TABLET(40 MG) BY MOUTH TWICE DAILY   Restasis 0.05 % ophthalmic emulsion Generic drug: cycloSPORINE 1 drop 2 (two) times daily.   VITAMIN B-12 IJ Inject as directed every 30 (thirty) days.          OBJECTIVE:   PHYSICAL EXAM: VS: BP 110/68 (BP Location: Right Arm, Patient Position: Sitting, Cuff Size: Small)   Pulse 60   Ht '5\' 3"'$  (1.6 m)   Wt 155 lb (70.3 kg)   SpO2 99%   BMI 27.46 kg/m    EXAM: General: Pt appears well and is in NAD  Neck: General: Supple without adenopathy. Thyroid: Thyroid size normal.  No goiter or nodules appreciated.   Lungs: Clear with good BS bilat with no rales, rhonchi, or wheezes  Heart: Auscultation: RRR.  Abdomen: Normoactive bowel sounds, soft, nontender, without masses or organomegaly palpable  Extremities:  BL LE: No pretibial edema normal ROM and strength.  Mental Status: Judgment, insight: Intact Mood and affect: No depression, anxiety, or agitation     DATA REVIEWED:  Latest Reference Range & Units 11/19/22 10:25  TSH 0.35 - 5.50 uIU/mL 3.48  T4,Free(Direct) 0.60 - 1.60 ng/dL 0.88    Results for MARRA, FRAGA (MRN [id number]) as of 04/09/2021 07:59  Ref. Range 12/03/2020 09:39  TRAB Latest Ref Range: <=2.00 IU/L 1.02    ASSESSMENT / PLAN / RECOMMENDATIONS:   Hyperthyroidism :    - This has been attributed to Graves' disease through a prior diagnosis. TRAB here was detectable but not elevated  at 1.02 IU/L but she already has been on methimazole at the time  - No local neck symptoms  - Clinically euthyroid  -TFTs continue to be normal we will reduce dose as below  Medications   Change  Methimazole 5 mg ,  HALF a tablet Monday through Thursday  (skip Friday, Saturday and Sunday)   2. Graves' Disease:   - No extra thyroidal manifestations of Graves' Disease   F/U in 6 months      Signed electronically by: Mack Guise, MD  [facility]  [address]  [place], [place] [address] Phone: [phone] FAX: [phone]      CC: Isaac Bliss, Rayford Halsted, MD [place] [address] Phone: [phone]  Fax: [phone]   Return to Endocrinology clinic as below: Future Appointments  Date Time Provider [place]  11/19/2022 10:10 AM Forney Kleinpeter, Melanie Crazier, MD [facility] None

## 2022-12-01 ENCOUNTER — Encounter: Payer: Self-pay | Admitting: Gastroenterology

## 2022-12-01 DIAGNOSIS — R197 Diarrhea, unspecified: Secondary | ICD-10-CM

## 2022-12-01 DIAGNOSIS — R194 Change in bowel habit: Secondary | ICD-10-CM

## 2022-12-02 NOTE — Telephone Encounter (Signed)
Sounds most likely to be something that she ate or a stomach virus.  Thise things often last longer in patients who have underlying IBS-like symptoms and she does.  I expect it will resolve.  Low-dose Imodium can be used for the diarrhea, but I recommend at most a tablet twice a day since she tends toward constipation.  If there is nausea, a prescription can be sent for 20 tablets of ondansetron 4 mg  Diarrhea lasts longer than 10 days, let us know and we will do a stool test for C. difficile.  -HD

## 2022-12-06 NOTE — Telephone Encounter (Signed)
C. Diff stool study order in epic. Recommendations sent to patient via MyChart.

## 2022-12-07 ENCOUNTER — Other Ambulatory Visit: Payer: Medicare Other

## 2022-12-07 DIAGNOSIS — R194 Change in bowel habit: Secondary | ICD-10-CM

## 2022-12-07 DIAGNOSIS — R197 Diarrhea, unspecified: Secondary | ICD-10-CM

## 2022-12-09 LAB — CLOSTRIDIUM DIFFICILE BY PCR: Toxigenic C. Difficile by PCR: NEGATIVE

## 2022-12-09 MED ORDER — DICYCLOMINE HCL 10 MG PO CAPS: 10.0000 mg | ORAL_CAPSULE | Freq: Three times a day (TID) | ORAL | 1 refills | Status: AC | PRN

## 2022-12-09 NOTE — Addendum Note (Signed)
Addended by: Yevette Edwards on: 12/09/2022 02:37 PM   Modules accepted: Orders

## 2022-12-09 NOTE — Telephone Encounter (Signed)
Not sure what is going on with her.  Recommendations:    Dicyclomine 10 mg One every 8 hours as needed for cramp and diarrhea Disp#45, RF1  Needs clinic visit with me or APP.  Think I might have something open next week.  - HD

## 2022-12-14 NOTE — Progress Notes (Signed)
[facility] Progress Note  Chief Complaint:  No chief complaint on file.   Subjective  History: I last saw he 09/07/2022. She has chronic functional constipation with bloating that she treats with diet and supplements and occasional Miralax. She was empirically treated for SIBO with Rifaximin in [place] and also once with me in 2022. Afterward I offered her samples of Linzess which she did message [place] on 09/27/22 stating that she wanted to try the Linzess.   She messaged [place] on 12/01/22 complaining of several days of abdominal cramps and diarrhea. I prescribed her some Zofran, did a C difficile test which was negative. She contacted [place] again last week and I prescribed her Dicyclomine and made today's visit.  ***  ROS: Review of Systems   The patient's Past Medical, Family and Social History were reviewed and are on file in the EMR.  Objective:  Med list reviewed  Current Outpatient Medications:    acetaminophen (TYLENOL) 650 MG CR tablet, Take 650 mg by mouth at bedtime as needed for pain., Disp: , Rfl:    celecoxib (CELEBREX) 100 MG capsule, Take 1 capsule (100 mg total) by mouth 2 (two) times daily as needed for moderate pain., Disp: 60 capsule, Rfl: 1   Cyanocobalamin (VITAMIN B-12 IJ), Inject as directed every 30 (thirty) days., Disp: , Rfl:    denosumab (PROLIA) 60 MG/ML SOSY injection, Inject 60 mg into the skin every 6 (six) months. Took in December 2022., Disp: , Rfl:    diclofenac Sodium (VOLTAREN) 1 % GEL, Apply topically 4 (four) times daily., Disp: , Rfl:    dicyclomine (BENTYL) 10 MG capsule, Take 1 capsule (10 mg total) by mouth every 8 (eight) hours as needed (abdominal cramping/diarrhea)., Disp: 45 capsule, Rfl: 1   fluticasone (FLONASE) 50 MCG/ACT nasal spray, Place 1 spray into both nostrils 2 (two) times daily., Disp: 16 g, Rfl: 0   irbesartan (AVAPRO) 75 MG tablet, TAKE 1 TABLET(75 MG) BY MOUTH DAILY, Disp: 90 tablet, Rfl: 1   loratadine (CLARITIN) 10 MG  tablet, Take 10 mg by mouth daily., Disp: , Rfl:    Magnesium 400 MG TABS, Take 400 mg by mouth 3 (three) times daily., Disp: , Rfl:    methimazole (TAPAZOLE) 5 MG tablet, Take 0.5 tablets (2.5 mg total) by mouth as directed. Half a tablet Monday through Friday (skip Saturday and Sunday), Disp: 65 tablet, Rfl: 2   nortriptyline (PAMELOR) 10 MG capsule, Take 1 capsule (10 mg total) by mouth at bedtime. For pain, Disp: 30 capsule, Rfl: 1   pantoprazole (PROTONIX) 40 MG tablet, TAKE 1 TABLET(40 MG) BY MOUTH TWICE DAILY, Disp: 180 tablet, Rfl: 1   RESTASIS 0.05 % ophthalmic emulsion, 1 drop 2 (two) times daily., Disp: , Rfl:   Current Facility-Administered Medications:    cyanocobalamin (VITAMIN B12) injection 1,000 mcg, 1,000 mcg, Intramuscular, Q30 days, Isaac Bliss, Rayford Halsted, MD, 1,000 mcg at 11/04/22 1505   Vital signs in last 24 hrs: There were no vitals filed for this visit. Wt Readings from Last 3 Encounters:  11/19/22 155 lb (70.3 kg)  11/10/22 157 lb (71.2 kg)  10/12/22 170 lb (77.1 kg)     Physical Exam General: well-appearing *** HEENT: sclera anicteric, oral mucosa moist without lesions Neck: supple, no thyromegaly, JVD or lymphadenopathy Cardiac: ***,  no peripheral edema Pulm: clear to auscultation bilaterally, normal RR and effort noted Abdomen: soft, *** tenderness, with active bowel sounds. No guarding or palpable hepatosplenomegaly. Skin: warm and  dry, no jaundice or rash  Labs:   ___________________________________________ Radiologic studies:   ____________________________________________ Other:   _____________________________________________ Assessment & Plan   Assessment: No diagnosis found.  ***   Plan: ***   *** minutes were spent on this encounter (including chart review, history/exam, counseling/coordination of care, and documentation) > 50% of that time was spent on counseling and coordination of care.    I,Alexis Herring,acting as a  Education administrator for [place], MD.,have documented all relevant documentation on the behalf of Doran Stabler, MD,as directed by  Doran Stabler, MD while in the presence of Doran Stabler, MD.   Eugene Gavia

## 2022-12-15 ENCOUNTER — Ambulatory Visit: Payer: Medicare Other | Admitting: Gastroenterology

## 2022-12-15 ENCOUNTER — Encounter: Payer: Self-pay | Admitting: Gastroenterology

## 2022-12-15 VITALS — BP 142/90 | HR 65 | Ht 63.0 in | Wt 151.0 lb

## 2022-12-15 DIAGNOSIS — R140 Abdominal distension (gaseous): Secondary | ICD-10-CM

## 2022-12-15 DIAGNOSIS — R197 Diarrhea, unspecified: Secondary | ICD-10-CM

## 2022-12-15 DIAGNOSIS — K581 Irritable bowel syndrome with constipation: Secondary | ICD-10-CM

## 2022-12-15 NOTE — Patient Instructions (Signed)
_______________________________________________________  If your blood pressure at your visit was 140/90 or greater, please contact your primary care physician to follow up on this.  _______________________________________________________  If you are age 73 or older, your body mass index should be between 23-30. Your Body mass index is 26.75 kg/m. If this is out of the aforementioned range listed, please consider follow up with your Primary Care Provider.  If you are age 49 or younger, your body mass index should be between 19-25. Your Body mass index is 26.75 kg/m. If this is out of the aformentioned range listed, please consider follow up with your Primary Care Provider.   ________________________________________________________  The [facility] providers would like to encourage you to use [facility] to communicate with providers for non-urgent requests or questions.  Due to long hold times on the telephone, sending your provider a message by [facility] may be a faster and more efficient way to get a response.  Please allow 48 business hours for a response.  Please remember that this is for non-urgent requests.  _______________________________________________________  It was a pleasure to see you today!  Thank you for trusting me with your gastrointestinal care!

## 2022-12-16 ENCOUNTER — Ambulatory Visit (INDEPENDENT_AMBULATORY_CARE_PROVIDER_SITE_OTHER): Payer: Medicare Other

## 2022-12-16 DIAGNOSIS — E538 Deficiency of other specified B group vitamins: Secondary | ICD-10-CM | POA: Diagnosis not present

## 2022-12-16 MED ORDER — CYANOCOBALAMIN 1000 MCG/ML IJ SOLN
1000.0000 ug | Freq: Once | INTRAMUSCULAR | Status: AC
  Administered 2022-12-16: 1000 ug via INTRAMUSCULAR

## 2022-12-16 NOTE — Progress Notes (Signed)
Pt here for monthly B12 injection per Dr Jerilee Hoh  B12 103mg given IM on Left Deltoid, and pt tolerated injection well.  Next B12 injection scheduled for 01/14/23 at 10 am.

## 2022-12-21 MED ORDER — METRONIDAZOLE 250 MG PO TABS: 250.0000 mg | ORAL_TABLET | Freq: Three times a day (TID) | ORAL | 0 refills | Status: AC

## 2022-12-21 NOTE — Telephone Encounter (Signed)
Sorry to hear she is still unwell.  My plan at the last visit if this turned out to be the case was as follows:  Metronidazole 250 mg 1 tablet 3 times daily for 10 days Dispense 30 tablets, refill 0  If no improvement as she is approaching the end of that treatment course, then she will need to be scheduled for colonoscopy ASAP.  - HD

## 2022-12-21 NOTE — Addendum Note (Signed)
Addended by: Yevette Edwards on: 12/21/2022 10:51 AM   Modules accepted: Orders

## 2022-12-31 ENCOUNTER — Encounter: Payer: Self-pay | Admitting: Gastroenterology

## 2023-01-06 ENCOUNTER — Other Ambulatory Visit: Payer: Self-pay | Admitting: Family Medicine

## 2023-01-14 ENCOUNTER — Ambulatory Visit (INDEPENDENT_AMBULATORY_CARE_PROVIDER_SITE_OTHER): Payer: Medicare Other | Admitting: *Deleted

## 2023-01-14 DIAGNOSIS — E538 Deficiency of other specified B group vitamins: Secondary | ICD-10-CM | POA: Diagnosis not present

## 2023-01-14 MED ORDER — CYANOCOBALAMIN 1000 MCG/ML IJ SOLN
1000.0000 ug | Freq: Once | INTRAMUSCULAR | Status: AC
  Administered 2023-01-14: 1000 ug via INTRAMUSCULAR

## 2023-01-14 NOTE — Progress Notes (Signed)
Per orders of Dr. Michael, injection of Cyanocobalamin 1000mcg given by Deondrae Mcgrail A. Patient tolerated injection well.  

## 2023-02-01 ENCOUNTER — Encounter: Payer: Self-pay | Admitting: Family Medicine

## 2023-02-01 DIAGNOSIS — G8929 Other chronic pain: Secondary | ICD-10-CM

## 2023-02-07 ENCOUNTER — Encounter: Payer: Self-pay | Admitting: *Deleted

## 2023-02-08 ENCOUNTER — Ambulatory Visit
Admission: RE | Admit: 2023-02-08 | Discharge: 2023-02-08 | Disposition: A | Discharge status: Home / Self Care | Payer: Medicare Other | Source: Ambulatory Visit | Attending: Family Medicine | Admitting: Family Medicine

## 2023-02-08 DIAGNOSIS — M47817 Spondylosis without myelopathy or radiculopathy, lumbosacral region: Secondary | ICD-10-CM | POA: Diagnosis not present

## 2023-02-08 DIAGNOSIS — G8929 Other chronic pain: Secondary | ICD-10-CM

## 2023-02-08 MED ORDER — METHYLPREDNISOLONE ACETATE 40 MG/ML INJ SUSP (RADIOLOG
80.0000 mg | Freq: Once | INTRAMUSCULAR | Status: AC
  Administered 2023-02-08: 80 mg via EPIDURAL

## 2023-02-08 MED ORDER — IOPAMIDOL (ISOVUE-M 200) INJECTION 41%
1.0000 mL | Freq: Once | INTRAMUSCULAR | Status: AC
  Administered 2023-02-08: 1 mL via EPIDURAL

## 2023-02-08 NOTE — Discharge Instructions (Signed)

## 2023-02-14 ENCOUNTER — Ambulatory Visit (INDEPENDENT_AMBULATORY_CARE_PROVIDER_SITE_OTHER): Payer: Medicare Other

## 2023-02-14 DIAGNOSIS — E538 Deficiency of other specified B group vitamins: Secondary | ICD-10-CM | POA: Diagnosis not present

## 2023-02-14 MED ORDER — CYANOCOBALAMIN 1000 MCG/ML IJ SOLN
1000.0000 ug | Freq: Once | INTRAMUSCULAR | Status: AC
  Administered 2023-02-14: 1000 ug via INTRAMUSCULAR

## 2023-02-14 NOTE — Progress Notes (Signed)
Per orders of Dr. Hernandez, injection of Cyanocobalamin 1000 mcg given by Ferdie Bakken L Estefanie Cornforth. Patient tolerated injection well.  

## 2023-02-15 DIAGNOSIS — G4733 Obstructive sleep apnea (adult) (pediatric): Secondary | ICD-10-CM | POA: Diagnosis not present

## 2023-02-15 DIAGNOSIS — I10 Essential (primary) hypertension: Secondary | ICD-10-CM | POA: Diagnosis not present

## 2023-02-18 ENCOUNTER — Ambulatory Visit (INDEPENDENT_AMBULATORY_CARE_PROVIDER_SITE_OTHER): Payer: Medicare Other | Admitting: Family Medicine

## 2023-02-18 VITALS — BP 134/70 | Ht 64.0 in | Wt 146.0 lb

## 2023-02-18 DIAGNOSIS — G8929 Other chronic pain: Secondary | ICD-10-CM

## 2023-02-18 DIAGNOSIS — M79671 Pain in right foot: Secondary | ICD-10-CM | POA: Diagnosis not present

## 2023-02-18 DIAGNOSIS — M79672 Pain in left foot: Secondary | ICD-10-CM | POA: Diagnosis not present

## 2023-02-18 NOTE — Progress Notes (Signed)
  Bethany Cole - 72 y.o. female MRN [id number]  Date of birth: 06-06-1951  SUBJECTIVE:  Including CC & ROS.  No chief complaint on file.   Bethany Cole is a 72 y.o. female that is here for orthotics.  She has done well with her previous orthotics.    Review of Systems See HPI   HISTORY: Past Medical, Surgical, Social, and Family History Reviewed & Updated per EMR.   Pertinent Historical Findings include:  Past Medical History:  Diagnosis Date   Cataracts, bilateral    Gastritis    GERD (gastroesophageal reflux disease)    Hypertension    Hyperthyroidism    OSA on CPAP    Squamous cell carcinoma of skin     Past Surgical History:  Procedure Laterality Date   ABDOMINAL HYSTERECTOMY     ENDOMETRIAL ABLATION     HERNIA REPAIR     as an infant   UPPER GASTROINTESTINAL ENDOSCOPY  11/09/2021   Has had sevearal in [place]   WRIST ARTHROSCOPY Right      PHYSICAL EXAM:  VS: BP 134/70   Ht 5\' 4"  (1.626 m)   Wt 146 lb (66.2 kg)   BMI 25.06 kg/m  Physical Exam Gen: NAD, alert, cooperative with exam, well-appearing MSK:  Neurovascularly intact    Patient was fitted for a standard, cushioned, semi-rigid orthotic. The orthotic was heated and afterward the patient stood on the orthotic blank positioned on the orthotic stand. The patient was positioned in subtalar neutral position and 10 degrees of ankle dorsiflexion in a weight bearing stance. After completion of molding, a stable base was applied to the orthotic blank. The blank was ground to a stable position for weight bearing. Size: 6 Pairs: 2 Base: Blue EVA Additional Posting and Padding: right heel lift The patient ambulated these, and they were very comfortable.  ASSESSMENT & PLAN:   Chronic pain of both feet Completed orthotics.

## 2023-02-18 NOTE — Assessment & Plan Note (Signed)
Completed orthotics.  ?

## 2023-02-22 ENCOUNTER — Ambulatory Visit: Payer: Medicare Other | Admitting: Physician Assistant

## 2023-02-26 NOTE — Telephone Encounter (Signed)
Prolia VOB initiated via [email or web address]  Last Prolia inj 10/12/22 Next Prolia inj due 04/14/23

## 2023-02-28 DIAGNOSIS — Z961 Presence of intraocular lens: Secondary | ICD-10-CM | POA: Diagnosis not present

## 2023-02-28 DIAGNOSIS — H3500 Unspecified background retinopathy: Secondary | ICD-10-CM | POA: Diagnosis not present

## 2023-02-28 DIAGNOSIS — H31003 Unspecified chorioretinal scars, bilateral: Secondary | ICD-10-CM | POA: Diagnosis not present

## 2023-02-28 DIAGNOSIS — H524 Presbyopia: Secondary | ICD-10-CM | POA: Diagnosis not present

## 2023-02-28 DIAGNOSIS — H53002 Unspecified amblyopia, left eye: Secondary | ICD-10-CM | POA: Diagnosis not present

## 2023-03-01 ENCOUNTER — Encounter: Payer: Self-pay | Admitting: Podiatry

## 2023-03-01 ENCOUNTER — Ambulatory Visit: Payer: Medicare Other | Admitting: Podiatry

## 2023-03-01 DIAGNOSIS — B351 Tinea unguium: Secondary | ICD-10-CM

## 2023-03-01 DIAGNOSIS — L603 Nail dystrophy: Secondary | ICD-10-CM | POA: Diagnosis not present

## 2023-03-01 DIAGNOSIS — M79676 Pain in unspecified toe(s): Secondary | ICD-10-CM

## 2023-03-02 NOTE — Progress Notes (Signed)
Subjective:  Patient ID: Bethany Cole, female    DOB: 1951/08/13,  MRN: [id number] HPI Chief Complaint  Patient presents with   Nail Problem    Toenails bilateral - thick, dark nails x 2 months, can't cut nails herself - too hard, no treatment   New Patient (Initial Visit)    72 y.o. female presents with the above complaint.   ROS: Denies fever chills nausea vomit muscle aches pains calf pain back pain chest pain shortness of breath.  Past Medical History:  Diagnosis Date   Cataracts, bilateral    Gastritis    GERD (gastroesophageal reflux disease)    Hypertension    Hyperthyroidism    OSA on CPAP    Squamous cell carcinoma of skin    Past Surgical History:  Procedure Laterality Date   ABDOMINAL HYSTERECTOMY     ENDOMETRIAL ABLATION     HERNIA REPAIR     as an infant   UPPER GASTROINTESTINAL ENDOSCOPY  11/09/2021   Has had sevearal in [place]   WRIST ARTHROSCOPY Right     Current Outpatient Medications:    acetaminophen (TYLENOL) 650 MG CR tablet, Take 650 mg by mouth at bedtime as needed for pain., Disp: , Rfl:    celecoxib (CELEBREX) 100 MG capsule, TAKE 1 CAPSULE(100 MG) BY MOUTH TWICE DAILY AS NEEDED FOR MODERATE PAIN, Disp: 60 capsule, Rfl: 1   Cyanocobalamin (VITAMIN B-12 IJ), Inject as directed every 30 (thirty) days., Disp: , Rfl:    denosumab (PROLIA) 60 MG/ML SOSY injection, Inject 60 mg into the skin every 6 (six) months. Took in December 2022., Disp: , Rfl:    diclofenac Sodium (VOLTAREN) 1 % GEL, Apply topically 4 (four) times daily., Disp: , Rfl:    dicyclomine (BENTYL) 10 MG capsule, Take 1 capsule (10 mg total) by mouth every 8 (eight) hours as needed (abdominal cramping/diarrhea)., Disp: 45 capsule, Rfl: 1   fluticasone (FLONASE) 50 MCG/ACT nasal spray, Place 1 spray into both nostrils 2 (two) times daily., Disp: 16 g, Rfl: 0   irbesartan (AVAPRO) 75 MG tablet, TAKE 1 TABLET(75 MG) BY MOUTH DAILY, Disp: 90 tablet, Rfl: 1   loratadine (CLARITIN) 10 MG  tablet, Take 10 mg by mouth daily., Disp: , Rfl:    Magnesium 400 MG TABS, Take 400 mg by mouth 3 (three) times daily., Disp: , Rfl:    methimazole (TAPAZOLE) 5 MG tablet, Take 0.5 tablets (2.5 mg total) by mouth as directed. Half a tablet Monday through Friday (skip Saturday and Sunday), Disp: 65 tablet, Rfl: 2   nortriptyline (PAMELOR) 10 MG capsule, Take 1 capsule (10 mg total) by mouth at bedtime. For pain, Disp: 30 capsule, Rfl: 1   pantoprazole (PROTONIX) 40 MG tablet, TAKE 1 TABLET(40 MG) BY MOUTH TWICE DAILY, Disp: 180 tablet, Rfl: 1   RESTASIS 0.05 % ophthalmic emulsion, 1 drop 2 (two) times daily., Disp: , Rfl:   Allergies  Allergen Reactions   Doxycycline Nausea And Vomiting   Erythromycin    Macrobid [Nitrofurantoin]    Penicillins    Sulfa Antibiotics    Review of Systems Objective:  There were no vitals filed for this visit.  General: Well developed, nourished, in no acute distress, alert and oriented x3   Dermatological: Skin is warm, dry and supple bilateral. Nails x 10 are thick yellow dystrophic not necessarily mycotic; remaining integument appears unremarkable at this time. There are no open sores, no preulcerative lesions, no rash or signs of infection present.  Vascular: Dorsalis Pedis  artery and Posterior Tibial artery pedal pulses are 2/4 bilateral with immedate capillary fill time. Pedal hair growth present. No varicosities and no lower extremity edema present bilateral.   Neruologic: Grossly intact via light touch bilateral. Vibratory intact via tuning fork bilateral. Protective threshold with Semmes Wienstein monofilament intact to all pedal sites bilateral. Patellar and Achilles deep tendon reflexes 2+ bilateral. No Babinski or clonus noted bilateral.   Musculoskeletal: No gross boney pedal deformities bilateral. No pain, crepitus, or limitation noted with foot and ankle range of motion bilateral. Muscular strength 5/5 in all groups tested bilateral.  Gait:  Unassisted, Nonantalgic.    Radiographs:  None taken  Assessment & Plan:   Assessment: Nail dystrophy with pain  Plan: Debridement of toenails 1 through 5 bilateral.     Darsh Vandevoort T. [place], [place]

## 2023-03-03 DIAGNOSIS — H35371 Puckering of macula, right eye: Secondary | ICD-10-CM | POA: Diagnosis not present

## 2023-03-03 DIAGNOSIS — H35412 Lattice degeneration of retina, left eye: Secondary | ICD-10-CM | POA: Diagnosis not present

## 2023-03-03 DIAGNOSIS — H35361 Drusen (degenerative) of macula, right eye: Secondary | ICD-10-CM | POA: Diagnosis not present

## 2023-03-03 DIAGNOSIS — H43811 Vitreous degeneration, right eye: Secondary | ICD-10-CM | POA: Diagnosis not present

## 2023-03-04 ENCOUNTER — Other Ambulatory Visit: Payer: Self-pay | Admitting: Gastroenterology

## 2023-03-04 DIAGNOSIS — K219 Gastro-esophageal reflux disease without esophagitis: Secondary | ICD-10-CM

## 2023-03-08 ENCOUNTER — Encounter: Payer: Self-pay | Admitting: Internal Medicine

## 2023-03-09 ENCOUNTER — Encounter: Payer: Self-pay | Admitting: Internal Medicine

## 2023-03-09 ENCOUNTER — Ambulatory Visit (INDEPENDENT_AMBULATORY_CARE_PROVIDER_SITE_OTHER): Payer: Medicare Other | Admitting: Internal Medicine

## 2023-03-09 VITALS — BP 130/80 | HR 60 | Temp 97.4°F | Ht 64.0 in | Wt 148.7 lb

## 2023-03-09 DIAGNOSIS — G4733 Obstructive sleep apnea (adult) (pediatric): Secondary | ICD-10-CM

## 2023-03-09 DIAGNOSIS — Z0000 Encounter for general adult medical examination without abnormal findings: Secondary | ICD-10-CM | POA: Diagnosis not present

## 2023-03-09 DIAGNOSIS — E538 Deficiency of other specified B group vitamins: Secondary | ICD-10-CM

## 2023-03-09 DIAGNOSIS — K219 Gastro-esophageal reflux disease without esophagitis: Secondary | ICD-10-CM

## 2023-03-09 DIAGNOSIS — M810 Age-related osteoporosis without current pathological fracture: Secondary | ICD-10-CM | POA: Diagnosis not present

## 2023-03-09 DIAGNOSIS — E785 Hyperlipidemia, unspecified: Secondary | ICD-10-CM

## 2023-03-09 DIAGNOSIS — G4700 Insomnia, unspecified: Secondary | ICD-10-CM | POA: Diagnosis not present

## 2023-03-09 DIAGNOSIS — E0590 Thyrotoxicosis, unspecified without thyrotoxic crisis or storm: Secondary | ICD-10-CM | POA: Diagnosis not present

## 2023-03-09 DIAGNOSIS — I10 Essential (primary) hypertension: Secondary | ICD-10-CM

## 2023-03-09 DIAGNOSIS — Z780 Asymptomatic menopausal state: Secondary | ICD-10-CM | POA: Diagnosis not present

## 2023-03-09 LAB — CBC WITH DIFFERENTIAL/PLATELET
Basophils Absolute: 0 10*3/uL (ref 0.0–0.1)
Basophils Relative: 0.6 % (ref 0.0–3.0)
Eosinophils Absolute: 0.1 10*3/uL (ref 0.0–0.7)
Eosinophils Relative: 2.1 % (ref 0.0–5.0)
HCT: 36.6 % (ref 36.0–46.0)
Hemoglobin: 12.3 g/dL (ref 12.0–15.0)
Lymphocytes Relative: 24.4 % (ref 12.0–46.0)
Lymphs Abs: 1.7 10*3/uL (ref 0.7–4.0)
MCHC: 33.6 g/dL (ref 30.0–36.0)
MCV: 90.2 fl (ref 78.0–100.0)
Monocytes Absolute: 0.5 10*3/uL (ref 0.1–1.0)
Monocytes Relative: 7 % (ref 3.0–12.0)
Neutro Abs: 4.5 10*3/uL (ref 1.4–7.7)
Neutrophils Relative %: 65.9 % (ref 43.0–77.0)
Platelets: 247 10*3/uL (ref 150.0–400.0)
RBC: 4.06 Mil/uL (ref 3.87–5.11)
RDW: 16.4 % — ABNORMAL HIGH (ref 11.5–15.5)
WBC: 6.8 10*3/uL (ref 4.0–10.5)

## 2023-03-09 LAB — COMPREHENSIVE METABOLIC PANEL
ALT: 14 U/L (ref 0–35)
AST: 19 U/L (ref 0–37)
Albumin: 4.2 g/dL (ref 3.5–5.2)
Alkaline Phosphatase: 40 U/L (ref 39–117)
BUN: 14 mg/dL (ref 6–23)
CO2: 28 mEq/L (ref 19–32)
Calcium: 9.3 mg/dL (ref 8.4–10.5)
Chloride: 97 mEq/L (ref 96–112)
Creatinine, Ser: 0.72 mg/dL (ref 0.40–1.20)
GFR: 83.02 mL/min (ref >60.00)
Glucose, Bld: 90 mg/dL (ref 70–99)
Potassium: 4.1 mEq/L (ref 3.5–5.1)
Sodium: 132 mEq/L — ABNORMAL LOW (ref 135–145)
Total Bilirubin: 0.4 mg/dL (ref 0.2–1.2)
Total Protein: 6.7 g/dL (ref 6.0–8.3)

## 2023-03-09 LAB — LIPID PANEL
Cholesterol: 223 mg/dL — ABNORMAL HIGH (ref 0–200)
HDL: 93.1 mg/dL (ref >39.00)
LDL Cholesterol: 120 mg/dL — ABNORMAL HIGH (ref 0–99)
NonHDL: 129.99
Total CHOL/HDL Ratio: 2
Triglycerides: 52 mg/dL (ref 0.0–149.0)
VLDL: 10.4 mg/dL (ref 0.0–40.0)

## 2023-03-09 LAB — VITAMIN B12: Vitamin B-12: 331 pg/mL (ref 211–911)

## 2023-03-09 LAB — VITAMIN D 25 HYDROXY (VIT D DEFICIENCY, FRACTURES): VITD: 54.73 ng/mL (ref 30.00–100.00)

## 2023-03-09 LAB — TSH: TSH: 2.28 u[IU]/mL (ref 0.35–5.50)

## 2023-03-09 NOTE — Progress Notes (Signed)
Established Patient Office Visit     CC/Reason for Visit: Annual preventive exam, subsequent Medicare wellness visit and discuss acute concern  HPI: Bethany Cole is a 72 y.o. female who is coming in today for the above mentioned reasons. Past Medical History is significant for: Hypertension, hyperlipidemia, hypothyroidism, osteoporosis, GERD, OSA on CPAP.  She has routine eye and dental care, no perceived hearing deficits although she does wear hearing aids, she exercises by walking.  She is due for an RSV vaccine.  She will be due for her 5-year colonoscopy this year and is already followed by Dr. Myrtie Neither.  Due for DEXA scan.  She has had a hysterectomy and no longer does Pap smears.  She has been having issues with sleep.  She states she will sleep 4-1/2 to 5 hours every night but does not feel well rested.  On days when she sleeps 7 hours she feels improved.  She does use her CPAP every night without fail.   Past Medical/Surgical History: Past Medical History:  Diagnosis Date   Allergy    Anemia    Anxiety    Arthritis    Cataracts, bilateral    Gastritis    GERD (gastroesophageal reflux disease)    Heart murmur    Hypertension    Hyperthyroidism    OSA on CPAP    Sleep apnea    Squamous cell carcinoma of skin     Past Surgical History:  Procedure Laterality Date   ABDOMINAL HYSTERECTOMY     ENDOMETRIAL ABLATION     EYE SURGERY     HERNIA REPAIR     as an infant   UPPER GASTROINTESTINAL ENDOSCOPY  11/09/2021   Has had sevearal in [place]   WRIST ARTHROSCOPY Right     Social History:  reports that she has never smoked. She has never used smokeless tobacco. She reports current alcohol use of about 1.0 standard drink of alcohol per week. She reports that she does not use drugs.  Allergies: Allergies  Allergen Reactions   Doxycycline Nausea And Vomiting   Erythromycin    Macrobid [Nitrofurantoin]    Penicillins    Sulfa Antibiotics     Family History:   Family History  Problem Relation Age of Onset   Ovarian cancer Mother    Stomach cancer Mother        mets from ovarian cancer   Arthritis Mother    Cancer Mother    Hearing loss Mother    Hypertension Mother    Heart disease Father    Cancer - Other Father        laryngeal cancer   Alcohol abuse Father    Arthritis Father    Hyperlipidemia Father    Hypertension Father    Hyperlipidemia Brother    Colon cancer Neg Hx    Esophageal cancer Neg Hx    Pancreatic cancer Neg Hx    Liver disease Neg Hx    Colon polyps Neg Hx    Rectal cancer Neg Hx      Current Outpatient Medications:    acetaminophen (TYLENOL) 650 MG CR tablet, Take 650 mg by mouth at bedtime as needed for pain., Disp: , Rfl:    celecoxib (CELEBREX) 100 MG capsule, TAKE 1 CAPSULE(100 MG) BY MOUTH TWICE DAILY AS NEEDED FOR MODERATE PAIN, Disp: 60 capsule, Rfl: 1   Cyanocobalamin (VITAMIN B-12 IJ), Inject as directed every 30 (thirty) days., Disp: , Rfl:    denosumab (PROLIA) 60 MG/ML  SOSY injection, Inject 60 mg into the skin every 6 (six) months. Took in December 2022., Disp: , Rfl:    diclofenac Sodium (VOLTAREN) 1 % GEL, Apply topically 4 (four) times daily., Disp: , Rfl:    dicyclomine (BENTYL) 10 MG capsule, Take 1 capsule (10 mg total) by mouth every 8 (eight) hours as needed (abdominal cramping/diarrhea)., Disp: 45 capsule, Rfl: 1   fluticasone (FLONASE) 50 MCG/ACT nasal spray, Place 1 spray into both nostrils 2 (two) times daily., Disp: 16 g, Rfl: 0   irbesartan (AVAPRO) 75 MG tablet, TAKE 1 TABLET(75 MG) BY MOUTH DAILY, Disp: 90 tablet, Rfl: 1   Magnesium 400 MG TABS, Take 400 mg by mouth 3 (three) times daily., Disp: , Rfl:    methimazole (TAPAZOLE) 5 MG tablet, Take 0.5 tablets (2.5 mg total) by mouth as directed. Half a tablet Monday through Friday (skip Saturday and Sunday), Disp: 65 tablet, Rfl: 2   pantoprazole (PROTONIX) 40 MG tablet, TAKE 1 TABLET(40 MG) BY MOUTH TWICE DAILY, Disp: 180 tablet, Rfl:  1   RESTASIS 0.05 % ophthalmic emulsion, 1 drop 2 (two) times daily., Disp: , Rfl:   Review of Systems:  Negative unless indicated in HPI.   Physical Exam: Vitals:   03/08/23 1642  BP: 130/80  Pulse: 60  Temp: (!) 97.4 F (36.3 C)  TempSrc: Oral  SpO2: 96%  Weight: 148 lb 11.2 oz (67.4 kg)  Height: 5\' 4"  (1.626 m)    Body mass index is 25.52 kg/m.   Physical Exam Vitals reviewed.  Constitutional:      General: She is not in acute distress.    Appearance: Normal appearance. She is not ill-appearing, toxic-appearing or diaphoretic.  HENT:     Head: Normocephalic.     Right Ear: Tympanic membrane, ear canal and external ear normal. There is no impacted cerumen.     Left Ear: Tympanic membrane, ear canal and external ear normal. There is no impacted cerumen.     Nose: Nose normal.     Mouth/Throat:     Mouth: Mucous membranes are moist.     Pharynx: Oropharynx is clear. No oropharyngeal exudate or posterior oropharyngeal erythema.  Eyes:     General: No scleral icterus.       Right eye: No discharge.        Left eye: No discharge.     Conjunctiva/sclera: Conjunctivae normal.     Pupils: Pupils are equal, round, and reactive to light.  Neck:     Vascular: No carotid bruit.  Cardiovascular:     Rate and Rhythm: Normal rate and regular rhythm.     Pulses: Normal pulses.     Heart sounds: Normal heart sounds.  Pulmonary:     Effort: Pulmonary effort is normal. No respiratory distress.     Breath sounds: Normal breath sounds.  Abdominal:     General: Abdomen is flat. Bowel sounds are normal.     Palpations: Abdomen is soft.  Musculoskeletal:        General: Normal range of motion.     Cervical back: Normal range of motion.  Skin:    General: Skin is warm and dry.  Neurological:     General: No focal deficit present.     Mental Status: She is alert and oriented to person, place, and time. Mental status is at baseline.  Psychiatric:        Mood and Affect: Mood  normal.        Behavior: Behavior  normal.        Thought Content: Thought content normal.        Judgment: Judgment normal.    Subsequent Medicare wellness visit   1. Risk factors, based on past  M,S,F - Cardiac Risk Factors include: advanced age (>30men, >66 women)   2.  Physical activities: Dietary issues and exercise activities discussed:  Current Exercise Habits: Home exercise routine, Type of exercise: walking, Time (Minutes): 50, Frequency (Times/Week): 5, Weekly Exercise (Minutes/Week): 250, Intensity: Moderate   3.  Depression/mood:  Flowsheet Row Office Visit from 03/09/2023 in [facility] Total Score 0        4.  ADL's:    03/08/2023    4:41 PM  In your present state of health, do you have any difficulty performing the following activities:  Hearing? 1  Comment patient wears hearing aids  Vision? 0  Difficulty concentrating or making decisions? 0  Walking or climbing stairs? 0  Dressing or bathing? 0  Doing errands, shopping? 0  Preparing Food and eating ? N  Using the Toilet? N  In the past six months, have you accidently leaked urine? Y  Do you have problems with loss of bowel control? N  Managing your Medications? N  Managing your Finances? N  Housekeeping or managing your Housekeeping? N     5.  Fall risk:     08/28/2021    1:16 PM 10/29/2021   10:00 AM 02/18/2022    1:09 PM 09/21/2022    1:38 PM 03/08/2023    4:44 PM  Fall Risk  Falls in the past year? 0 0 0 0 0  Was there an injury with Fall? 0  0 0 0  Fall Risk Category Calculator 0  0 0 0  Fall Risk Category (Retired) Low  Low Low   (RETIRED) Patient Fall Risk Level  Low fall risk Low fall risk Low fall risk   Patient at Risk for Falls Due to   No Fall Risks No Fall Risks   Fall risk Follow up   Falls evaluation completed Falls evaluation completed Falls evaluation completed     6.  Home safety: No problems identified   7.  Height weight, and visual  acuity: height and weight as above, vision/hearing: Vision Screening   Right eye Left eye Both eyes  Without correction     With correction 20/20 20/20 20/20      8.  Counseling: Counseling given: Not Answered    9. Lab orders based on risk factors: Laboratory update will be reviewed   10. Cognitive assessment:        03/08/2023    4:45 PM  6CIT Screen  What Year? 0 points  What month? 0 points  What time? 0 points  Count back from 20 0 points  Months in reverse 0 points  Repeat phrase 0 points  Total Score 0 points     11. Screening: Patient provided with a written and personalized 5-10 year screening schedule in the AVS. Health Maintenance  Topic Date Due   Hepatitis C Screening: USPSTF Recommendation to screen - Ages 69-79 yo.  Never done   COVID-19 Vaccine (6 - 2023-24 season) 06/25/2022   Flu Shot  05/26/2023   Medicare Annual Wellness Visit  03/08/2024   Mammogram  03/29/2024   DTaP/Tdap/Td vaccine (2 - Td or Tdap) 10/26/2027   Colon Cancer Screening  09/09/2028   Pneumonia Vaccine  Completed   DEXA scan (bone  density measurement)  Completed   Zoster (Shingles) Vaccine  Completed   HPV Vaccine  Aged Out    12. Provider List Update: Patient Care Team    Relationship Specialty Notifications Start End  Philip Aspen, Limmie Patricia, MD PCP - General Internal Medicine  09/12/20   Suzi Roots Physician Assistant Dermatology  02/12/21      13. Advance Directives: Does Patient Have a Medical Advance Directive?: No Would patient like information on creating a medical advance directive?: No - Patient declined  14. Opioids: Patient is not on any opioid prescriptions and has no risk factors for a substance use disorder.   15.   Goals      garden         I have personally reviewed and noted the following in the patient's chart:   Medical and social history Use of alcohol, tobacco or illicit drugs  Current medications and supplements Functional  ability and status Nutritional status Physical activity Advanced directives List of other physicians Hospitalizations, surgeries, and ER visits in previous 12 months Vitals Screenings to include cognitive, depression, and falls Referrals and appointments  In addition, I have reviewed and discussed with patient certain preventive protocols, quality metrics, and best practice recommendations. A written personalized care plan for preventive services as well as general preventive health recommendations were provided to patient.   Impression and Plan:  Medicare annual wellness visit, subsequent  Vitamin B12 deficiency -     Vitamin B12; Future  Primary hypertension -     CBC with Differential/Platelet; Future -     Comprehensive metabolic panel; Future  Hyperlipidemia, unspecified hyperlipidemia type -     Lipid panel; Future  Hyperthyroidism -     TSH; Future  Age-related osteoporosis without current pathological fracture -     VITAMIN D 25 Hydroxy (Vit-D Deficiency, Fractures); Future  OSA on CPAP  Gastroesophageal reflux disease, unspecified whether esophagitis present  Insomnia, unspecified type  Postmenopausal estrogen deficiency -     DG Bone Density; Future   -Recommend routine eye and dental care. -Healthy lifestyle discussed in detail. -Labs to be updated today. -Prostate cancer screening: N/A Health Maintenance  Topic Date Due   Hepatitis C Screening: USPSTF Recommendation to screen - Ages 59-79 yo.  Never done   COVID-19 Vaccine (6 - 2023-24 season) 06/25/2022   Flu Shot  05/26/2023   Medicare Annual Wellness Visit  03/08/2024   Mammogram  03/29/2024   DTaP/Tdap/Td vaccine (2 - Td or Tdap) 10/26/2027   Colon Cancer Screening  09/09/2028   Pneumonia Vaccine  Completed   DEXA scan (bone density measurement)  Completed   Zoster (Shingles) Vaccine  Completed   HPV Vaccine  Aged Out    -For insomnia we have covered in detail sleep hygiene techniques  including limiting caffeine and fluids before bedtime, creating a routine, limiting alcohol use.   Chaya Jan, MD [facility]

## 2023-03-09 NOTE — Telephone Encounter (Signed)
Prior Auth required for PROLIA  PA PROCESS DETAILS: Please complete the prior authorization form located at [email or web address] or call [phone]

## 2023-03-10 DIAGNOSIS — H442A2 Degenerative myopia with choroidal neovascularization, left eye: Secondary | ICD-10-CM | POA: Diagnosis not present

## 2023-03-14 NOTE — Telephone Encounter (Signed)
Prior Auth: APPROVED PA# [id number] Valid: 09/23/22-09/24/23

## 2023-03-14 NOTE — Telephone Encounter (Signed)
Pt ready for scheduling on or after 04/14/23  Out-of-pocket cost due at time of visit: $332  Primary: UHC AARP Medicare Adv PPO Prolia co-insurance: 20% (approximately $302) Admin fee co-insurance: $30  Deductible: does not apply  rior Auth: APPROVED PA# [id number] Valid: 09/23/22-09/24/23  Secondary:  Prolia co-insurance:  Admin fee co-insurance:   Deductible:   Prior Auth:  PA# Valid:   ** This summary of benefits is an estimation of the patient's out-of-pocket cost. Exact cost may vary based on individual plan coverage.

## 2023-03-15 ENCOUNTER — Encounter: Payer: Self-pay | Admitting: Internal Medicine

## 2023-03-15 ENCOUNTER — Other Ambulatory Visit: Payer: Self-pay | Admitting: Internal Medicine

## 2023-03-15 DIAGNOSIS — E782 Mixed hyperlipidemia: Secondary | ICD-10-CM

## 2023-03-16 ENCOUNTER — Ambulatory Visit (INDEPENDENT_AMBULATORY_CARE_PROVIDER_SITE_OTHER): Payer: Medicare Other

## 2023-03-16 DIAGNOSIS — E538 Deficiency of other specified B group vitamins: Secondary | ICD-10-CM

## 2023-03-16 MED ORDER — CYANOCOBALAMIN 1000 MCG/ML IJ SOLN
1000.0000 ug | Freq: Once | INTRAMUSCULAR | Status: AC
Start: 2023-03-16 — End: 2023-03-16
  Administered 2023-03-16: 1000 ug via INTRAMUSCULAR

## 2023-03-16 NOTE — Progress Notes (Signed)
Per orders of Dr. Hernandez, injection of Cyanocobalamin 1000 mcg given by Vincente Asbridge L Dshawn Mcnay. Patient tolerated injection well.  

## 2023-03-18 ENCOUNTER — Other Ambulatory Visit: Payer: Self-pay | Admitting: Family Medicine

## 2023-03-18 DIAGNOSIS — G8929 Other chronic pain: Secondary | ICD-10-CM

## 2023-03-18 NOTE — Telephone Encounter (Signed)
Last OV 11/10/22 Next OV not scheduled  Last refill 01/06/23 Qty # 60/1   Normal renal function labs

## 2023-03-26 NOTE — Telephone Encounter (Signed)
Prior Auth: APPROVED PA# [id number] Valid: 03/26/23-03/25/24

## 2023-03-26 NOTE — Telephone Encounter (Signed)
Update PA to Dr. Hudnall 

## 2023-03-26 NOTE — Telephone Encounter (Signed)
Pt ready for scheduling on or after 04/14/23   Out-of-pocket cost due at time of visit: $332   Primary: UHC AARP Medicare Adv PPO Prolia co-insurance: 20% (approximately $302) Admin fee co-insurance: $30   Deductible: does not apply   Prior Auth (updated to Hudnall): APPROVED PA# [id number] Valid: 03/26/23-03/25/24   Secondary:  Prolia co-insurance:  Admin fee co-insurance:  Deductible:  Prior Auth:  PA# Valid:    ** This summary of benefits is an estimation of the patient's out-of-pocket cost. Exact cost may vary based on individual plan coverage.

## 2023-03-31 NOTE — Telephone Encounter (Signed)
Pt informed of below.  Nurse visit scheduled 04/14/23.

## 2023-04-07 DIAGNOSIS — H43811 Vitreous degeneration, right eye: Secondary | ICD-10-CM | POA: Diagnosis not present

## 2023-04-07 DIAGNOSIS — H15832 Staphyloma posticum, left eye: Secondary | ICD-10-CM | POA: Diagnosis not present

## 2023-04-07 DIAGNOSIS — H35412 Lattice degeneration of retina, left eye: Secondary | ICD-10-CM | POA: Diagnosis not present

## 2023-04-07 DIAGNOSIS — H442A2 Degenerative myopia with choroidal neovascularization, left eye: Secondary | ICD-10-CM | POA: Diagnosis not present

## 2023-04-07 DIAGNOSIS — H35371 Puckering of macula, right eye: Secondary | ICD-10-CM | POA: Diagnosis not present

## 2023-04-07 DIAGNOSIS — H53002 Unspecified amblyopia, left eye: Secondary | ICD-10-CM | POA: Diagnosis not present

## 2023-04-07 DIAGNOSIS — H35361 Drusen (degenerative) of macula, right eye: Secondary | ICD-10-CM | POA: Diagnosis not present

## 2023-04-11 ENCOUNTER — Ambulatory Visit (HOSPITAL_BASED_OUTPATIENT_CLINIC_OR_DEPARTMENT_OTHER)
Admission: RE | Admit: 2023-04-11 | Discharge: 2023-04-11 | Disposition: A | Discharge status: Home / Self Care | Payer: Medicare Other | Source: Ambulatory Visit | Attending: Internal Medicine | Admitting: Internal Medicine

## 2023-04-11 DIAGNOSIS — Z780 Asymptomatic menopausal state: Secondary | ICD-10-CM | POA: Insufficient documentation

## 2023-04-11 DIAGNOSIS — M85852 Other specified disorders of bone density and structure, left thigh: Secondary | ICD-10-CM | POA: Diagnosis not present

## 2023-04-14 ENCOUNTER — Ambulatory Visit (INDEPENDENT_AMBULATORY_CARE_PROVIDER_SITE_OTHER): Payer: Medicare Other | Admitting: *Deleted

## 2023-04-14 ENCOUNTER — Other Ambulatory Visit: Payer: Self-pay | Admitting: Internal Medicine

## 2023-04-14 DIAGNOSIS — M810 Age-related osteoporosis without current pathological fracture: Secondary | ICD-10-CM | POA: Diagnosis not present

## 2023-04-14 DIAGNOSIS — Z1231 Encounter for screening mammogram for malignant neoplasm of breast: Secondary | ICD-10-CM

## 2023-04-14 MED ORDER — DENOSUMAB 60 MG/ML ~~LOC~~ SOSY
60.0000 mg | PREFILLED_SYRINGE | Freq: Once | SUBCUTANEOUS | Status: AC
Start: 2023-04-14 — End: 2023-04-14
  Administered 2023-04-14: 60 mg via SUBCUTANEOUS

## 2023-04-14 NOTE — Progress Notes (Signed)
Patient here for nurse visit for Prolia 60 mg. She received [place] injection in her left arm. She tolerated injection well. She return for her next Prolia in December 2024.

## 2023-04-21 ENCOUNTER — Other Ambulatory Visit: Payer: Self-pay | Admitting: Family Medicine

## 2023-04-21 DIAGNOSIS — G8929 Other chronic pain: Secondary | ICD-10-CM

## 2023-04-21 NOTE — Telephone Encounter (Signed)
Last OV 11/10/22 Next OV not scheduled  Last refill 03/18/23 Qty #60/1   Normal renal labs

## 2023-05-02 NOTE — Telephone Encounter (Signed)
Last Prolia inj 04/14/23 Next Prolia inj due 10/15/23

## 2023-05-04 ENCOUNTER — Telehealth: Payer: Self-pay | Admitting: Internal Medicine

## 2023-05-04 ENCOUNTER — Ambulatory Visit
Admission: RE | Admit: 2023-05-04 | Discharge: 2023-05-04 | Disposition: A | Discharge status: Home / Self Care | Payer: Medicare Other | Source: Ambulatory Visit | Attending: Internal Medicine | Admitting: Internal Medicine

## 2023-05-04 DIAGNOSIS — Z1231 Encounter for screening mammogram for malignant neoplasm of breast: Secondary | ICD-10-CM

## 2023-05-04 NOTE — Telephone Encounter (Signed)
Called and got pt scheduled with Dr. Maple Hudson @ 9 am Friday. Pt has been using cpap. NFN

## 2023-05-04 NOTE — Telephone Encounter (Signed)
Ok to use held spot. We also want to know status of CPAP use when she comes.

## 2023-05-04 NOTE — Telephone Encounter (Signed)
Dr. Maple Hudson please advise if your available Friday

## 2023-05-04 NOTE — Telephone Encounter (Signed)
Unable to sleep for more than 4-5 hours; I've done all of the sleep "hygiene" tasks recommended. I'm exhausted.  Pt. Messaged through my chart was going to call to make acute visit but nothing available un less Dr. Maple Hudson is good to give up a held spot Friday for pt. Please advise

## 2023-05-05 ENCOUNTER — Ambulatory Visit (INDEPENDENT_AMBULATORY_CARE_PROVIDER_SITE_OTHER): Payer: Medicare Other | Admitting: *Deleted

## 2023-05-05 DIAGNOSIS — E538 Deficiency of other specified B group vitamins: Secondary | ICD-10-CM | POA: Diagnosis not present

## 2023-05-05 MED ORDER — CYANOCOBALAMIN 1000 MCG/ML IJ SOLN
1000.0000 ug | Freq: Once | INTRAMUSCULAR | Status: AC
Start: 2023-05-05 — End: 2023-05-05
  Administered 2023-05-05: 1000 ug via INTRAMUSCULAR

## 2023-05-05 NOTE — Progress Notes (Signed)
Per orders of Dr. Hernandez, injection of B12 given by Rance Smithson. Patient tolerated injection well.  

## 2023-05-06 ENCOUNTER — Ambulatory Visit: Payer: Medicare Other | Admitting: Internal Medicine

## 2023-05-06 ENCOUNTER — Encounter: Payer: Self-pay | Admitting: Internal Medicine

## 2023-05-06 VITALS — BP 112/64 | HR 71 | Ht 63.0 in | Wt 150.0 lb

## 2023-05-06 DIAGNOSIS — G4733 Obstructive sleep apnea (adult) (pediatric): Secondary | ICD-10-CM

## 2023-05-06 DIAGNOSIS — F5101 Primary insomnia: Secondary | ICD-10-CM

## 2023-05-06 MED ORDER — ZALEPLON 5 MG PO CAPS: ORAL_CAPSULE | ORAL | 3 refills | Status: DC

## 2023-05-06 NOTE — Progress Notes (Signed)
HPI F never smoker followed for OSA, complicated by HTN,Hypercholesterolemia, Hyperthyroid,  HST 04/29/21- AHI/ 17.8/ hr, desaturation to 88%, body weight 163 lbs  ========================================================================   11/10/21- 70 yoF never smoker followed for OSA, complicated by HTN,Hypercholesterolemia, Hyperthyroid,  CPAP auto 5-15/ [employer] Download-compliance 97%, AHI 0.7/ hr  Body weight today-172 lbs Covid vax-5 Moderna Flu vax-had -----Patient is doing good, no concerns She is comfortable with her new machine.  [employer] gave her a different nasal mask that may not seal as well.  We reviewed download and discussed. Otherwise feels stable.  05/06/23- 72 yoF never smoker followed for OSA, complicated by HTN,Hypercholesterolemia, Hyperthyroid,  CPAP auto 5-15/ [employer] Download-compliance 89%, AHI 0.7/ hr  Body weight today-150 lbs      note weight loss Comfortable and compliant with CPAP. Download reviewed. Complains of stable, chronic difficulty with wake after sleep onset, most nights, usually 2-3AM. Persistent daytime fatigue, not irresistible sleepiness. Pattern present for many years and more bothersome now that she's retired.  Very attentive to sleep hygiene. Discussed trial of short half-life sonata. She didn't like gabapentin or trazodone. Will try Sonata. Consider trial of adderall 10 mg bid later.  ROS-see HPI   + = positive Constitutional:    weight loss, night sweats, fevers, chills, fatigue, lassitude. HEENT:    headaches, difficulty swallowing, tooth/dental problems, sore throat,       sneezing, itching, ear ache, +nasal congestion, post nasal drip, snoring CV:    chest pain, orthopnea, PND, swelling in lower extremities, anasarca,                                   dizziness, palpitations Resp:   shortness of breath with exertion or at rest.                productive cough,   non-productive cough, coughing up of blood.              change in color of  mucus.  wheezing.   Skin:    rash or lesions. GI:  No-   heartburn, indigestion, abdominal pain, nausea, vomiting, diarrhea,                 change in bowel habits, loss of appetite GU: dysuria, change in color of urine, no urgency or frequency.   flank pain. MS:   joint pain, stiffness, decreased range of motion, back pain. Neuro-     nothing unusual Psych:  change in mood or affect.  depression or anxiety.   memory loss.  OBJ- Physical Exam General- Alert, Oriented, Affect-appropriate, Distress- none acute,  Skin- rash-none, lesions- none, excoriation- none Lymphadenopathy- none Head- atraumatic            Eyes- +strabismus            Ears- Hearing, canals-normal            Nose- Clear, no-Septal dev, mucus, polyps, erosion, perforation             Throat- Mallampati II , mucosa clear , drainage- none, tonsils- atrophic, + teeth Neck- flexible , trachea midline, no stridor , thyroid nl, carotid no bruit Chest - symmetrical excursion , unlabored           Heart/CV- RRR , no murmur , no gallop  , no rub, nl s1 s2                           -  JVD- none , edema- none, stasis changes- none, varices- none           Lung- clear to P&A, wheeze- none, cough- none , dullness-none, rub- none           Chest wall-  Abd-  Br/ Gen/ Rectal- Not done, not indicated Extrem- cyanosis- none, clubbing, none, atrophy- none, strength- nl Neuro- grossly intact to observation

## 2023-05-06 NOTE — Patient Instructions (Signed)
We can continue CPAP auto 5-15.  Script sent to try Sonata, 1 or 2 caps around 2:00-3:00 AM if you are having trouble getting back to sleep, as needed.

## 2023-05-09 ENCOUNTER — Telehealth: Payer: Self-pay

## 2023-05-09 NOTE — Telephone Encounter (Signed)
*  Pulm  PA request received for Zaleplon 5MG  capsules  PA submitted to [employer] Medicare Part D via CMM and is pending additional questions/determination  Key: [facility]

## 2023-05-12 NOTE — Telephone Encounter (Signed)
Patient states needs 5 mg Zaleplon. Patient phone number is [phone].

## 2023-05-12 NOTE — Telephone Encounter (Signed)
PA has been DENIED due to:   ZALEPLON CAP 5MG , (use as directed: 60 capsules per 30 days), is denied because the information provided was not sufficient to support approval for medical necessity. Your plan limit is 30 capsules per 30 days. Coverage for the requested quantity requires the following: (1) If your diagnosis includes short-term treatment of insomnia or chronic insomnia. Reviewed by: [email or web address], R.Ph. **Please note: ZALEPLON CAP 10MG  is commercially available and will process at your pharmacy for up to 60 capsules per 30 days. Please consider switching to this commercially available dose. *Please note: This product is on the high-risk medication list and is not recommended in patients 65 years and older due to a higher risk potential for side effects. According to the Centers for Medicare and Medicaid Services, the Pharmacy Quality Alliance, the [employer], and the [employer] for [employer] (NCQA) this medication should be avoided in the elderly. "Drugs to be Avoided in the Elderly" is measure 238 of the Centers for Medicare and Medicaid Services Physician Quality Reporting System. There may be safer treatment alternatives to consider.

## 2023-05-18 NOTE — Telephone Encounter (Addendum)
Tried to see what diagnosis code was used for PA but it already archived.   PA Team, what diagnosis code was used for this PA? She has a diagnosis of insomnia.

## 2023-05-19 DIAGNOSIS — H35412 Lattice degeneration of retina, left eye: Secondary | ICD-10-CM | POA: Diagnosis not present

## 2023-05-19 DIAGNOSIS — H35371 Puckering of macula, right eye: Secondary | ICD-10-CM | POA: Diagnosis not present

## 2023-05-19 DIAGNOSIS — H43811 Vitreous degeneration, right eye: Secondary | ICD-10-CM | POA: Diagnosis not present

## 2023-05-19 DIAGNOSIS — H442A2 Degenerative myopia with choroidal neovascularization, left eye: Secondary | ICD-10-CM | POA: Diagnosis not present

## 2023-05-19 DIAGNOSIS — H35361 Drusen (degenerative) of macula, right eye: Secondary | ICD-10-CM | POA: Diagnosis not present

## 2023-05-19 DIAGNOSIS — H15832 Staphyloma posticum, left eye: Secondary | ICD-10-CM | POA: Diagnosis not present

## 2023-05-20 NOTE — Telephone Encounter (Signed)
Yes Insomnia was used

## 2023-05-24 DIAGNOSIS — I10 Essential (primary) hypertension: Secondary | ICD-10-CM | POA: Diagnosis not present

## 2023-05-24 DIAGNOSIS — G4733 Obstructive sleep apnea (adult) (pediatric): Secondary | ICD-10-CM | POA: Diagnosis not present

## 2023-05-31 ENCOUNTER — Ambulatory Visit: Payer: Medicare Other | Admitting: Internal Medicine

## 2023-05-31 ENCOUNTER — Encounter: Payer: Self-pay | Admitting: Internal Medicine

## 2023-05-31 VITALS — BP 120/80 | HR 63 | Ht 63.0 in | Wt 152.0 lb

## 2023-05-31 DIAGNOSIS — E0590 Thyrotoxicosis, unspecified without thyrotoxic crisis or storm: Secondary | ICD-10-CM | POA: Diagnosis not present

## 2023-05-31 LAB — TSH: TSH: 2.35 u[IU]/mL (ref 0.35–5.50)

## 2023-05-31 LAB — T4, FREE: Free T4: 0.96 ng/dL (ref 0.60–1.60)

## 2023-05-31 MED ORDER — METHIMAZOLE 5 MG PO TABS
2.5000 mg | ORAL_TABLET | ORAL | 2 refills | Status: DC
Start: 2023-05-31 — End: 2023-12-01

## 2023-05-31 NOTE — Progress Notes (Signed)
Name: Bethany Cole  MRN/ DOB: [id number], 1951/01/21    Age/ Sex: 72 y.o., female     PCP: Bethany Cole, Bethany Patricia, MD   Reason for Endocrinology Evaluation: Hyperthyroidism     Initial Endocrinology Clinic Visit: 12/03/2020    PATIENT IDENTIFIER: [name] is a 72 y.o., female with a past medical history of HTN, GERD and Hyperthyroidism. She has followed with [facility] since 12/03/2020 for consultative assistance with management of her Hyperthyroidism.    Moved from [place] 04/2020  HISTORICAL SUMMARY:  She has been diagnosed with hyperthyroidism in 11/2019 during routine work up with a TSH of 0.15 uIU/L , FT4 1.5 ng/dL ( 7.8-2.9 ng/dL) and an elevated T3 5.62 pg/mL ( 1.7-3.7) . She had worsening anxiety and tremors at the time , this was attributed to Grave's Disease  On her initial visit at our clinic she was on methimazole 2.5 mg daily    No FH of thyroid disease  SUBJECTIVE:     Today (05/31/2023):  Bethany Cole is here for hyperthyroidism.    She continues to follow with GI for chronic constipation She had an eye exam with Dr. Emily Filbert for dry eyes She had a follow-up with pulmonary, continues to be on CPAP  Weight has been stable  Denies palpitation  Denies diarrhea but has occasional constipation  Hand tremors are stable  Denies local neck symptoms  Has noted dry hair  Has fatigue , continues to sleeping 5.5 hrs of sleep    Methimazole 5 mg , HALF a tablet Monday through Thursday  (skip Friday, Saturday and Sunday)      HISTORY:  Past Medical History:  Past Medical History:  Diagnosis Date   Allergy    Anemia    Anxiety    Arthritis    Cataracts, bilateral    Gastritis    GERD (gastroesophageal reflux disease)    Heart murmur    Hypertension    Hyperthyroidism    OSA on CPAP    Sleep apnea    Squamous cell carcinoma of skin    Past Surgical History:  Past Surgical History:  Procedure Laterality Date   ABDOMINAL  HYSTERECTOMY     ENDOMETRIAL ABLATION     EYE SURGERY     HERNIA REPAIR     as an infant   UPPER GASTROINTESTINAL ENDOSCOPY  11/09/2021   Has had sevearal in [place]   WRIST ARTHROSCOPY Right    Social History:  reports that she has never smoked. She has never used smokeless tobacco. She reports current alcohol use of about 1.0 standard drink of alcohol per week. She reports that she does not use drugs. Family History:  Family History  Problem Relation Age of Onset   Ovarian cancer Mother    Stomach cancer Mother        mets from ovarian cancer   Arthritis Mother    Cancer Mother    Hearing loss Mother    Hypertension Mother    Heart disease Father    Cancer - Other Father        laryngeal cancer   Alcohol abuse Father    Arthritis Father    Hyperlipidemia Father    Hypertension Father    Hyperlipidemia Brother    Colon cancer Neg Hx    Esophageal cancer Neg Hx    Pancreatic cancer Neg Hx    Liver disease Neg Hx    Colon polyps Neg Hx    Rectal cancer Neg  Hx      HOME MEDICATIONS: Allergies as of 05/31/2023       Reactions   Doxycycline Nausea And Vomiting   Erythromycin    Macrobid [nitrofurantoin]    Penicillins    Sulfa Antibiotics         Medication List        Accurate as of May 31, 2023  2:53 PM. If you have any questions, ask your nurse or doctor.          acetaminophen 650 MG CR tablet Commonly known as: TYLENOL Take 650 mg by mouth at bedtime as needed for pain.   celecoxib 100 MG capsule Commonly known as: CELEBREX TAKE 1 CAPSULE(100 MG) BY MOUTH TWICE DAILY AS NEEDED FOR MODERATE PAIN   denosumab 60 MG/ML Sosy injection Commonly known as: PROLIA Inject 60 mg into the skin every 6 (six) months. Took in December 2022.   diclofenac Sodium 1 % Gel Commonly known as: VOLTAREN Apply topically 4 (four) times daily.   dicyclomine 10 MG capsule Commonly known as: BENTYL Take 1 capsule (10 mg total) by mouth every 8 (eight) hours as  needed (abdominal cramping/diarrhea).   fluticasone 50 MCG/ACT nasal spray Commonly known as: Flonase Place 1 spray into both nostrils 2 (two) times daily.   irbesartan 75 MG tablet Commonly known as: AVAPRO TAKE 1 TABLET(75 MG) BY MOUTH DAILY   Magnesium 400 MG Tabs Take 400 mg by mouth 3 (three) times daily.   methimazole 5 MG tablet Commonly known as: TAPAZOLE Take 0.5 tablets (2.5 mg total) by mouth as directed. Half a tablet Monday through Friday (skip Saturday and Sunday) What changed: additional instructions   pantoprazole 40 MG tablet Commonly known as: PROTONIX TAKE 1 TABLET(40 MG) BY MOUTH TWICE DAILY   Restasis 0.05 % ophthalmic emulsion Generic drug: cycloSPORINE 1 drop 2 (two) times daily.   VITAMIN B-12 IJ Inject as directed every 30 (thirty) days.   zaleplon 5 MG capsule Commonly known as: SONATA 1 or 2 caps as needed for sleep          OBJECTIVE:   PHYSICAL EXAM: VS: BP 120/80 (BP Location: Right Arm, Patient Position: Sitting, Cuff Size: Large)   Pulse 63   Ht 5\' 3"  (1.6 m)   Wt 152 lb (68.9 kg)   SpO2 99%   BMI 26.93 kg/m    EXAM: General: Pt appears well and is in NAD  Neck: General: Supple without adenopathy. Thyroid: Thyroid size normal.  No goiter or nodules appreciated.   Lungs: Clear with good BS bilat   Heart: Auscultation: RRR.  Abdomen:  soft, nontender  Extremities:  BL LE: No pretibial edema   Mental Status: Judgment, insight: Intact Mood and affect: No depression, anxiety, or agitation     DATA REVIEWED:   Latest Reference Range & Units 05/31/23 11:27  TSH 0.35 - 5.50 uIU/mL 2.35  T4,Free(Direct) 0.60 - 1.60 ng/dL 0.86    Results for Bethany Cole, Bethany Cole (MRN [id number]) as of 04/09/2021 07:59  Ref. Range 12/03/2020 09:39  TRAB Latest Ref Range: <=2.00 IU/L 1.02    ASSESSMENT / PLAN / RECOMMENDATIONS:   Hyperthyroidism :    - This has been attributed to Graves' disease through a prior diagnosis. TRAB here was  detectable but not elevated  at 1.02 IU/L but she already has been on methimazole at the time  - No local neck symptoms  -TFTs continue to be normal , no changes  -Patient with fatigue that is not attributed  to her thyroid at this time -Patient with dry hair, I have advised her to use moisturizing her masks  Medications   Continue   Methimazole 5 mg , HALF a tablet Monday through Thursday  (skip Friday, Saturday and Sunday)   2. Graves' Disease:   - No extra thyroidal manifestations of Graves' Disease   F/U in 6 months      Signed electronically by: Lyndle Herrlich, MD  [facility]  [facility] [address] [place], [place] [address] Phone: [phone] FAX: [phone]      CC: Bethany Cole, Bethany Patricia, MD [address] [place] [address] Phone: [phone]  Fax: [phone]   Return to Endocrinology clinic as below: Future Appointments  Date Time Provider Department Center  06/06/2023  9:30 AM [facility]  08/09/2023 10:00 AM Jetty Duhamel D, MD [facility] None  12/01/2023  9:30 AM , Konrad Dolores, MD [facility] None

## 2023-06-06 ENCOUNTER — Ambulatory Visit: Payer: Medicare Other | Admitting: *Deleted

## 2023-06-06 DIAGNOSIS — E538 Deficiency of other specified B group vitamins: Secondary | ICD-10-CM | POA: Diagnosis not present

## 2023-06-06 MED ORDER — CYANOCOBALAMIN 1000 MCG/ML IJ SOLN
1000.0000 ug | Freq: Once | INTRAMUSCULAR | Status: AC
Start: 2023-06-06 — End: 2023-06-06
  Administered 2023-06-06: 1000 ug via INTRAMUSCULAR

## 2023-06-06 NOTE — Progress Notes (Signed)
Per orders of Dr. Legrand Como, injection of Cyanocobalamin 1091mg given by FAgnes Lawrence Patient tolerated injection well.

## 2023-06-07 ENCOUNTER — Encounter: Payer: Self-pay | Admitting: Internal Medicine

## 2023-06-07 DIAGNOSIS — G4700 Insomnia, unspecified: Secondary | ICD-10-CM | POA: Insufficient documentation

## 2023-06-07 NOTE — Assessment & Plan Note (Signed)
Complains of recurrent wake after sleep onset and daytime fatigue.  Sleep hygiene discussed. Plan-try Sonata 5 mg, 1 or 2 caps as needed.  We may need to try adding a stimulant such as Adderall 10 mg in the daytime.  Encourage naps before medication.

## 2023-06-07 NOTE — Assessment & Plan Note (Signed)
Effects from CPAP with good compliance and control Plan-continue auto 5-15

## 2023-06-09 ENCOUNTER — Encounter (INDEPENDENT_AMBULATORY_CARE_PROVIDER_SITE_OTHER): Payer: Self-pay

## 2023-06-10 NOTE — Progress Notes (Signed)
Per orders of Dr. Legrand Como, injection of Cyanocobalamin 1091mg given by FAgnes Lawrence Patient tolerated injection well.

## 2023-07-07 ENCOUNTER — Ambulatory Visit: Payer: Medicare Other

## 2023-07-13 ENCOUNTER — Ambulatory Visit (INDEPENDENT_AMBULATORY_CARE_PROVIDER_SITE_OTHER): Payer: Medicare Other

## 2023-07-13 DIAGNOSIS — E538 Deficiency of other specified B group vitamins: Secondary | ICD-10-CM | POA: Diagnosis not present

## 2023-07-13 MED ORDER — CYANOCOBALAMIN 1000 MCG/ML IJ SOLN
1000.0000 ug | Freq: Once | INTRAMUSCULAR | Status: AC
Start: 2023-07-13 — End: 2023-07-13
  Administered 2023-07-13: 1000 ug via INTRAMUSCULAR

## 2023-07-13 NOTE — Progress Notes (Signed)
Per orders of Dr. Ardyth Harps, injection of B-12 given by Stann Ore. Patient tolerated injection well.

## 2023-07-21 DIAGNOSIS — H442A2 Degenerative myopia with choroidal neovascularization, left eye: Secondary | ICD-10-CM | POA: Diagnosis not present

## 2023-07-21 DIAGNOSIS — H35412 Lattice degeneration of retina, left eye: Secondary | ICD-10-CM | POA: Diagnosis not present

## 2023-07-21 DIAGNOSIS — H35371 Puckering of macula, right eye: Secondary | ICD-10-CM | POA: Diagnosis not present

## 2023-07-21 DIAGNOSIS — H43811 Vitreous degeneration, right eye: Secondary | ICD-10-CM | POA: Diagnosis not present

## 2023-07-21 DIAGNOSIS — H15832 Staphyloma posticum, left eye: Secondary | ICD-10-CM | POA: Diagnosis not present

## 2023-07-21 DIAGNOSIS — H35361 Drusen (degenerative) of macula, right eye: Secondary | ICD-10-CM | POA: Diagnosis not present

## 2023-08-07 NOTE — Progress Notes (Signed)
HPI F never smoker followed for OSA, complicated by HTN,Hypercholesterolemia, Hyperthyroid,  HST 04/29/21- AHI/ 17.8/ hr, desaturation to 88%, body weight 163 lbs  ========================================================================  05/06/23- 72 yoF never smoker followed for OSA, complicated by HTN,Hypercholesterolemia, Hyperthyroid,  CPAP auto 5-15/ [employer] Download-compliance 89%, AHI 0.7/ hr  Body weight today-150 lbs      note weight loss Comfortable and compliant with CPAP. Download reviewed. Complains of stable, chronic difficulty with wake after sleep onset, most nights, usually 2-3AM. Persistent daytime fatigue, not irresistible sleepiness. Pattern present for many years and more bothersome now that she's retired.  Very attentive to sleep hygiene. Discussed trial of short half-life sonata. She didn't like gabapentin or trazodone. Will try Sonata. Consider trial of adderall 10 mg bid later.  08/09/23-  72 yoF never smoker followed for OSA, complicated by HTN,Hypercholesterolemia, Hyperthyroid,  CPAP auto 5-15/ [employer] Download-compliance 77%, AHI 0.5/hr  Body weight today-150 lbs      note weight loss -Sonata 5 mg,  Download reviewed. Falls asleep quickly, but still wakes and hard to get back to sleep. Sonata at 03:00AM last too long. Taking celebrex for hip pain. Thyroid being managed. Flu vax senior today  ROS-see HPI   + = positive Constitutional:    weight loss, night sweats, fevers, chills, fatigue, lassitude. HEENT:    headaches, difficulty swallowing, tooth/dental problems, sore throat,       sneezing, itching, ear ache, +nasal congestion, post nasal drip, snoring CV:    chest pain, orthopnea, PND, swelling in lower extremities, anasarca,                                   dizziness, palpitations Resp:   shortness of breath with exertion or at rest.                productive cough,   non-productive cough, coughing up of blood.              change in color of mucus.   wheezing.   Skin:    rash or lesions. GI:  No-   heartburn, indigestion, abdominal pain, nausea, vomiting, diarrhea,                 change in bowel habits, loss of appetite GU: dysuria, change in color of urine, no urgency or frequency.   flank pain. MS:   joint pain, stiffness, decreased range of motion, back pain. Neuro-     nothing unusual Psych:  change in mood or affect.  depression or anxiety.   memory loss.  OBJ- Physical Exam General- Alert, Oriented, Affect-appropriate, Distress- none acute,  Skin- rash-none, lesions- none, excoriation- none Lymphadenopathy- none Head- atraumatic            Eyes- +strabismus            Ears- Hearing, canals-normal            Nose- Clear, no-Septal dev, mucus, polyps, erosion, perforation             Throat- Mallampati II , mucosa clear , drainage- none, tonsils- atrophic, + teeth Neck- flexible , trachea midline, no stridor , thyroid nl, carotid no bruit Chest - symmetrical excursion , unlabored           Heart/CV- RRR , no murmur , no gallop  , no rub, nl s1 s2                           -  JVD- none , edema- none, stasis changes- none, varices- none           Lung- clear to P&A, wheeze- none, cough- none , dullness-none, rub- none           Chest wall-  Abd-  Br/ Gen/ Rectal- Not done, not indicated Extrem- cyanosis- none, clubbing, none, atrophy- none, strength- nl Neuro- grossly intact to observation

## 2023-08-09 ENCOUNTER — Encounter: Payer: Self-pay | Admitting: Internal Medicine

## 2023-08-09 ENCOUNTER — Ambulatory Visit: Payer: Medicare Other | Admitting: Internal Medicine

## 2023-08-09 VITALS — BP 159/87 | HR 68 | Temp 97.3°F | Ht 63.5 in | Wt 153.0 lb

## 2023-08-09 DIAGNOSIS — F5101 Primary insomnia: Secondary | ICD-10-CM

## 2023-08-09 DIAGNOSIS — G4733 Obstructive sleep apnea (adult) (pediatric): Secondary | ICD-10-CM

## 2023-08-09 DIAGNOSIS — Z23 Encounter for immunization: Secondary | ICD-10-CM

## 2023-08-09 NOTE — Progress Notes (Unsigned)
Rubin Payor, PhD, LAT, ATC acting as a scribe for Clementeen Graham, MD.  Bethany Cole is a 72 y.o. female who presents to [facility] today for L hip pain. Pt was previously seen by Dr. Denyse Amass on 11/10/22 for chronic LBP. Last lumbar ESI was on 02/08/23.  Today, pt c/o L hip pain x 3-wks. Pain has improved some, not as intense. She is also experiencing LBP w/ radiating pain into her L leg. Pt locates pain to the lateral aspect of her L hip. She has been taking her Celebrex at night, which has been helpful.   Dx imaging: 04/26/23 L-spine MRI  10/24/23 L-spine XR  Pertinent review of systems: No fevers or chills  Relevant historical information: Hypertension Osteoporosis  Exam:  BP (!) 162/92   Pulse 67   Ht 5' 3.5" (1.613 m)   Wt 153 lb (69.4 kg)   SpO2 98%   BMI 26.68 kg/m  General: Well Developed, well nourished, and in no acute distress.   MSK: Left hip normal-appearing Normal motion.  Tender palpation greater trochanter.  Hip abduction strength is diminished 4/5.  Lumbar spine normal-appearing normal motion.  Lower extremity strength is intact except for noted above.    Lab and Radiology Results  Procedure: Real-time Ultrasound Guided Injection of left hip greater trochanter bursa Device: Philips Affiniti 50G/GE Logiq Images permanently stored and available for review in PACS Verbal informed consent obtained.  Discussed risks and benefits of procedure. Warned about infection, bleeding, hyperglycemia damage to structures among others. Patient expresses understanding and agreement Time-out conducted.   Noted no overlying erythema, induration, or other signs of local infection.   Skin prepped in a sterile fashion.   Local anesthesia: Topical Ethyl chloride.   With sterile technique and under real time ultrasound guidance: 40 mg of Kenalog and 2 ml of Marcaine injected into bursa. Fluid seen entering the bursa.   Completed without difficulty    Pain immediately resolved suggesting accurate placement of the medication.   Advised to call if fevers/chills, erythema, induration, drainage, or persistent bleeding.   Images permanently stored and available for review in the ultrasound unit.  Impression: Technically successful ultrasound guided injection.    X-ray images left hip obtained today personally and independently interpreted No acute fractures.  No severe degenerative changes. Await formal radiology review     Assessment and Plan: 72 y.o. 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  There may be a lumbar radiculopathy component as well.  Plan for injection and physical therapy.  Check back in about 6 weeks.  Osteoporosis currently treated with Prolia.  She will be due for her 26-month repeat injection December 20.  On recheck in about 6 weeks we can go ahead and draw the labs that I just ordered today.  That will put her in about the right time for an injection December 20-ish.    PDMP not reviewed this encounter. Orders Placed This Encounter  Procedures   [place] LIMITED JOINT SPACE STRUCTURES LOW LEFT(NO LINKED CHARGES)    Order Specific Question:   Reason for Exam (SYMPTOM  OR DIAGNOSIS REQUIRED)    Answer:   left hip pain    Order Specific Question:   Preferred imaging location?    Answer:   Adult nurse [facility]   DG HIP UNILAT W OR W/O PELVIS 2-3 VIEWS LEFT    Standing Status:   Future    Number of Occurrences:  1    Standing Expiration Date:   09/10/2023    Order Specific Question:   Reason for Exam (SYMPTOM  OR DIAGNOSIS REQUIRED)    Answer:   left hip pain    Order Specific Question:   Preferred imaging location?    Answer:   [facility]   Comprehensive metabolic panel    Osteoporis    Standing Status:   Future    Standing Expiration Date:   08/09/2024   Magnesium    Therapeutic drug monitoring    Standing Status:   Future    Standing  Expiration Date:   08/09/2024   Phosphorus    Osteroporis    Standing Status:   Future    Standing Expiration Date:   08/09/2024   VITAMIN D 25 Hydroxy (Vit-D Deficiency, Fractures)    Osteoporis    Standing Status:   Future    Standing Expiration Date:   08/09/2024   Ambulatory referral to Physical Therapy    Referral Priority:   Routine    Referral Type:   Physical Medicine    Referral Reason:   Specialty Services Required    Requested Specialty:   Physical Therapy    Number of Visits Requested:   1   No orders of the defined types were placed in this encounter.    Discussed warning signs or symptoms. Please see discharge instructions. Patient expresses understanding.   The above documentation has been reviewed and is accurate and complete Clementeen Graham, M.D.

## 2023-08-09 NOTE — Patient Instructions (Signed)
Continue CPAP auto 5-15  Try taking the Sonata you have left, at bedtime. See if this works better. If not, let me know and we will try something else.  Order Flu vax senior

## 2023-08-10 ENCOUNTER — Ambulatory Visit: Payer: Medicare Other | Admitting: Family Medicine

## 2023-08-10 ENCOUNTER — Ambulatory Visit (INDEPENDENT_AMBULATORY_CARE_PROVIDER_SITE_OTHER): Payer: Medicare Other

## 2023-08-10 ENCOUNTER — Other Ambulatory Visit: Payer: Self-pay

## 2023-08-10 VITALS — BP 162/92 | HR 67 | Ht 63.5 in | Wt 153.0 lb

## 2023-08-10 DIAGNOSIS — M5441 Lumbago with sciatica, right side: Secondary | ICD-10-CM | POA: Diagnosis not present

## 2023-08-10 DIAGNOSIS — M25551 Pain in right hip: Secondary | ICD-10-CM | POA: Diagnosis not present

## 2023-08-10 DIAGNOSIS — M25552 Pain in left hip: Secondary | ICD-10-CM

## 2023-08-10 DIAGNOSIS — M5442 Lumbago with sciatica, left side: Secondary | ICD-10-CM | POA: Diagnosis not present

## 2023-08-10 DIAGNOSIS — M810 Age-related osteoporosis without current pathological fracture: Secondary | ICD-10-CM

## 2023-08-10 DIAGNOSIS — M7062 Trochanteric bursitis, left hip: Secondary | ICD-10-CM

## 2023-08-10 DIAGNOSIS — G8929 Other chronic pain: Secondary | ICD-10-CM | POA: Diagnosis not present

## 2023-08-10 DIAGNOSIS — M160 Bilateral primary osteoarthritis of hip: Secondary | ICD-10-CM | POA: Diagnosis not present

## 2023-08-10 NOTE — Patient Instructions (Addendum)
Thank you for coming in today.   Please get an Xray today before you leave   I've referred you to Physical Therapy.  Let us know if you don't hear from them in one week.   You received an injection today. Seek immediate medical attention if the joint becomes red, extremely painful, or is oozing fluid.   Please get labs today before you leave   Check back in 6 weeks

## 2023-08-12 ENCOUNTER — Ambulatory Visit (INDEPENDENT_AMBULATORY_CARE_PROVIDER_SITE_OTHER): Payer: Medicare Other | Admitting: *Deleted

## 2023-08-12 DIAGNOSIS — E538 Deficiency of other specified B group vitamins: Secondary | ICD-10-CM | POA: Diagnosis not present

## 2023-08-12 MED ORDER — CYANOCOBALAMIN 1000 MCG/ML IJ SOLN
1000.0000 ug | Freq: Once | INTRAMUSCULAR | Status: AC
Start: 2023-08-12 — End: 2023-08-12
  Administered 2023-08-12: 1000 ug via INTRAMUSCULAR

## 2023-08-12 NOTE — Progress Notes (Signed)
Per orders of Dr. Legrand Como, injection of Cyanocobalamin 1091mg given by FAgnes Lawrence Patient tolerated injection well.

## 2023-08-15 ENCOUNTER — Encounter: Payer: Self-pay | Admitting: Physical Therapy

## 2023-08-15 ENCOUNTER — Ambulatory Visit: Payer: Medicare Other | Admitting: Physical Therapy

## 2023-08-15 DIAGNOSIS — M25551 Pain in right hip: Secondary | ICD-10-CM

## 2023-08-15 DIAGNOSIS — M5459 Other low back pain: Secondary | ICD-10-CM

## 2023-08-15 DIAGNOSIS — M25552 Pain in left hip: Secondary | ICD-10-CM | POA: Diagnosis not present

## 2023-08-15 DIAGNOSIS — M6281 Muscle weakness (generalized): Secondary | ICD-10-CM

## 2023-08-15 NOTE — Therapy (Signed)
OUTPATIENT PHYSICAL THERAPY THORACOLUMBAR EVALUATION   Patient Name: Bethany Cole MRN: [id number] DOB:03/01/1951, 72 y.o., female Today's Date: 08/15/2023  END OF SESSION:  PT End of Session - 08/15/23 1100     Visit Number 1    Number of Visits 16    Date for PT Re-Evaluation 11/07/23    Authorization Type [employer] auth required- auth requested    PT Start Time 1101    PT Stop Time 1151    PT Time Calculation (min) 50 min    Activity Tolerance Patient tolerated treatment well             Past Medical History:  Diagnosis Date   Allergy    Anemia    Anxiety    Arthritis    Cataracts, bilateral    Gastritis    GERD (gastroesophageal reflux disease)    Heart murmur    Hypertension    Hyperthyroidism    OSA on CPAP    Sleep apnea    Squamous cell carcinoma of skin    Past Surgical History:  Procedure Laterality Date   ABDOMINAL HYSTERECTOMY     ENDOMETRIAL ABLATION     EYE SURGERY     HERNIA REPAIR     as an infant   UPPER GASTROINTESTINAL ENDOSCOPY  11/09/2021   Has had sevearal in [place]   WRIST ARTHROSCOPY Right    Patient Active Problem List   Diagnosis Date Noted   Insomnia 06/07/2023   Acute maxillary sinusitis 08/27/2022   Chronic pain of both feet 11/13/2021   Leg length discrepancy 11/13/2021   Vitamin B12 deficiency 09/02/2021   OSA on CPAP    Obesity hypoventilation syndrome (HCC)    Fall 11/17/2020   Hyperlipemia 11/07/2020   Osteoporosis 09/30/2020   Hyperthyroidism    Hypertension    GERD (gastroesophageal reflux disease)    Gastritis    Cataracts, bilateral     PCP: Philip Aspen, Limmie Patricia, MD  REFERRING PROVIDER: Rodolph Bong, MD  REFERRING DIAG: M54.42,M54.41,G89.29 (ICD-10-CM) - Chronic bilateral low back pain with bilateral sciatica M25.551,M25.552 (ICD-10-CM) - Bilateral hip pain  Rationale for Evaluation and Treatment: Rehabilitation  THERAPY DIAG:  Bilateral hip pain  Muscle weakness (generalized)  Other low  back pain  ONSET DATE: summer 2023  SUBJECTIVE:                                                                                                                                                                                           SUBJECTIVE STATEMENT: Pain keeps changing. 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 Pain came on suddenly last year. States she also has stenosis  and a cyst in her low back. State she has never been strong in her core. She feels strength will help with her pain. States she sometimes has arthritis pain and sometimes she has nerve pain. Previous pain was mostly in her right leg. States that now it is also in her left hip. Reports she recently received a left hip injection and that has helped. States she doesn't exercise much but she does walk but it hurts when she walks. She knows the move she holds herself up the less pain she has. Going up slight grades/inclines is challenging and painful.  States she has 2 dogs and they have to be walked separate and she needs to make sure they get their daily walks. Walk totals about an hour.  Some days she can't walk because she is either so exhausted or in too much pain.   Nerve pain can go around the leg into the groin on right but not recently.  Now leg pain is mostly in the lower left leg and not in the groin.    Reports that gardening is too difficult for her to do. Can't do anything where she has to pull or lift (bags of compost).   Has a niece that's 39 years old with CP and not walking - currently crawling - needs to be able to lift/assist her into chair - 60#    PERTINENT HISTORY:  HTN, anxiety, abdominal hysterectomy (hx of 5-6 abdominal/vaginal surgeries for endometriosis), hx right ankle fracture  PAIN:  Are you having pain? Yes: NPRS scale: 6/10 Pain location: low back and hips Pain description: nerve pain, back aching Aggravating factors: walking, no movement, lifting Relieving factors: medication, heat,    PRECAUTIONS: None  RED FLAGS: None   WEIGHT BEARING RESTRICTIONS: No  FALLS:  Has patient fallen in last 6 months? No  LIVING ENVIRONMENT: Lives with: lifelong friend Lives in: House/apartment Stairs: No Has following equipment at home: Grab bars  OCCUPATION: retired, likes to walk, has 2 dogs that she likes to walk  PLOF: Independent, loves to garden   PATIENT GOALS: to be able to walk her dogs regularly and have stronger core    OBJECTIVE:  Note: Objective measures were completed at Evaluation unless otherwise noted.  DIAGNOSTIC FINDINGS:  08/10/23 hip xray - awaiting results  PATIENT SURVEYS:  FOTO 26%  SCREENING FOR RED FLAGS: Bowel or bladder incontinence: No Spinal tumors: No Cauda equina syndrome: No Compression fracture: No Abdominal aneurysm: No  COGNITION: Overall cognitive status: Within functional limits for tasks assessed     SENSATION: Not tested    POSTURE: rounded shoulders, forward head, increased thoracic kyphosis, posterior pelvic tilt, and flexed trunk   PALPATION: No tender spots observed on this date  LUMBAR ROM:   AROM eval  Flexion 75% limited*  Extension 90% limited*  Right lateral flexion 50% limited*  Left lateral flexion 75% limited* - favors flexion  Right rotation   Left rotation    (Blank rows = not tested)    LE Measurements Lower Extremity Right EVAL Left EVAL   A/PROM MMT A/PROM MMT  Hip Flexion 95* 3 95* 3  Hip Extension      Hip Abduction      Hip Adduction      Hip Internal rotation (in supine) *  *   Hip External rotation (in supine) *  *   Knee Flexion  3+*  3+*  Knee Extension  3+*  3+*  Ankle Dorsiflexion  3+*  3+*  Ankle Plantarflexion      Ankle Inversion      Ankle Eversion       (Blank rows = not tested) * pain   LUMBAR SPECIAL TESTS:  Straight leg raise test: + B and Slump test: + B  Breathing Assessment- belly breather- minimal diaphragmatica excursion noted with longer breathes   FUNCTIONAL TESTS:  Holds breath with all transitional movements    TODAY'S TREATMENT:                                                                                                                              DATE:   08/15/2023  Therapeutic Exercise:  Aerobic: Supine: Prone:  Seated:  Standing: Neuromuscular Re-education: long exhale breathing - practice and education on how, breathing with motion Manual Therapy: Therapeutic Activity: Self Care: Trigger Point Dry Needling:  Modalities:    PATIENT EDUCATION:  Education details: on current presentation, on HEP, on clinical outcomes score and POC, on breathing mechanics, rationale behind breathing and how to, on why it is importance to practice purposeful breathing Person educated: Patient Education method: Explanation, Demonstration, and Handouts Education comprehension: verbalized understanding   HOME EXERCISE PROGRAM: Long exhale breathing  ASSESSMENT:  CLINICAL IMPRESSION: Patient presents to physical therapy with chronic low back pain that refers into both legs inconsistently.  Patient with history of multiple abdominal/vaginal surgeries and poor tolerance to hip range of motion and overall interventions on this date.  Educated patient in rationale behind breathing mechanics to help promote core activation during the day.  Tolerated this exercise well and it was added to home exercise program.  Patient presents with limitations in range of motion, strength and posture that are likely contributing to current presentation and would greatly benefit from skilled PT to address physical impairments and improve overall function and quality of life.  OBJECTIVE IMPAIRMENTS: decreased activity tolerance, difficulty walking, decreased ROM, decreased strength, improper body mechanics, postural dysfunction, and pain.   ACTIVITY LIMITATIONS: carrying, lifting, bending, standing, squatting, transfers, bed mobility, and locomotion  level  PARTICIPATION LIMITATIONS: cleaning, community activity, yard work, and walking dogs  PERSONAL FACTORS: Age, Fitness, Time since onset of injury/illness/exacerbation, and 1 comorbidity: multiple abdominal surgeries  are also affecting patient's functional outcome.   REHAB POTENTIAL: Good  CLINICAL DECISION MAKING: Stable/uncomplicated  EVALUATION COMPLEXITY: Low   GOALS: Goals reviewed with patient? yes  SHORT TERM GOALS: Target date: 09/26/2023  Patient will be independent in self management strategies to improve quality of life and functional outcomes. Baseline: New Program Goal status: INITIAL  2.  Patient will report at least 50% improvement in overall symptoms and/or function to demonstrate improved functional mobility Baseline: 0% better Goal status: INITIAL  3.  Patient will be able to regularly walk both dogs without having to limit herself due to pain to return to prior level of function Baseline: Unable Goal status: INITIAL  4.  Patient will demonstrate pain-free lower extremity manual muscle  testing Baseline: Painful Goal status: INITIAL    LONG TERM GOALS: Target date: 11/07/2023   Patient will report at least 75% improvement in overall symptoms and/or function to demonstrate improved functional mobility Baseline: 0% better Goal status: INITIAL  2.  Patient will improve score on FOTO outcomes measure to projected score to demonstrate overall improved function and QOL Baseline: see above Goal status: INITIAL  3.  Patient will be able to demonstrate breathing mechanics with use of obliques to improve core activation during regular tasks Baseline: Unable Goal status: INITIAL  4.  Patient will be able to demonstrate pain-free and lumbar range of motion in all directions. Baseline: Painful Goal status: INITIAL   PLAN:  PT FREQUENCY: 1-2x/week for a total of 16 visits  PT DURATION: 12 weeks  PLANNED INTERVENTIONS: 97110-Therapeutic exercises,  97530- Therapeutic activity, 97112- Neuromuscular re-education, [phone]- Self Care, 95188- Manual therapy, [phone]- Gait training, [phone]- Orthotic Fit/training, [phone]- Canalith repositioning, [id number]- Aquatic Therapy, 97014- Electrical stimulation (unattended), [phone]- Ionotophoresis 4mg /ml Dexamethasone, Patient/Family education, Balance training, Stair training, Taping, Dry Needling, Joint mobilization, Joint manipulation, Spinal manipulation, Spinal mobilization, Cryotherapy, and Moist heat   PLAN FOR NEXT SESSION: core activation with breath work- progress breathing, add in UE for lifting and improved lifting 66 year old niece, Lumbar and hip ROM, isometrics   3:51 PM, 08/15/23 Tereasa Coop, DPT Physical Therapy with [facility]

## 2023-08-17 ENCOUNTER — Ambulatory Visit: Payer: Medicare Other | Admitting: Physical Therapy

## 2023-08-17 ENCOUNTER — Encounter: Payer: Self-pay | Admitting: Physical Therapy

## 2023-08-17 DIAGNOSIS — M25551 Pain in right hip: Secondary | ICD-10-CM

## 2023-08-17 DIAGNOSIS — M5459 Other low back pain: Secondary | ICD-10-CM

## 2023-08-17 DIAGNOSIS — M545 Low back pain, unspecified: Secondary | ICD-10-CM

## 2023-08-17 DIAGNOSIS — M6281 Muscle weakness (generalized): Secondary | ICD-10-CM | POA: Diagnosis not present

## 2023-08-17 DIAGNOSIS — G8929 Other chronic pain: Secondary | ICD-10-CM

## 2023-08-17 DIAGNOSIS — M25552 Pain in left hip: Secondary | ICD-10-CM | POA: Diagnosis not present

## 2023-08-17 NOTE — Therapy (Signed)
OUTPATIENT PHYSICAL THERAPY THORACOLUMBAR TREATMENT   Patient Name: Bethany Cole MRN: [id number] DOB:1951-08-22, 72 y.o., female Today's Date: 08/17/2023  END OF SESSION:  PT End of Session - 08/17/23 0933     Visit Number 2    Number of Visits 16    Date for PT Re-Evaluation 11/07/23    Authorization Type U[employer] auth required- auth requested    PT Start Time 0934    PT Stop Time 1014    PT Time Calculation (min) 40 min    Activity Tolerance Patient tolerated treatment well             Past Medical History:  Diagnosis Date   Allergy    Anemia    Anxiety    Arthritis    Cataracts, bilateral    Gastritis    GERD (gastroesophageal reflux disease)    Heart murmur    Hypertension    Hyperthyroidism    OSA on CPAP    Sleep apnea    Squamous cell carcinoma of skin    Past Surgical History:  Procedure Laterality Date   ABDOMINAL HYSTERECTOMY     ENDOMETRIAL ABLATION     EYE SURGERY     HERNIA REPAIR     as an infant   UPPER GASTROINTESTINAL ENDOSCOPY  11/09/2021   Has had sevearal in [place]   WRIST ARTHROSCOPY Right    Patient Active Problem List   Diagnosis Date Noted   Insomnia 06/07/2023   Acute maxillary sinusitis 08/27/2022   Chronic pain of both feet 11/13/2021   Leg length discrepancy 11/13/2021   Vitamin B12 deficiency 09/02/2021   OSA on CPAP    Obesity hypoventilation syndrome (HCC)    Fall 11/17/2020   Hyperlipemia 11/07/2020   Osteoporosis 09/30/2020   Hyperthyroidism    Hypertension    GERD (gastroesophageal reflux disease)    Gastritis    Cataracts, bilateral     PCP: Philip Aspen, Limmie Patricia, MD  REFERRING PROVIDER: Rodolph Bong, MD  REFERRING DIAG: M54.42,M54.41,G89.29 (ICD-10-CM) - Chronic bilateral low back pain with bilateral sciatica M25.551,M25.552 (ICD-10-CM) - Bilateral hip pain  Rationale for Evaluation and Treatment: Rehabilitation  THERAPY DIAG:  Bilateral hip pain  Muscle weakness (generalized)  Other low  back pain  Chronic midline low back pain without sciatica  Pain in right hip  ONSET DATE: summer 2023  SUBJECTIVE:                                                                                                                                                                                           SUBJECTIVE STATEMENT: 08/17/2023 Reports a little pain in  her left leg today. States she is helping her niece tomorrow.   Eval: Pain keeps changing. 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 Pain came on suddenly last year. States she also has stenosis and a cyst in her low back. State she has never been strong in her core. She feels strength will help with her pain. States she sometimes has arthritis pain and sometimes she has nerve pain. Previous pain was mostly in her right leg. States that now it is also in her left hip. Reports she recently received a left hip injection and that has helped. States she doesn't exercise much but she does walk but it hurts when she walks. She knows the move she holds herself up the less pain she has. Going up slight grades/inclines is challenging and painful.  States she has 2 dogs and they have to be walked separate and she needs to make sure they get their daily walks. Walk totals about an hour.  Some days she can't walk because she is either so exhausted or in too much pain.   Nerve pain can go around the leg into the groin on right but not recently.  Now leg pain is mostly in the lower left leg and not in the groin.    Reports that gardening is too difficult for her to do. Can't do anything where she has to pull or lift (bags of compost).   Has a niece that's 16 years old with CP and not walking - currently crawling - needs to be able to lift/assist her into chair - 60#    PERTINENT HISTORY:  HTN, anxiety, abdominal hysterectomy (hx of 5-6 abdominal/vaginal surgeries for endometriosis), hx right ankle fracture  PAIN:  Are you having pain? Yes: NPRS scale:  6/10 Pain location: left leg Pain description: nerve pain, back aching Aggravating factors: walking, no movement, lifting Relieving factors: medication, heat,   PRECAUTIONS: None  RED FLAGS: None   WEIGHT BEARING RESTRICTIONS: No  FALLS:  Has patient fallen in last 6 months? No  LIVING ENVIRONMENT: Lives with: lifelong friend Lives in: House/apartment Stairs: No Has following equipment at home: Grab bars  OCCUPATION: retired, likes to walk, has 2 dogs that she likes to walk  PLOF: Independent, loves to garden   PATIENT GOALS: to be able to walk her dogs regularly and have stronger core    OBJECTIVE:  Note: Objective measures were completed at Evaluation unless otherwise noted.  DIAGNOSTIC FINDINGS:  08/10/23 hip xray - awaiting results  PATIENT SURVEYS:  FOTO 26%  SCREENING FOR RED FLAGS: Bowel or bladder incontinence: No Spinal tumors: No Cauda equina syndrome: No Compression fracture: No Abdominal aneurysm: No  COGNITION: Overall cognitive status: Within functional limits for tasks assessed     SENSATION: Not tested    POSTURE: rounded shoulders, forward head, increased thoracic kyphosis, posterior pelvic tilt, and flexed trunk   PALPATION: No tender spots observed on this date  LUMBAR ROM:   AROM eval  Flexion 75% limited*  Extension 90% limited*  Right lateral flexion 50% limited*  Left lateral flexion 75% limited* - favors flexion  Right rotation   Left rotation    (Blank rows = not tested)    LE Measurements Lower Extremity Right EVAL Left EVAL   A/PROM MMT A/PROM MMT  Hip Flexion 95* 3 95* 3  Hip Extension      Hip Abduction      Hip Adduction      Hip Internal rotation (in supine) *  *  Hip External rotation (in supine) *  *   Knee Flexion  3+*  3+*  Knee Extension  3+*  3+*  Ankle Dorsiflexion  3+*  3+*  Ankle Plantarflexion      Ankle Inversion      Ankle Eversion       (Blank rows = not tested) * pain   LUMBAR  SPECIAL TESTS:  Straight leg raise test: + B and Slump test: + B  Breathing Assessment- belly breather- minimal diaphragmatica excursion noted with longer breathes  FUNCTIONAL TESTS:  Holds breath with all transitional movements    TODAY'S TREATMENT:                                                                                                                              DATE:   08/17/2023  Therapeutic Exercise:  Aerobic: Supine: Prone:  Seated:  Standing: Neuromuscular Re-education: long exhale breathing - practice and education on how, breathing with motion, practiced sitting and awareness of sit bones and pelvic tilts with sit bone focus - 20 minutes total Manual Therapy: Therapeutic Activity: lifting niece - mechanics and practice 19 minutes see education  Self Care: Trigger Point Dry Needling:  Modalities:    PATIENT EDUCATION:  Education details: on HEP, on lifting strategies to help take care of her niece tomorrow - strategies for different ways to reduce stress on spine and stabilize wheelchair  Person educated: Patient Education method: Explanation, Demonstration, and Handouts Education comprehension: verbalized understanding   HOME EXERCISE PROGRAM: Long exhale breathing, [id number]  ASSESSMENT:  CLINICAL IMPRESSION: 08/17/2023 Session focused on answering all questions and strategizing ways to reduce stress on lumbar spine and legs while assisting niece with wheelchair transfer from floor.  Discussed utilizing wall support so chair can be mobilized and patient does not have to hold the chair steady while assisting her niece.  Discussed patient getting a video of this transfer to help continue to strategize ways to reduce stress on back and leg.  Reviewed breathing exercises which were tolerated well and progressed with pelvic tilts.  Very challenging for patient to perform anterior pelvic tilt so primarily focused on posterior pelvic tilt.  According to patient with  ischial tuberosity/sit bones to help direct this movement.  Reduced pain noted end of session and added posterior pelvic tilt to home exercise program.  Will continue with current plan of care as tolerated.  EVAL: Patient presents to physical therapy with chronic low back pain that refers into both legs inconsistently.  Patient with history of multiple abdominal/vaginal surgeries and poor tolerance to hip range of motion and overall interventions on this date.  Educated patient in rationale behind breathing mechanics to help promote core activation during the day.  Tolerated this exercise well and it was added to home exercise program.  Patient presents with limitations in range of motion, strength and posture that are likely contributing to current presentation and would greatly benefit from skilled PT to  address physical impairments and improve overall function and quality of life.  OBJECTIVE IMPAIRMENTS: decreased activity tolerance, difficulty walking, decreased ROM, decreased strength, improper body mechanics, postural dysfunction, and pain.   ACTIVITY LIMITATIONS: carrying, lifting, bending, standing, squatting, transfers, bed mobility, and locomotion level  PARTICIPATION LIMITATIONS: cleaning, community activity, yard work, and walking dogs  PERSONAL FACTORS: Age, Fitness, Time since onset of injury/illness/exacerbation, and 1 comorbidity: multiple abdominal surgeries  are also affecting patient's functional outcome.   REHAB POTENTIAL: Good  CLINICAL DECISION MAKING: Stable/uncomplicated  EVALUATION COMPLEXITY: Low   GOALS: Goals reviewed with patient? yes  SHORT TERM GOALS: Target date: 09/26/2023  Patient will be independent in self management strategies to improve quality of life and functional outcomes. Baseline: New Program Goal status: INITIAL  2.  Patient will report at least 50% improvement in overall symptoms and/or function to demonstrate improved functional  mobility Baseline: 0% better Goal status: INITIAL  3.  Patient will be able to regularly walk both dogs without having to limit herself due to pain to return to prior level of function Baseline: Unable Goal status: INITIAL  4.  Patient will demonstrate pain-free lower extremity manual muscle testing Baseline: Painful Goal status: INITIAL    LONG TERM GOALS: Target date: 11/07/2023   Patient will report at least 75% improvement in overall symptoms and/or function to demonstrate improved functional mobility Baseline: 0% better Goal status: INITIAL  2.  Patient will improve score on FOTO outcomes measure to projected score to demonstrate overall improved function and QOL Baseline: see above Goal status: INITIAL  3.  Patient will be able to demonstrate breathing mechanics with use of obliques to improve core activation during regular tasks Baseline: Unable Goal status: INITIAL  4.  Patient will be able to demonstrate pain-free and lumbar range of motion in all directions. Baseline: Painful Goal status: INITIAL   PLAN:  PT FREQUENCY: 1-2x/week for a total of 16 visits  PT DURATION: 12 weeks  PLANNED INTERVENTIONS: 97110-Therapeutic exercises, 97530- Therapeutic activity, 97112- Neuromuscular re-education, [phone]- Self Care, 19147- Manual therapy, [phone]- Gait training, [phone]- Orthotic Fit/training, [phone]- Canalith repositioning, [id number]- Aquatic Therapy, 97014- Electrical stimulation (unattended), [phone]- Ionotophoresis 4mg /ml Dexamethasone, Patient/Family education, Balance training, Stair training, Taping, Dry Needling, Joint mobilization, Joint manipulation, Spinal manipulation, Spinal mobilization, Cryotherapy, and Moist heat   PLAN FOR NEXT SESSION: core activation with breath work- progress breathing, add in UE for lifting and improved lifting 54 year old niece, Lumbar and hip ROM, isometrics   10:18 AM, 08/17/23 Tereasa Coop, DPT Physical Therapy with [facility]

## 2023-08-22 ENCOUNTER — Ambulatory Visit: Payer: Medicare Other | Admitting: Physical Therapy

## 2023-08-22 ENCOUNTER — Encounter: Payer: Self-pay | Admitting: Physical Therapy

## 2023-08-22 DIAGNOSIS — M6281 Muscle weakness (generalized): Secondary | ICD-10-CM | POA: Diagnosis not present

## 2023-08-22 DIAGNOSIS — M25552 Pain in left hip: Secondary | ICD-10-CM

## 2023-08-22 DIAGNOSIS — M25551 Pain in right hip: Secondary | ICD-10-CM

## 2023-08-22 DIAGNOSIS — G8929 Other chronic pain: Secondary | ICD-10-CM | POA: Diagnosis not present

## 2023-08-22 DIAGNOSIS — M5459 Other low back pain: Secondary | ICD-10-CM | POA: Diagnosis not present

## 2023-08-22 DIAGNOSIS — M545 Low back pain, unspecified: Secondary | ICD-10-CM

## 2023-08-22 NOTE — Therapy (Signed)
OUTPATIENT PHYSICAL THERAPY THORACOLUMBAR TREATMENT   Patient Name: Bethany Cole MRN: [id number] DOB:January 15, 1951, 72 y.o., female Today's Date: 08/22/2023  END OF SESSION:  PT End of Session - 08/22/23 0934     Visit Number 3    Number of Visits 16    Date for PT Re-Evaluation 11/07/23    Authorization Type [employer] auth required- auth requested    PT Start Time 0935    PT Stop Time 1013    PT Time Calculation (min) 38 min    Activity Tolerance Patient tolerated treatment well             Past Medical History:  Diagnosis Date   Allergy    Anemia    Anxiety    Arthritis    Cataracts, bilateral    Gastritis    GERD (gastroesophageal reflux disease)    Heart murmur    Hypertension    Hyperthyroidism    OSA on CPAP    Sleep apnea    Squamous cell carcinoma of skin    Past Surgical History:  Procedure Laterality Date   ABDOMINAL HYSTERECTOMY     ENDOMETRIAL ABLATION     EYE SURGERY     HERNIA REPAIR     as an infant   UPPER GASTROINTESTINAL ENDOSCOPY  11/09/2021   Has had sevearal in [place]   WRIST ARTHROSCOPY Right    Patient Active Problem List   Diagnosis Date Noted   Insomnia 06/07/2023   Acute maxillary sinusitis 08/27/2022   Chronic pain of both feet 11/13/2021   Leg length discrepancy 11/13/2021   Vitamin B12 deficiency 09/02/2021   OSA on CPAP    Obesity hypoventilation syndrome (HCC)    Fall 11/17/2020   Hyperlipemia 11/07/2020   Osteoporosis 09/30/2020   Hyperthyroidism    Hypertension    GERD (gastroesophageal reflux disease)    Gastritis    Cataracts, bilateral     PCP: Philip Aspen, Limmie Patricia, MD  REFERRING PROVIDER: Rodolph Bong, MD  REFERRING DIAG: M54.42,M54.41,G89.29 (ICD-10-CM) - Chronic bilateral low back pain with bilateral sciatica M25.551,M25.552 (ICD-10-CM) - Bilateral hip pain  Rationale for Evaluation and Treatment: Rehabilitation  THERAPY DIAG:  Bilateral hip pain  Muscle weakness (generalized)  Other low  back pain  Chronic midline low back pain without sciatica  ONSET DATE: summer 2023  SUBJECTIVE:                                                                                                                                                                                           SUBJECTIVE STATEMENT: 08/22/2023 States she has figured out how to transfer her niece  from the second to lowest step to reduce the stress on her and she didn't have to lift her at all. States that was on Saturday she had terrible pain in her left hip and it started in the AM  and got progressively worse. States she took Saturday off from her exercises.   Reports her pain is better but is was hurting in her left hip and femur bone and on Sunday she had no pain. Driving aggravates it  Eval: Pain keeps changing. 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 Pain came on suddenly last year. States she also has stenosis and a cyst in her low back. State she has never been strong in her core. She feels strength will help with her pain. States she sometimes has arthritis pain and sometimes she has nerve pain. Previous pain was mostly in her right leg. States that now it is also in her left hip. Reports she recently received a left hip injection and that has helped. States she doesn't exercise much but she does walk but it hurts when she walks. She knows the move she holds herself up the less pain she has. Going up slight grades/inclines is challenging and painful.  States she has 2 dogs and they have to be walked separate and she needs to make sure they get their daily walks. Walk totals about an hour.  Some days she can't walk because she is either so exhausted or in too much pain.   Nerve pain can go around the leg into the groin on right but not recently.  Now leg pain is mostly in the lower left leg and not in the groin.    Reports that gardening is too difficult for her to do. Can't do anything where she has to pull or lift (bags  of compost).   Has a niece that's 18 years old with CP and not walking - currently crawling - needs to be able to lift/assist her into chair - 60#    PERTINENT HISTORY:  HTN, anxiety, abdominal hysterectomy (hx of 5-6 abdominal/vaginal surgeries for endometriosis), hx right ankle fracture  PAIN:  Are you having pain? Yes: NPRS scale: 4/10 Pain location: B legs and frotn pelvis  Pain description: nerve pain, back aching Aggravating factors: walking, no movement, lifting Relieving factors: medication, heat,   PRECAUTIONS: None  RED FLAGS: None   WEIGHT BEARING RESTRICTIONS: No  FALLS:  Has patient fallen in last 6 months? No  LIVING ENVIRONMENT: Lives with: lifelong friend Lives in: House/apartment Stairs: No Has following equipment at home: Grab bars  OCCUPATION: retired, likes to walk, has 2 dogs that she likes to walk  PLOF: Independent, loves to garden   PATIENT GOALS: to be able to walk her dogs regularly and have stronger core    OBJECTIVE:  Note: Objective measures were completed at Evaluation unless otherwise noted.  DIAGNOSTIC FINDINGS:  08/10/23 hip xray - awaiting results  PATIENT SURVEYS:  FOTO 26%  SCREENING FOR RED FLAGS: Bowel or bladder incontinence: No Spinal tumors: No Cauda equina syndrome: No Compression fracture: No Abdominal aneurysm: No  COGNITION: Overall cognitive status: Within functional limits for tasks assessed     SENSATION: Not tested    POSTURE: rounded shoulders, forward head, increased thoracic kyphosis, posterior pelvic tilt, and flexed trunk   PALPATION: No tender spots observed on this date  LUMBAR ROM:   AROM eval  Flexion 75% limited*  Extension 90% limited*  Right lateral flexion 50% limited*  Left lateral flexion 75%  limited* - favors flexion  Right rotation   Left rotation    (Blank rows = not tested)    LE Measurements Lower Extremity Right EVAL Left EVAL   A/PROM MMT A/PROM MMT  Hip Flexion  95* 3 95* 3  Hip Extension      Hip Abduction      Hip Adduction      Hip Internal rotation (in prone) 60*  60*   Hip External rotation (in prone) 30*  10*   Knee Flexion  3+*  3+*  Knee Extension  3+*  3+*  Ankle Dorsiflexion  3+*  3+*  Ankle Plantarflexion      Ankle Inversion      Ankle Eversion       (Blank rows = not tested) * pain   LUMBAR SPECIAL TESTS:  Straight leg raise test: + B and Slump test: + B  Breathing Assessment- belly breather- minimal diaphragmatica excursion noted with longer breathes  FUNCTIONAL TESTS:  Holds breath with all transitional movements    TODAY'S TREATMENT:                                                                                                                              DATE:   08/22/2023  Therapeutic Exercise:  Review of HEP and lifting mechanics.: Supine: hip add/abd iso - 2 minutes each Prone: hamstring curls x20 5" holds. Lying on 2 pillows 5 minutes  Seated:  Standing: Neuromuscular Re-education:   Manual Therapy: IASTM - with percussion gun to left hip/pelvis - tolerated well. STM to left glute med - tolreated well  Therapeutic Activity:     Self Care: Trigger Point Dry Needling:  Modalities:    PATIENT EDUCATION:  Education details: on HEP, on lifting strategies to help take care of her niece tomorrow - strategies for different ways to reduce stress on spine and stabilize wheelchair  Person educated: Patient Education method: Explanation, Demonstration, and Handouts Education comprehension: verbalized understanding   HOME EXERCISE PROGRAM: Long exhale breathing, [id number]  ASSESSMENT:  CLINICAL IMPRESSION: 08/22/2023 Session focused on addressing hip pain and progress exercises as tolerated. Tolerated percussion gun vibration well and reported improved muscle tension afterwards. Added hip isometrics to HEP which were also tolerated well. Overall patient doing well and would continue to benefit from skilled PT.    EVAL: Patient presents to physical therapy with chronic low back pain that refers into both legs inconsistently.  Patient with history of multiple abdominal/vaginal surgeries and poor tolerance to hip range of motion and overall interventions on this date.  Educated patient in rationale behind breathing mechanics to help promote core activation during the day.  Tolerated this exercise well and it was added to home exercise program.  Patient presents with limitations in range of motion, strength and posture that are likely contributing to current presentation and would greatly benefit from skilled PT to address physical impairments and improve overall function and quality of life.  OBJECTIVE IMPAIRMENTS: decreased activity tolerance, difficulty walking, decreased ROM, decreased strength, improper body mechanics, postural dysfunction, and pain.   ACTIVITY LIMITATIONS: carrying, lifting, bending, standing, squatting, transfers, bed mobility, and locomotion level  PARTICIPATION LIMITATIONS: cleaning, community activity, yard work, and walking dogs  PERSONAL FACTORS: Age, Fitness, Time since onset of injury/illness/exacerbation, and 1 comorbidity: multiple abdominal surgeries  are also affecting patient's functional outcome.   REHAB POTENTIAL: Good  CLINICAL DECISION MAKING: Stable/uncomplicated  EVALUATION COMPLEXITY: Low   GOALS: Goals reviewed with patient? yes  SHORT TERM GOALS: Target date: 09/26/2023  Patient will be independent in self management strategies to improve quality of life and functional outcomes. Baseline: New Program Goal status: INITIAL  2.  Patient will report at least 50% improvement in overall symptoms and/or function to demonstrate improved functional mobility Baseline: 0% better Goal status: INITIAL  3.  Patient will be able to regularly walk both dogs without having to limit herself due to pain to return to prior level of function Baseline: Unable Goal status:  INITIAL  4.  Patient will demonstrate pain-free lower extremity manual muscle testing Baseline: Painful Goal status: INITIAL    LONG TERM GOALS: Target date: 11/07/2023   Patient will report at least 75% improvement in overall symptoms and/or function to demonstrate improved functional mobility Baseline: 0% better Goal status: INITIAL  2.  Patient will improve score on FOTO outcomes measure to projected score to demonstrate overall improved function and QOL Baseline: see above Goal status: INITIAL  3.  Patient will be able to demonstrate breathing mechanics with use of obliques to improve core activation during regular tasks Baseline: Unable Goal status: INITIAL  4.  Patient will be able to demonstrate pain-free and lumbar range of motion in all directions. Baseline: Painful Goal status: INITIAL   PLAN:  PT FREQUENCY: 1-2x/week for a total of 16 visits  PT DURATION: 12 weeks  PLANNED INTERVENTIONS: 97110-Therapeutic exercises, 97530- Therapeutic activity, 97112- Neuromuscular re-education, [phone]- Self Care, 96295- Manual therapy, [phone]- Gait training, [phone]- Orthotic Fit/training, [phone]- Canalith repositioning, [id number]- Aquatic Therapy, 97014- Electrical stimulation (unattended), [phone]- Ionotophoresis 4mg /ml Dexamethasone, Patient/Family education, Balance training, Stair training, Taping, Dry Needling, Joint mobilization, Joint manipulation, Spinal manipulation, Spinal mobilization, Cryotherapy, and Moist heat   PLAN FOR NEXT SESSION: review sit bones-percussion gun, SIJ, breathing exercises  score activation with breath work- progress breathing, add in UE for lifting and improved lifting 49 year old niece, Lumbar and hip ROM, isometrics   10:40 AM, 08/22/23 Tereasa Coop, DPT Physical Therapy with [facility]

## 2023-08-23 ENCOUNTER — Encounter: Payer: Self-pay | Admitting: Family Medicine

## 2023-08-24 ENCOUNTER — Ambulatory Visit (INDEPENDENT_AMBULATORY_CARE_PROVIDER_SITE_OTHER): Payer: Medicare Other | Admitting: Family Medicine

## 2023-08-24 ENCOUNTER — Encounter: Payer: Medicare Other | Admitting: Physical Therapy

## 2023-08-24 ENCOUNTER — Encounter: Payer: Self-pay | Admitting: Family Medicine

## 2023-08-24 VITALS — BP 122/76 | HR 90 | Temp 98.2°F | Wt 152.4 lb

## 2023-08-24 DIAGNOSIS — J019 Acute sinusitis, unspecified: Secondary | ICD-10-CM | POA: Diagnosis not present

## 2023-08-24 MED ORDER — CEFUROXIME AXETIL 500 MG PO TABS: 500.0000 mg | ORAL_TABLET | Freq: Two times a day (BID) | ORAL | 0 refills | Status: AC

## 2023-08-24 NOTE — Progress Notes (Signed)
Subjective:    Patient ID: Bethany Cole, female    DOB: 17-May-1951, 72 y.o.   MRN: [id number]  HPI Here for 10 days of sinus pressure, PND, and a dry cough. No fever or SOB.    Review of Systems  Constitutional: Negative.   HENT:  Positive for congestion, postnasal drip and sinus pressure. Negative for ear pain and sore throat.   Eyes: Negative.   Respiratory:  Positive for cough. Negative for shortness of breath and wheezing.        Objective:   Physical Exam Constitutional:      Appearance: Normal appearance. She is not ill-appearing.  HENT:     Right Ear: Tympanic membrane, ear canal and external ear normal.     Left Ear: Tympanic membrane, ear canal and external ear normal.     Mouth/Throat:     Pharynx: Oropharynx is clear.  Eyes:     Conjunctiva/sclera: Conjunctivae normal.  Pulmonary:     Effort: Pulmonary effort is normal.     Breath sounds: Normal breath sounds.  Lymphadenopathy:     Cervical: No cervical adenopathy.  Neurological:     Mental Status: She is alert.           Assessment & Plan:  Sinusitis, treat with 10 days of Cefuroxime.  Gershon Crane, MD

## 2023-08-25 DIAGNOSIS — D225 Melanocytic nevi of trunk: Secondary | ICD-10-CM | POA: Diagnosis not present

## 2023-08-25 DIAGNOSIS — L821 Other seborrheic keratosis: Secondary | ICD-10-CM | POA: Diagnosis not present

## 2023-08-25 DIAGNOSIS — D2261 Melanocytic nevi of right upper limb, including shoulder: Secondary | ICD-10-CM | POA: Diagnosis not present

## 2023-08-25 DIAGNOSIS — L918 Other hypertrophic disorders of the skin: Secondary | ICD-10-CM | POA: Diagnosis not present

## 2023-08-25 DIAGNOSIS — D692 Other nonthrombocytopenic purpura: Secondary | ICD-10-CM | POA: Diagnosis not present

## 2023-08-25 DIAGNOSIS — L649 Androgenic alopecia, unspecified: Secondary | ICD-10-CM | POA: Diagnosis not present

## 2023-08-25 DIAGNOSIS — D2262 Melanocytic nevi of left upper limb, including shoulder: Secondary | ICD-10-CM | POA: Diagnosis not present

## 2023-08-25 DIAGNOSIS — D1801 Hemangioma of skin and subcutaneous tissue: Secondary | ICD-10-CM | POA: Diagnosis not present

## 2023-08-25 DIAGNOSIS — L570 Actinic keratosis: Secondary | ICD-10-CM | POA: Diagnosis not present

## 2023-08-25 DIAGNOSIS — B353 Tinea pedis: Secondary | ICD-10-CM | POA: Diagnosis not present

## 2023-08-25 DIAGNOSIS — I8392 Asymptomatic varicose veins of left lower extremity: Secondary | ICD-10-CM | POA: Diagnosis not present

## 2023-08-25 DIAGNOSIS — L720 Epidermal cyst: Secondary | ICD-10-CM | POA: Diagnosis not present

## 2023-08-25 NOTE — Progress Notes (Signed)
Left hip x-ray shows mild arthritis on both sides.  This is similar to what it looked like in 2022.

## 2023-08-28 ENCOUNTER — Encounter: Payer: Self-pay | Admitting: Internal Medicine

## 2023-08-28 NOTE — Assessment & Plan Note (Signed)
Benefits from CPAP with satisfactory compliance and control. Plan-continue auto 5-15

## 2023-08-28 NOTE — Assessment & Plan Note (Signed)
Waking after sleep onset Plan- try taking remaining Sonata at bedtime.

## 2023-08-29 ENCOUNTER — Encounter: Payer: Self-pay | Admitting: Physical Therapy

## 2023-08-29 ENCOUNTER — Ambulatory Visit: Payer: Medicare Other | Admitting: Physical Therapy

## 2023-08-29 ENCOUNTER — Other Ambulatory Visit: Payer: Self-pay | Admitting: Internal Medicine

## 2023-08-29 DIAGNOSIS — M5459 Other low back pain: Secondary | ICD-10-CM | POA: Diagnosis not present

## 2023-08-29 DIAGNOSIS — M25551 Pain in right hip: Secondary | ICD-10-CM

## 2023-08-29 DIAGNOSIS — M25552 Pain in left hip: Secondary | ICD-10-CM | POA: Diagnosis not present

## 2023-08-29 DIAGNOSIS — M6281 Muscle weakness (generalized): Secondary | ICD-10-CM

## 2023-08-29 DIAGNOSIS — I10 Essential (primary) hypertension: Secondary | ICD-10-CM

## 2023-08-29 NOTE — Therapy (Signed)
OUTPATIENT PHYSICAL THERAPY THORACOLUMBAR TREATMENT   Patient Name: Paisli Silfies MRN: [id number] DOB:04/15/1951, 72 y.o., female Today's Date: 08/29/2023  END OF SESSION:  PT End of Session - 08/29/23 0933     Visit Number 4    Number of Visits 16    Date for PT Re-Evaluation 11/07/23    Authorization Type [employer] auth required- auth requested    PT Start Time 0933    PT Stop Time 1011    PT Time Calculation (min) 38 min    Activity Tolerance Patient tolerated treatment well;No increased pain    Behavior During Therapy WFL for tasks assessed/performed              Past Medical History:  Diagnosis Date   Allergy    Anemia    Anxiety    Arthritis    Cataracts, bilateral    Gastritis    GERD (gastroesophageal reflux disease)    Heart murmur    Hypertension    Hyperthyroidism    OSA on CPAP    Sleep apnea    Squamous cell carcinoma of skin    Past Surgical History:  Procedure Laterality Date   ABDOMINAL HYSTERECTOMY     ENDOMETRIAL ABLATION     EYE SURGERY     HERNIA REPAIR     as an infant   UPPER GASTROINTESTINAL ENDOSCOPY  11/09/2021   Has had sevearal in [place]   WRIST ARTHROSCOPY Right    Patient Active Problem List   Diagnosis Date Noted   Insomnia 06/07/2023   Acute maxillary sinusitis 08/27/2022   Chronic pain of both feet 11/13/2021   Leg length discrepancy 11/13/2021   Vitamin B12 deficiency 09/02/2021   OSA on CPAP    Obesity hypoventilation syndrome (HCC)    Fall 11/17/2020   Hyperlipemia 11/07/2020   Osteoporosis 09/30/2020   Hyperthyroidism    Hypertension    GERD (gastroesophageal reflux disease)    Gastritis    Cataracts, bilateral     PCP: Philip Aspen, Limmie Patricia, MD  REFERRING PROVIDER: Rodolph Bong, MD  REFERRING DIAG: M54.42,M54.41,G89.29 (ICD-10-CM) - Chronic bilateral low back pain with bilateral sciatica M25.551,M25.552 (ICD-10-CM) - Bilateral hip pain  Rationale for Evaluation and Treatment:  Rehabilitation  THERAPY DIAG:  Bilateral hip pain  Muscle weakness (generalized)  Other low back pain  ONSET DATE: summer 2023  SUBJECTIVE:                                                                                                                                                                                           SUBJECTIVE STATEMENT: 08/29/2023  Feeling pretty good today,  I was able to do some yard work the other day picking up heavy things, no increased pain. Working on exercises. I wanted to clarify that the one exercise where I expand my legs out laying my back, that that is done with resistance band? I don't have that I've been using a belt. I'm walking every day but only a mile have markers so I can progress myself over time. Still getting a lot of pain in my hips when I first wake up but movement has been helping.   Eval: Pain keeps changing. 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 Pain came on suddenly last year. States she also has stenosis and a cyst in her low back. State she has never been strong in her core. She feels strength will help with her pain. States she sometimes has arthritis pain and sometimes she has nerve pain. Previous pain was mostly in her right leg. States that now it is also in her left hip. Reports she recently received a left hip injection and that has helped. States she doesn't exercise much but she does walk but it hurts when she walks. She knows the move she holds herself up the less pain she has. Going up slight grades/inclines is challenging and painful.  States she has 2 dogs and they have to be walked separate and she needs to make sure they get their daily walks. Walk totals about an hour.  Some days she can't walk because she is either so exhausted or in too much pain.   Nerve pain can go around the leg into the groin on right but not recently.  Now leg pain is mostly in the lower left leg and not in the groin.    Reports that gardening is too  difficult for her to do. Can't do anything where she has to pull or lift (bags of compost).   Has a niece that's 10 years old with CP and not walking - currently crawling - needs to be able to lift/assist her into chair - 60#    PERTINENT HISTORY:  HTN, anxiety, abdominal hysterectomy (hx of 5-6 abdominal/vaginal surgeries for endometriosis), hx right ankle fracture  PAIN:  Are you having pain? Yes: NPRS scale: 5/10 Pain location: low back and hips Pain description: irritating, fatiguing   Aggravating factors: gets worse with the day (fatigue), first getting up in the morning, doing too much and moving things  Relieving factors: medication, heat  PRECAUTIONS: None  RED FLAGS: None   WEIGHT BEARING RESTRICTIONS: No  FALLS:  Has patient fallen in last 6 months? No  LIVING ENVIRONMENT: Lives with: lifelong friend Lives in: House/apartment Stairs: No Has following equipment at home: Grab bars  OCCUPATION: retired, likes to walk, has 2 dogs that she likes to walk  PLOF: Independent, loves to garden   PATIENT GOALS: to be able to walk her dogs regularly and have stronger core    OBJECTIVE:  Note: Objective measures were completed at Evaluation unless otherwise noted.  DIAGNOSTIC FINDINGS:  08/10/23 hip xray - awaiting results  PATIENT SURVEYS:  FOTO 26%  SCREENING FOR RED FLAGS: Bowel or bladder incontinence: No Spinal tumors: No Cauda equina syndrome: No Compression fracture: No Abdominal aneurysm: No  COGNITION: Overall cognitive status: Within functional limits for tasks assessed     SENSATION: Not tested    POSTURE: rounded shoulders, forward head, increased thoracic kyphosis, posterior pelvic tilt, and flexed trunk   PALPATION: No tender spots observed on this date  LUMBAR ROM:  AROM eval  Flexion 75% limited*  Extension 90% limited*  Right lateral flexion 50% limited*  Left lateral flexion 75% limited* - favors flexion  Right rotation    Left rotation    (Blank rows = not tested)    LE Measurements Lower Extremity Right EVAL Left EVAL   A/PROM MMT A/PROM MMT  Hip Flexion 95* 3 95* 3  Hip Extension      Hip Abduction      Hip Adduction      Hip Internal rotation (in prone) 60*  60*   Hip External rotation (in prone) 30*  10*   Knee Flexion  3+*  3+*  Knee Extension  3+*  3+*  Ankle Dorsiflexion  3+*  3+*  Ankle Plantarflexion      Ankle Inversion      Ankle Eversion       (Blank rows = not tested) * pain   LUMBAR SPECIAL TESTS:  Straight leg raise test: + B and Slump test: + B  Breathing Assessment- belly breather- minimal diaphragmatica excursion noted with longer breathes  FUNCTIONAL TESTS:  Holds breath with all transitional movements    TODAY'S TREATMENT:                                                                                                                              DATE:   08/29/2023  Therapeutic Exercise:   Supine: hooklying TA sets 15x3 second holds, SKTC 6x3 seconds B, lumbar rotation stretch 6x5 seconds B , supine clams red TB x10 , bridges x10, PPT 15x3 second holds, PPT + march x10 B  Prone:    Seated:  Standing: Neuromuscular Re-education:   Manual Therapy:  Therapeutic Activity:     Self Care: Trigger Point Dry Needling:  Modalities: MHP to lx spine x10 minutes during some supine exercises (supine)      PATIENT EDUCATION:  Education details: added red TB to HEP, possible DOMS after progressions today  Person educated: Patient Education method: Explanation, Demonstration, and Handouts Education comprehension: verbalized understanding   HOME EXERCISE PROGRAM: Long exhale breathing, [email or web address] Added red TB for supine clams 08/29/23  ASSESSMENT:  CLINICAL IMPRESSION: 08/29/2023  Pt arrives feeling better, sounds like she is starting to have increased activity tolerance at home- was able to do some yard work and is walking about a mile without increased pain.  Still fatiguing very easily and it sounds like general muscle weakness/poor muscle endurance may be contributing to her pain. Continued work on core and hip mm activation with progressions to strength as tolerated, also worked on lumbar/hip mobility to tolerance as well. Will continue slow/steady progression of all interventions.    EVAL: Patient presents to physical therapy with chronic low back pain that refers into both legs inconsistently.  Patient with history of multiple abdominal/vaginal surgeries and poor tolerance to hip range of motion and overall interventions on this date.  Educated patient in rationale behind breathing mechanics to  help promote core activation during the day.  Tolerated this exercise well and it was added to home exercise program.  Patient presents with limitations in range of motion, strength and posture that are likely contributing to current presentation and would greatly benefit from skilled PT to address physical impairments and improve overall function and quality of life.  OBJECTIVE IMPAIRMENTS: decreased activity tolerance, difficulty walking, decreased ROM, decreased strength, improper body mechanics, postural dysfunction, and pain.   ACTIVITY LIMITATIONS: carrying, lifting, bending, standing, squatting, transfers, bed mobility, and locomotion level  PARTICIPATION LIMITATIONS: cleaning, community activity, yard work, and walking dogs  PERSONAL FACTORS: Age, Fitness, Time since onset of injury/illness/exacerbation, and 1 comorbidity: multiple abdominal surgeries  are also affecting patient's functional outcome.   REHAB POTENTIAL: Good  CLINICAL DECISION MAKING: Stable/uncomplicated  EVALUATION COMPLEXITY: Low   GOALS: Goals reviewed with patient? yes  SHORT TERM GOALS: Target date: 09/26/2023  Patient will be independent in self management strategies to improve quality of life and functional outcomes. Baseline: New Program Goal status: INITIAL  2.   Patient will report at least 50% improvement in overall symptoms and/or function to demonstrate improved functional mobility Baseline: 0% better Goal status: INITIAL  3.  Patient will be able to regularly walk both dogs without having to limit herself due to pain to return to prior level of function Baseline: Unable Goal status: INITIAL  4.  Patient will demonstrate pain-free lower extremity manual muscle testing Baseline: Painful Goal status: INITIAL    LONG TERM GOALS: Target date: 11/07/2023   Patient will report at least 75% improvement in overall symptoms and/or function to demonstrate improved functional mobility Baseline: 0% better Goal status: INITIAL  2.  Patient will improve score on FOTO outcomes measure to projected score to demonstrate overall improved function and QOL Baseline: see above Goal status: INITIAL  3.  Patient will be able to demonstrate breathing mechanics with use of obliques to improve core activation during regular tasks Baseline: Unable Goal status: INITIAL  4.  Patient will be able to demonstrate pain-free and lumbar range of motion in all directions. Baseline: Painful Goal status: INITIAL   PLAN:  PT FREQUENCY: 1-2x/week for a total of 16 visits  PT DURATION: 12 weeks  PLANNED INTERVENTIONS: 97110-Therapeutic exercises, 97530- Therapeutic activity, [id number]- Neuromuscular re-education, 97535- Self Care, 16109- Manual therapy, [phone]- Gait training, [phone]- Orthotic Fit/training, [phone]- Canalith repositioning, [id number]- Aquatic Therapy, 97014- Electrical stimulation (unattended), [phone]- Ionotophoresis 4mg /ml Dexamethasone, Patient/Family education, Balance training, Stair training, Taping, Dry Needling, Joint mobilization, Joint manipulation, Spinal manipulation, Spinal mobilization, Cryotherapy, and Moist heat   PLAN FOR NEXT SESSION: review sit bones-percussion gun, SIJ, breathing exercises  score activation with breath work- progress breathing, add  in UE for lifting and improved lifting 5 year old niece, Lumbar and hip ROM, isometrics. How did she feel after progressions last session?    Nedra Hai, PT, DPT 08/29/23 10:11 AM

## 2023-08-31 ENCOUNTER — Ambulatory Visit: Payer: Medicare Other | Admitting: Physical Therapy

## 2023-08-31 ENCOUNTER — Encounter: Payer: Self-pay | Admitting: Physical Therapy

## 2023-08-31 DIAGNOSIS — M25551 Pain in right hip: Secondary | ICD-10-CM

## 2023-08-31 DIAGNOSIS — M6281 Muscle weakness (generalized): Secondary | ICD-10-CM

## 2023-08-31 DIAGNOSIS — M25552 Pain in left hip: Secondary | ICD-10-CM

## 2023-08-31 DIAGNOSIS — M5459 Other low back pain: Secondary | ICD-10-CM | POA: Diagnosis not present

## 2023-08-31 NOTE — Therapy (Signed)
OUTPATIENT PHYSICAL THERAPY THORACOLUMBAR TREATMENT   Patient Name: Laylanie Kruczek MRN: [id number] DOB:Aug 21, 1951, 72 y.o., female Today's Date: 08/31/2023  END OF SESSION:  PT End of Session - 08/31/23 0931     Visit Number 5    Number of Visits 16    Date for PT Re-Evaluation 11/07/23    Authorization Type [employer] auth required- auth requested    PT Start Time 0933    PT Stop Time 1015    PT Time Calculation (min) 42 min    Activity Tolerance Patient tolerated treatment well;No increased pain    Behavior During Therapy WFL for tasks assessed/performed               Past Medical History:  Diagnosis Date   Allergy    Anemia    Anxiety    Arthritis    Cataracts, bilateral    Gastritis    GERD (gastroesophageal reflux disease)    Heart murmur    Hypertension    Hyperthyroidism    OSA on CPAP    Sleep apnea    Squamous cell carcinoma of skin    Past Surgical History:  Procedure Laterality Date   ABDOMINAL HYSTERECTOMY     ENDOMETRIAL ABLATION     EYE SURGERY     HERNIA REPAIR     as an infant   UPPER GASTROINTESTINAL ENDOSCOPY  11/09/2021   Has had sevearal in [place]   WRIST ARTHROSCOPY Right    Patient Active Problem List   Diagnosis Date Noted   Insomnia 06/07/2023   Acute maxillary sinusitis 08/27/2022   Chronic pain of both feet 11/13/2021   Leg length discrepancy 11/13/2021   Vitamin B12 deficiency 09/02/2021   OSA on CPAP    Obesity hypoventilation syndrome (HCC)    Fall 11/17/2020   Hyperlipemia 11/07/2020   Osteoporosis 09/30/2020   Hyperthyroidism    Hypertension    GERD (gastroesophageal reflux disease)    Gastritis    Cataracts, bilateral     PCP: Philip Aspen, Limmie Patricia, MD  REFERRING PROVIDER: Rodolph Bong, MD  REFERRING DIAG: M54.42,M54.41,G89.29 (ICD-10-CM) - Chronic bilateral low back pain with bilateral sciatica M25.551,M25.552 (ICD-10-CM) - Bilateral hip pain  Rationale for Evaluation and Treatment:  Rehabilitation  THERAPY DIAG:  Bilateral hip pain  Muscle weakness (generalized)  Other low back pain  ONSET DATE: summer 2023  SUBJECTIVE:                                                                                                                                                                                           SUBJECTIVE STATEMENT: 08/31/2023   You were  right I was sore yesterday exercising, yesterday I also unexpectedly spent the day with two 10 year olds, one of which is my niece with CP who still crawls. My back is hurting more than the hips after that. Able to take dogs for a walk yesterday afternoon, not quite doing a mile yet. Water heater went out during the night, when I light it I have to sit on the floor with either my legs to one side or crossed and it is difficult to get up.   Eval: Pain keeps changing. 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 Pain came on suddenly last year. States she also has stenosis and a cyst in her low back. State she has never been strong in her core. She feels strength will help with her pain. States she sometimes has arthritis pain and sometimes she has nerve pain. Previous pain was mostly in her right leg. States that now it is also in her left hip. Reports she recently received a left hip injection and that has helped. States she doesn't exercise much but she does walk but it hurts when she walks. She knows the move she holds herself up the less pain she has. Going up slight grades/inclines is challenging and painful.  States she has 2 dogs and they have to be walked separate and she needs to make sure they get their daily walks. Walk totals about an hour.  Some days she can't walk because she is either so exhausted or in too much pain.   Nerve pain can go around the leg into the groin on right but not recently.  Now leg pain is mostly in the lower left leg and not in the groin.    Reports that gardening is too difficult for her to do. Can't do  anything where she has to pull or lift (bags of compost).   Has a niece that's 72 years old with CP and not walking - currently crawling - needs to be able to lift/assist her into chair - 60#    PERTINENT HISTORY:  HTN, anxiety, abdominal hysterectomy (hx of 5-6 abdominal/vaginal surgeries for endometriosis), hx right ankle fracture  PAIN:  Are you having pain? Yes: NPRS scale: 6/10 Pain location: R hip  Pain description: OA pain and nerve pain combination   Aggravating factors: unsure- maybe anything with hips flexed, sitting or sleeping position Relieving factors: walking, less sitting, activity, maybe HEP   PRECAUTIONS: None  RED FLAGS: None   WEIGHT BEARING RESTRICTIONS: No  FALLS:  Has patient fallen in last 6 months? No  LIVING ENVIRONMENT: Lives with: lifelong friend Lives in: House/apartment Stairs: No Has following equipment at home: Grab bars  OCCUPATION: retired, likes to walk, has 2 dogs that she likes to walk  PLOF: Independent, loves to garden   PATIENT GOALS: to be able to walk her dogs regularly and have stronger core    OBJECTIVE:  Note: Objective measures were completed at Evaluation unless otherwise noted.  DIAGNOSTIC FINDINGS:  08/10/23 hip xray - awaiting results  PATIENT SURVEYS:  FOTO 26%  SCREENING FOR RED FLAGS: Bowel or bladder incontinence: No Spinal tumors: No Cauda equina syndrome: No Compression fracture: No Abdominal aneurysm: No  COGNITION: Overall cognitive status: Within functional limits for tasks assessed     SENSATION: Not tested    POSTURE: rounded shoulders, forward head, increased thoracic kyphosis, posterior pelvic tilt, and flexed trunk   PALPATION: No tender spots observed on this date  LUMBAR ROM:   AROM eval  Flexion 75% limited*  Extension 90% limited*  Right lateral flexion 50% limited*  Left lateral flexion 75% limited* - favors flexion  Right rotation   Left rotation    (Blank rows = not  tested)    LE Measurements Lower Extremity Right EVAL Left EVAL   A/PROM MMT A/PROM MMT  Hip Flexion 95* 3 95* 3  Hip Extension      Hip Abduction      Hip Adduction      Hip Internal rotation (in prone) 60*  60*   Hip External rotation (in prone) 30*  10*   Knee Flexion  3+*  3+*  Knee Extension  3+*  3+*  Ankle Dorsiflexion  3+*  3+*  Ankle Plantarflexion      Ankle Inversion      Ankle Eversion       (Blank rows = not tested) * pain   LUMBAR SPECIAL TESTS:  Straight leg raise test: + B and Slump test: + B  Breathing Assessment- belly breather- minimal diaphragmatica excursion noted with longer breathes  FUNCTIONAL TESTS:  Holds breath with all transitional movements    TODAY'S TREATMENT:                                                                                                                              DATE:   08/31/2023   Therapeutic Exercise:   Supine: hooklying TA sets 20x3 second holds, SKTC 6x3 seconds B, lumbar rotation stretch 6x3 seconds B , supine clams red TB x15 , bridges + ABD  x10 red TB Prone:    Seated: Seated HS stretches 2x30 seconds B   Standing: standing hip ABD red TB above knees x10 B, standing hip extension red TB above knees x10 B, hip hikes x10 B Neuromuscular Re-education:   Manual Therapy:  Therapeutic Activity:     Self Care: Trigger Point Dry Needling:  Modalities: MHP to lx spine x10 minutes during some supine exercises                 PATIENT EDUCATION:  Education details: added red TB to HEP, possible DOMS after progressions today  Person educated: Patient Education method: Explanation, Demonstration, and Handouts Education comprehension: verbalized understanding   HOME EXERCISE PROGRAM: Long exhale breathing, [email or web address]  Access Code: [email or web address] URL: [email or web address] Date: 08/31/2023 Prepared by: Nedra Hai  Exercises - Supine Posterior Pelvic Tilt  - 5 hold - Lying Prone with 2  Pillows  - Supine Hip Adduction Isometric with Ball  - 2 sets - 10 reps - 5 hold - Hooklying Clamshell with Resistance  - 2 sets - 10 reps - 5 hold - Supine Bridge with Resistance Band  - 1 x daily - 7 x weekly - 2 sets - 10 reps - 1 second  hold - Supine Lower Trunk Rotation  - 1 x daily - 7 x weekly - 2 sets - 10 reps  ASSESSMENT:  CLINICAL IMPRESSION: 08/31/2023  Pt arrives today doing well, sounds like last session was helpful and her activity tolerance improved since last time, able tolerate longer walks with her dogs. Since last interventions were so successful, performed similar session today with progressions as appropriate and expansion of HEP PRN. Will continue to increase challenges as able/tolerated.     EVAL: Patient presents to physical therapy with chronic low back pain that refers into both legs inconsistently.  Patient with history of multiple abdominal/vaginal surgeries and poor tolerance to hip range of motion and overall interventions on this date.  Educated patient in rationale behind breathing mechanics to help promote core activation during the day.  Tolerated this exercise well and it was added to home exercise program.  Patient presents with limitations in range of motion, strength and posture that are likely contributing to current presentation and would greatly benefit from skilled PT to address physical impairments and improve overall function and quality of life.  OBJECTIVE IMPAIRMENTS: decreased activity tolerance, difficulty walking, decreased ROM, decreased strength, improper body mechanics, postural dysfunction, and pain.   ACTIVITY LIMITATIONS: carrying, lifting, bending, standing, squatting, transfers, bed mobility, and locomotion level  PARTICIPATION LIMITATIONS: cleaning, community activity, yard work, and walking dogs  PERSONAL FACTORS: Age, Fitness, Time since onset of injury/illness/exacerbation, and 1 comorbidity: multiple abdominal surgeries  are also  affecting patient's functional outcome.   REHAB POTENTIAL: Good  CLINICAL DECISION MAKING: Stable/uncomplicated  EVALUATION COMPLEXITY: Low   GOALS: Goals reviewed with patient? yes  SHORT TERM GOALS: Target date: 09/26/2023  Patient will be independent in self management strategies to improve quality of life and functional outcomes. Baseline: New Program Goal status: INITIAL  2.  Patient will report at least 50% improvement in overall symptoms and/or function to demonstrate improved functional mobility Baseline: 0% better Goal status: INITIAL  3.  Patient will be able to regularly walk both dogs without having to limit herself due to pain to return to prior level of function Baseline: Unable Goal status: INITIAL  4.  Patient will demonstrate pain-free lower extremity manual muscle testing Baseline: Painful Goal status: INITIAL    LONG TERM GOALS: Target date: 11/07/2023   Patient will report at least 75% improvement in overall symptoms and/or function to demonstrate improved functional mobility Baseline: 0% better Goal status: INITIAL  2.  Patient will improve score on FOTO outcomes measure to projected score to demonstrate overall improved function and QOL Baseline: see above Goal status: INITIAL  3.  Patient will be able to demonstrate breathing mechanics with use of obliques to improve core activation during regular tasks Baseline: Unable Goal status: INITIAL  4.  Patient will be able to demonstrate pain-free and lumbar range of motion in all directions. Baseline: Painful Goal status: INITIAL   PLAN:  PT FREQUENCY: 1-2x/week for a total of 16 visits  PT DURATION: 12 weeks  PLANNED INTERVENTIONS: 97110-Therapeutic exercises, 97530- Therapeutic activity, [id number]- Neuromuscular re-education, 97535- Self Care, 78469- Manual therapy, [phone]- Gait training, [phone]- Orthotic Fit/training, [phone]- Canalith repositioning, [id number]- Aquatic Therapy, 97014- Electrical  stimulation (unattended), [phone]- Ionotophoresis 4mg /ml Dexamethasone, Patient/Family education, Balance training, Stair training, Taping, Dry Needling, Joint mobilization, Joint manipulation, Spinal manipulation, Spinal mobilization, Cryotherapy, and Moist heat   PLAN FOR NEXT SESSION: review sit bones-percussion gun, SIJ, breathing exercises  score activation with breath work- progress breathing, add in UE for lifting and improved lifting 65 year old niece. Continue progressing hip strength    Nedra Hai, PT, DPT 08/31/23 10:17 AM

## 2023-09-03 NOTE — Progress Notes (Signed)
HPI F never smoker followed for OSA, complicated by HTN,Hypercholesterolemia, Hyperthyroid,  HST 04/29/21- AHI/ 17.8/ hr, desaturation to 88%, body weight 163 lbs  ========================================================================   08/09/23-  72 yoF never smoker followed for OSA, complicated by HTN,Hypercholesterolemia, Hyperthyroid,  CPAP auto 5-15/ [employer] Download-compliance 77%, AHI 0.5/hr  Body weight today-150 lbs      note weight loss -Sonata 5 mg,  Download reviewed. Falls asleep quickly, but still wakes and hard to get back to sleep. Sonata at 03:00AM last too long. Taking celebrex for hip pain. Thyroid being managed. Flu vax senior today  11/12-24- 72 yoF never smoker followed for OSA, Insomnia, complicated by HTN,Hypercholesterolemia, Hyperthyroid,  CPAP auto 5-15/ [employer] Download-compliance 90%, AHI 0.8/hr  Body weight today-150 lbs      note weight loss -Sonata 5 mg,  PCP treated acute sinusitis with cefuroxime x 10 days/ 08/24/23 Download reviewed- CPAP ok. Sonata helps at night and when using it, she also tends to nap in afternoon. This is new- maybe because she is more relaxed. Doesn't seem like drug carryover for this short half-life med.   ROS-see HPI   + = positive Constitutional:    weight loss, night sweats, fevers, chills, fatigue, lassitude. HEENT:    headaches, difficulty swallowing, tooth/dental problems, sore throat,       sneezing, itching, ear ache, +nasal congestion, post nasal drip, snoring CV:    chest pain, orthopnea, PND, swelling in lower extremities, anasarca,                                   dizziness, palpitations Resp:   shortness of breath with exertion or at rest.                productive cough,   non-productive cough, coughing up of blood.              change in color of mucus.  wheezing.   Skin:    rash or lesions. GI:  No-   heartburn, indigestion, abdominal pain, nausea, vomiting, diarrhea,                 change in bowel habits,  loss of appetite GU: dysuria, change in color of urine, no urgency or frequency.   flank pain. MS:   joint pain, stiffness, decreased range of motion, back pain. Neuro-     nothing unusual Psych:  change in mood or affect.  depression or anxiety.   memory loss.  OBJ- Physical Exam General- Alert, Oriented, Affect-appropriate, Distress- none acute,  Skin- rash-none, lesions- none, excoriation- none Lymphadenopathy- none Head- atraumatic            Eyes- +strabismus            Ears- Hearing, canals-normal            Nose- Clear, no-Septal dev, mucus, polyps, erosion, perforation             Throat- Mallampati II , mucosa clear , drainage- none, tonsils- atrophic, + teeth Neck- flexible , trachea midline, no stridor , thyroid nl, carotid no bruit Chest - symmetrical excursion , unlabored           Heart/CV- RRR , no murmur , no gallop  , no rub, nl s1 s2                           - JVD-  none , edema- none, stasis changes- none, varices- none           Lung- clear to P&A, wheeze- none, cough- none , dullness-none, rub- none           Chest wall-  Abd-  Br/ Gen/ Rectal- Not done, not indicated Extrem- cyanosis- none, clubbing, none, atrophy- none, strength- nl Neuro- grossly intact to observation

## 2023-09-05 ENCOUNTER — Ambulatory Visit: Payer: Medicare Other | Admitting: Physical Therapy

## 2023-09-05 ENCOUNTER — Encounter: Payer: Self-pay | Admitting: Physical Therapy

## 2023-09-05 DIAGNOSIS — M25552 Pain in left hip: Secondary | ICD-10-CM | POA: Diagnosis not present

## 2023-09-05 DIAGNOSIS — G8929 Other chronic pain: Secondary | ICD-10-CM | POA: Diagnosis not present

## 2023-09-05 DIAGNOSIS — M545 Low back pain, unspecified: Secondary | ICD-10-CM | POA: Diagnosis not present

## 2023-09-05 DIAGNOSIS — M6281 Muscle weakness (generalized): Secondary | ICD-10-CM | POA: Diagnosis not present

## 2023-09-05 DIAGNOSIS — M5459 Other low back pain: Secondary | ICD-10-CM | POA: Diagnosis not present

## 2023-09-05 DIAGNOSIS — M25551 Pain in right hip: Secondary | ICD-10-CM

## 2023-09-05 NOTE — Therapy (Signed)
OUTPATIENT PHYSICAL THERAPY THORACOLUMBAR TREATMENT   Patient Name: Bethany Cole MRN: [id number] DOB:12-30-1950, 72 y.o., female Today's Date: 09/05/2023  END OF SESSION:  PT End of Session - 09/05/23 0934     Visit Number 6    Number of Visits 16    Date for PT Re-Evaluation 11/07/23    Authorization Type [employer] auth required- auth approved 16 visits 08/15/2023 - 11/07/2023    Authorization - Visit Number 6    Authorization - Number of Visits 16    Progress Note Due on Visit 10    PT Start Time 0935   late to check in   PT Stop Time 1013    PT Time Calculation (min) 38 min    Activity Tolerance Patient tolerated treatment well;No increased pain    Behavior During Therapy WFL for tasks assessed/performed               Past Medical History:  Diagnosis Date   Allergy    Anemia    Anxiety    Arthritis    Cataracts, bilateral    Gastritis    GERD (gastroesophageal reflux disease)    Heart murmur    Hypertension    Hyperthyroidism    OSA on CPAP    Sleep apnea    Squamous cell carcinoma of skin    Past Surgical History:  Procedure Laterality Date   ABDOMINAL HYSTERECTOMY     ENDOMETRIAL ABLATION     EYE SURGERY     HERNIA REPAIR     as an infant   UPPER GASTROINTESTINAL ENDOSCOPY  11/09/2021   Has had sevearal in [place]   WRIST ARTHROSCOPY Right    Patient Active Problem List   Diagnosis Date Noted   Insomnia 06/07/2023   Acute maxillary sinusitis 08/27/2022   Chronic pain of both feet 11/13/2021   Leg length discrepancy 11/13/2021   Vitamin B12 deficiency 09/02/2021   OSA on CPAP    Obesity hypoventilation syndrome (HCC)    Fall 11/17/2020   Hyperlipemia 11/07/2020   Osteoporosis 09/30/2020   Hyperthyroidism    Hypertension    GERD (gastroesophageal reflux disease)    Gastritis    Cataracts, bilateral     PCP: Philip Aspen, Limmie Patricia, MD  REFERRING PROVIDER: Rodolph Bong, MD  REFERRING DIAG: M54.42,M54.41,G89.29 (ICD-10-CM) - Chronic  bilateral low back pain with bilateral sciatica M25.551,M25.552 (ICD-10-CM) - Bilateral hip pain  Rationale for Evaluation and Treatment: Rehabilitation  THERAPY DIAG:  Bilateral hip pain  Muscle weakness (generalized)  Other low back pain  Chronic midline low back pain without sciatica  ONSET DATE: summer 2023  SUBJECTIVE:  SUBJECTIVE STATEMENT: 09/05/2023  States she took a long walk with her dog yesterday and today is sore in the hips and back (mostly back and right hip).    Eval: Pain keeps changing. 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 Pain came on suddenly last year. States she also has stenosis and a cyst in her low back. State she has never been strong in her core. She feels strength will help with her pain. States she sometimes has arthritis pain and sometimes she has nerve pain. Previous pain was mostly in her right leg. States that now it is also in her left hip. Reports she recently received a left hip injection and that has helped. States she doesn't exercise much but she does walk but it hurts when she walks. She knows the move she holds herself up the less pain she has. Going up slight grades/inclines is challenging and painful.  States she has 2 dogs and they have to be walked separate and she needs to make sure they get their daily walks. Walk totals about an hour.  Some days she can't walk because she is either so exhausted or in too much pain.   Nerve pain can go around the leg into the groin on right but not recently.  Now leg pain is mostly in the lower left leg and not in the groin.    Reports that gardening is too difficult for her to do. Can't do anything where she has to pull or lift (bags of compost).   Has a niece that's 11 years old with CP and not walking - currently crawling  - needs to be able to lift/assist her into chair - 60#    PERTINENT HISTORY:  HTN, anxiety, abdominal hysterectomy (hx of 5-6 abdominal/vaginal surgeries for endometriosis), hx right ankle fracture  PAIN:  Are you having pain? Yes: NPRS scale: 5/10 Pain location: R hip and low back   Pain description: sore   Aggravating factors: unsure- maybe anything with hips flexed, sitting or sleeping position Relieving factors: walking, less sitting, activity, maybe HEP   PRECAUTIONS: None  RED FLAGS: None   WEIGHT BEARING RESTRICTIONS: No  FALLS:  Has patient fallen in last 6 months? No  LIVING ENVIRONMENT: Lives with: lifelong friend Lives in: House/apartment Stairs: No Has following equipment at home: Grab bars  OCCUPATION: retired, likes to walk, has 2 dogs that she likes to walk  PLOF: Independent, loves to garden   PATIENT GOALS: to be able to walk her dogs regularly and have stronger core    OBJECTIVE:  Note: Objective measures were completed at Evaluation unless otherwise noted.  DIAGNOSTIC FINDINGS:  08/10/23 hip xray - awaiting results  PATIENT SURVEYS:  FOTO 26%  SCREENING FOR RED FLAGS: Bowel or bladder incontinence: No Spinal tumors: No Cauda equina syndrome: No Compression fracture: No Abdominal aneurysm: No  COGNITION: Overall cognitive status: Within functional limits for tasks assessed     SENSATION: Not tested    POSTURE: rounded shoulders, forward head, increased thoracic kyphosis, posterior pelvic tilt, and flexed trunk   PALPATION: No tender spots observed on this date  LUMBAR ROM:   AROM eval  Flexion 75% limited*  Extension 90% limited*  Right lateral flexion 50% limited*  Left lateral flexion 75% limited* - favors flexion  Right rotation   Left rotation    (Blank rows = not tested)    LE Measurements Lower Extremity Right EVAL Left EVAL   A/PROM MMT A/PROM MMT  Hip Flexion 95* 3  95* 3  Hip Extension      Hip Abduction       Hip Adduction      Hip Internal rotation (in prone) 60*  60*   Hip External rotation (in prone) 30*  10*   Knee Flexion  3+*  3+*  Knee Extension  3+*  3+*  Ankle Dorsiflexion  3+*  3+*  Ankle Plantarflexion      Ankle Inversion      Ankle Eversion       (Blank rows = not tested) * pain   LUMBAR SPECIAL TESTS:  Straight leg raise test: + B and Slump test: + B  Breathing Assessment- belly breather- minimal diaphragmatica excursion noted with longer breathes  FUNCTIONAL TESTS:  Holds breath with all transitional movements    TODAY'S TREATMENT:                                                                                                                              DATE:   09/05/2023   Therapeutic Exercise:   Supine: SKC x10 10" hlds B, LTR 2 minutes, 4 minutes with 90/90 and breathing hip add iso 2 minutes, hip abd belt 2 minutes 5" holds   Review of HEP  Prone:    Seated: hip add iso 5" holds 1 minutes  Standing:   Neuromuscular Re-education:   Manual Therapy:  IASTM with percussion gun to right hip in s/l - tolerated well  Therapeutic Activity:     Self Care: Trigger Point Dry Needling:  Modalities: MHP to lx spine x10 minutes during some supine exercises    PATIENT EDUCATION:  Education details: on HEP, on how to create a hot pack at home with ice- printed off instructions. Person educated: Patient Education method: Explanation, Demonstration, and Handouts Education comprehension: verbalized understanding   HOME EXERCISE PROGRAM: Long exhale breathing, [id number]  ASSESSMENT:  CLINICAL IMPRESSION: 09/05/2023 Started with heat and 90/90 feel good exercises secondary to increase in hip and back pain on this date. Tolerated percussion gun reporting 4/10 in hip and back  end of session and encouraged patient to continue to use heat prior to walks.    EVAL: Patient presents to physical therapy with chronic low back pain that refers into both legs  inconsistently.  Patient with history of multiple abdominal/vaginal surgeries and poor tolerance to hip range of motion and overall interventions on this date.  Educated patient in rationale behind breathing mechanics to help promote core activation during the day.  Tolerated this exercise well and it was added to home exercise program.  Patient presents with limitations in range of motion, strength and posture that are likely contributing to current presentation and would greatly benefit from skilled PT to address physical impairments and improve overall function and quality of life.  OBJECTIVE IMPAIRMENTS: decreased activity tolerance, difficulty walking, decreased ROM, decreased strength, improper body mechanics, postural dysfunction, and pain.   ACTIVITY LIMITATIONS: carrying, lifting, bending, standing, squatting,  transfers, bed mobility, and locomotion level  PARTICIPATION LIMITATIONS: cleaning, community activity, yard work, and walking dogs  PERSONAL FACTORS: Age, Fitness, Time since onset of injury/illness/exacerbation, and 1 comorbidity: multiple abdominal surgeries  are also affecting patient's functional outcome.   REHAB POTENTIAL: Good  CLINICAL DECISION MAKING: Stable/uncomplicated  EVALUATION COMPLEXITY: Low   GOALS: Goals reviewed with patient? yes  SHORT TERM GOALS: Target date: 09/26/2023  Patient will be independent in self management strategies to improve quality of life and functional outcomes. Baseline: New Program Goal status: INITIAL  2.  Patient will report at least 50% improvement in overall symptoms and/or function to demonstrate improved functional mobility Baseline: 0% better Goal status: INITIAL  3.  Patient will be able to regularly walk both dogs without having to limit herself due to pain to return to prior level of function Baseline: Unable Goal status: INITIAL  4.  Patient will demonstrate pain-free lower extremity manual muscle testing Baseline:  Painful Goal status: INITIAL    LONG TERM GOALS: Target date: 11/07/2023   Patient will report at least 75% improvement in overall symptoms and/or function to demonstrate improved functional mobility Baseline: 0% better Goal status: INITIAL  2.  Patient will improve score on FOTO outcomes measure to projected score to demonstrate overall improved function and QOL Baseline: see above Goal status: INITIAL  3.  Patient will be able to demonstrate breathing mechanics with use of obliques to improve core activation during regular tasks Baseline: Unable Goal status: INITIAL  4.  Patient will be able to demonstrate pain-free and lumbar range of motion in all directions. Baseline: Painful Goal status: INITIAL   PLAN:  PT FREQUENCY: 1-2x/week for a total of 16 visits  PT DURATION: 12 weeks  PLANNED INTERVENTIONS: 97110-Therapeutic exercises, 97530- Therapeutic activity, [id number]- Neuromuscular re-education, 97535- Self Care, [id number]- Manual therapy, [phone]- Gait training, [phone]- Orthotic Fit/training, [phone]- Canalith repositioning, [id number]- Aquatic Therapy, 97014- Electrical stimulation (unattended), [phone]- Ionotophoresis 4mg /ml Dexamethasone, Patient/Family education, Balance training, Stair training, Taping, Dry Needling, Joint mobilization, Joint manipulation, Spinal manipulation, Spinal mobilization, Cryotherapy, and Moist heat   PLAN FOR NEXT SESSION: review sit bones-percussion gun, SIJ, breathing exercises  score activation with breath work- progress breathing, add in UE for lifting and improved lifting 11 year old niece. Continue progressing hip strength    10:17 AM, 09/05/23 Tereasa Coop, DPT Physical Therapy with [facility]

## 2023-09-06 ENCOUNTER — Ambulatory Visit: Payer: Medicare Other | Admitting: Internal Medicine

## 2023-09-06 VITALS — BP 140/81 | HR 72 | Ht 63.5 in | Wt 156.3 lb

## 2023-09-06 DIAGNOSIS — F5101 Primary insomnia: Secondary | ICD-10-CM | POA: Diagnosis not present

## 2023-09-06 DIAGNOSIS — G4733 Obstructive sleep apnea (adult) (pediatric): Secondary | ICD-10-CM | POA: Diagnosis not present

## 2023-09-06 NOTE — Patient Instructions (Signed)
We suggested trying 2 x 5 mg Sonata for sleep, using your existing supply for a few days. We can decide from that experience whether to change your prescribed dose, or change the medicine.

## 2023-09-07 ENCOUNTER — Encounter: Payer: Self-pay | Admitting: Physical Therapy

## 2023-09-07 ENCOUNTER — Ambulatory Visit: Payer: Medicare Other | Admitting: Physical Therapy

## 2023-09-07 DIAGNOSIS — M6281 Muscle weakness (generalized): Secondary | ICD-10-CM

## 2023-09-07 DIAGNOSIS — M25552 Pain in left hip: Secondary | ICD-10-CM | POA: Diagnosis not present

## 2023-09-07 DIAGNOSIS — M545 Low back pain, unspecified: Secondary | ICD-10-CM

## 2023-09-07 DIAGNOSIS — G8929 Other chronic pain: Secondary | ICD-10-CM | POA: Diagnosis not present

## 2023-09-07 DIAGNOSIS — M5459 Other low back pain: Secondary | ICD-10-CM

## 2023-09-07 DIAGNOSIS — M25551 Pain in right hip: Secondary | ICD-10-CM

## 2023-09-07 NOTE — Therapy (Signed)
OUTPATIENT PHYSICAL THERAPY THORACOLUMBAR TREATMENT   Patient Name: Bethany Cole MRN: [id number] DOB:1951-02-14, 72 y.o., female Today's Date: 09/07/2023  END OF SESSION:  PT End of Session - 09/07/23 0934     Visit Number 7    Number of Visits 16    Date for PT Re-Evaluation 11/07/23    Authorization Type [employer] auth required- auth approved 16 visits 08/15/2023 - 11/07/2023    Authorization - Visit Number 7    Authorization - Number of Visits 16    Progress Note Due on Visit 10    PT Start Time 0935    PT Stop Time 1013    PT Time Calculation (min) 38 min    Activity Tolerance Patient tolerated treatment well;No increased pain    Behavior During Therapy WFL for tasks assessed/performed               Past Medical History:  Diagnosis Date   Allergy    Anemia    Anxiety    Arthritis    Cataracts, bilateral    Gastritis    GERD (gastroesophageal reflux disease)    Heart murmur    Hypertension    Hyperthyroidism    OSA on CPAP    Sleep apnea    Squamous cell carcinoma of skin    Past Surgical History:  Procedure Laterality Date   ABDOMINAL HYSTERECTOMY     ENDOMETRIAL ABLATION     EYE SURGERY     HERNIA REPAIR     as an infant   UPPER GASTROINTESTINAL ENDOSCOPY  11/09/2021   Has had sevearal in [place]   WRIST ARTHROSCOPY Right    Patient Active Problem List   Diagnosis Date Noted   Insomnia 06/07/2023   Acute maxillary sinusitis 08/27/2022   Chronic pain of both feet 11/13/2021   Leg length discrepancy 11/13/2021   Vitamin B12 deficiency 09/02/2021   OSA on CPAP    Obesity hypoventilation syndrome (HCC)    Fall 11/17/2020   Hyperlipemia 11/07/2020   Osteoporosis 09/30/2020   Hyperthyroidism    Hypertension    GERD (gastroesophageal reflux disease)    Gastritis    Cataracts, bilateral     PCP: Philip Aspen, Limmie Patricia, MD  REFERRING PROVIDER: Rodolph Bong, MD  REFERRING DIAG: M54.42,M54.41,G89.29 (ICD-10-CM) - Chronic bilateral low back  pain with bilateral sciatica M25.551,M25.552 (ICD-10-CM) - Bilateral hip pain  Rationale for Evaluation and Treatment: Rehabilitation  THERAPY DIAG:  Bilateral hip pain  Muscle weakness (generalized)  Other low back pain  Chronic midline low back pain without sciatica  ONSET DATE: summer 2023  SUBJECTIVE:  SUBJECTIVE STATEMENT: 09/07/2023  States she walked her dog a couple times since last session. States she is sore today in her hips and she hasn't walked much today. States during her walks she felt like her back was looser.   Eval: Pain keeps changing. 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 Pain came on suddenly last year. States she also has stenosis and a cyst in her low back. State she has never been strong in her core. She feels strength will help with her pain. States she sometimes has arthritis pain and sometimes she has nerve pain. Previous pain was mostly in her right leg. States that now it is also in her left hip. Reports she recently received a left hip injection and that has helped. States she doesn't exercise much but she does walk but it hurts when she walks. She knows the move she holds herself up the less pain she has. Going up slight grades/inclines is challenging and painful.  States she has 2 dogs and they have to be walked separate and she needs to make sure they get their daily walks. Walk totals about an hour.  Some days she can't walk because she is either so exhausted or in too much pain.   Nerve pain can go around the leg into the groin on right but not recently.  Now leg pain is mostly in the lower left leg and not in the groin.    Reports that gardening is too difficult for her to do. Can't do anything where she has to pull or lift (bags of compost).   Has a niece that's 44  years old with CP and not walking - currently crawling - needs to be able to lift/assist her into chair - 60#    PERTINENT HISTORY:  HTN, anxiety, abdominal hysterectomy (hx of 5-6 abdominal/vaginal surgeries for endometriosis), hx right ankle fracture  PAIN:  Are you having pain? Yes: NPRS scale: 4/10 Pain location: R hip   Pain description: sore   Aggravating factors: unsure- maybe anything with hips flexed, sitting or sleeping position Relieving factors: walking, less sitting, activity, maybe HEP   PRECAUTIONS: None  RED FLAGS: None   WEIGHT BEARING RESTRICTIONS: No  FALLS:  Has patient fallen in last 6 months? No  LIVING ENVIRONMENT: Lives with: lifelong friend Lives in: House/apartment Stairs: No Has following equipment at home: Grab bars  OCCUPATION: retired, likes to walk, has 2 dogs that she likes to walk  PLOF: Independent, loves to garden   PATIENT GOALS: to be able to walk her dogs regularly and have stronger core    OBJECTIVE:  Note: Objective measures were completed at Evaluation unless otherwise noted.  DIAGNOSTIC FINDINGS:  08/10/23 hip xray - awaiting results  PATIENT SURVEYS:  FOTO 26%  SCREENING FOR RED FLAGS: Bowel or bladder incontinence: No Spinal tumors: No Cauda equina syndrome: No Compression fracture: No Abdominal aneurysm: No  COGNITION: Overall cognitive status: Within functional limits for tasks assessed     SENSATION: Not tested    POSTURE: rounded shoulders, forward head, increased thoracic kyphosis, posterior pelvic tilt, and flexed trunk   PALPATION: No tender spots observed on this date  LUMBAR ROM:   AROM eval  Flexion 75% limited*  Extension 90% limited*  Right lateral flexion 50% limited*  Left lateral flexion 75% limited* - favors flexion  Right rotation   Left rotation    (Blank rows = not tested)    LE Measurements Lower Extremity Right EVAL Left EVAL   A/PROM  MMT A/PROM MMT  Hip Flexion 95* 3  95* 3  Hip Extension      Hip Abduction      Hip Adduction      Hip Internal rotation (in prone) 60*  60*   Hip External rotation (in prone) 30*  10*   Knee Flexion  3+*  3+*  Knee Extension  3+*  3+*  Ankle Dorsiflexion  3+*  3+*  Ankle Plantarflexion      Ankle Inversion      Ankle Eversion       (Blank rows = not tested) * pain   LUMBAR SPECIAL TESTS:  Straight leg raise test: + B and Slump test: + B  Breathing Assessment- belly breather- minimal diaphragmatica excursion noted with longer breathes  FUNCTIONAL TESTS:  Holds breath with all transitional movements    TODAY'S TREATMENT:                                                                                                                              DATE:   09/07/2023   Therapeutic Exercise:   Supine: 90/90 relief with breathing 2 minutes, LTR 2 minutes, SKC 3x5 10" holds B, bridges 2x10 with post tilt and exhale breathing, TRA marching x5 30" rounds  Review of HEP  Prone:    Seated:    Standing:   Neuromuscular Re-education:   Manual Therapy:  Therapeutic Activity:     Self Care: Trigger Point Dry Needling:  Modalities: MHP to lx spine x10 minutes during   supine exercises    PATIENT EDUCATION:  Education details: on HEP  Person educated: Patient Education method: Programmer, multimedia, Facilities manager, and Handouts Education comprehension: verbalized understanding   HOME EXERCISE PROGRAM: Long exhale breathing, [id number]  ASSESSMENT:  CLINICAL IMPRESSION: 09/07/2023 Educated patient on difference between strengthening and feel good exercises. Overall tolerated well but fatigued quickly. Initially difficult time with core activation with marching but this improved with repetition. Added new exercises to HEP. Overall patient doing well and would continue to benefit from skilled PT at this time.    EVAL: Patient presents to physical therapy with chronic low back pain that refers into both legs inconsistently.   Patient with history of multiple abdominal/vaginal surgeries and poor tolerance to hip range of motion and overall interventions on this date.  Educated patient in rationale behind breathing mechanics to help promote core activation during the day.  Tolerated this exercise well and it was added to home exercise program.  Patient presents with limitations in range of motion, strength and posture that are likely contributing to current presentation and would greatly benefit from skilled PT to address physical impairments and improve overall function and quality of life.  OBJECTIVE IMPAIRMENTS: decreased activity tolerance, difficulty walking, decreased ROM, decreased strength, improper body mechanics, postural dysfunction, and pain.   ACTIVITY LIMITATIONS: carrying, lifting, bending, standing, squatting, transfers, bed mobility, and locomotion level  PARTICIPATION LIMITATIONS: cleaning, community activity, yard work, and walking  dogs  PERSONAL FACTORS: Age, Fitness, Time since onset of injury/illness/exacerbation, and 1 comorbidity: multiple abdominal surgeries  are also affecting patient's functional outcome.   REHAB POTENTIAL: Good  CLINICAL DECISION MAKING: Stable/uncomplicated  EVALUATION COMPLEXITY: Low   GOALS: Goals reviewed with patient? yes  SHORT TERM GOALS: Target date: 09/26/2023  Patient will be independent in self management strategies to improve quality of life and functional outcomes. Baseline: New Program Goal status: INITIAL  2.  Patient will report at least 50% improvement in overall symptoms and/or function to demonstrate improved functional mobility Baseline: 0% better Goal status: INITIAL  3.  Patient will be able to regularly walk both dogs without having to limit herself due to pain to return to prior level of function Baseline: Unable Goal status: INITIAL  4.  Patient will demonstrate pain-free lower extremity manual muscle testing Baseline: Painful Goal  status: INITIAL    LONG TERM GOALS: Target date: 11/07/2023   Patient will report at least 75% improvement in overall symptoms and/or function to demonstrate improved functional mobility Baseline: 0% better Goal status: INITIAL  2.  Patient will improve score on FOTO outcomes measure to projected score to demonstrate overall improved function and QOL Baseline: see above Goal status: INITIAL  3.  Patient will be able to demonstrate breathing mechanics with use of obliques to improve core activation during regular tasks Baseline: Unable Goal status: INITIAL  4.  Patient will be able to demonstrate pain-free and lumbar range of motion in all directions. Baseline: Painful Goal status: INITIAL   PLAN:  PT FREQUENCY: 1-2x/week for a total of 16 visits  PT DURATION: 12 weeks  PLANNED INTERVENTIONS: 97110-Therapeutic exercises, 97530- Therapeutic activity, [id number]- Neuromuscular re-education, 97535- Self Care, 53664- Manual therapy, [phone]- Gait training, [phone]- Orthotic Fit/training, [phone]- Canalith repositioning, [id number]- Aquatic Therapy, 97014- Electrical stimulation (unattended), [phone]- Ionotophoresis 4mg /ml Dexamethasone, Patient/Family education, Balance training, Stair training, Taping, Dry Needling, Joint mobilization, Joint manipulation, Spinal manipulation, Spinal mobilization, Cryotherapy, and Moist heat   PLAN FOR NEXT SESSION:wants to be have clear idea of her feel good exercises and her maintainence  exercises.  review sit bones-percussion gun, SIJ, breathing exercises  score activation with breath work- progress breathing, add in UE for lifting and improved lifting 73 year old niece. Continue progressing hip strength    10:18 AM, 09/07/23 Tereasa Coop, DPT Physical Therapy with [facility]

## 2023-09-12 ENCOUNTER — Encounter: Payer: Self-pay | Admitting: Internal Medicine

## 2023-09-12 ENCOUNTER — Ambulatory Visit: Payer: Medicare Other

## 2023-09-12 ENCOUNTER — Ambulatory Visit (INDEPENDENT_AMBULATORY_CARE_PROVIDER_SITE_OTHER): Payer: Medicare Other | Admitting: Internal Medicine

## 2023-09-12 VITALS — BP 120/84 | HR 65 | Temp 97.4°F | Wt 154.4 lb

## 2023-09-12 DIAGNOSIS — I10 Essential (primary) hypertension: Secondary | ICD-10-CM | POA: Diagnosis not present

## 2023-09-12 DIAGNOSIS — F5101 Primary insomnia: Secondary | ICD-10-CM

## 2023-09-12 DIAGNOSIS — E782 Mixed hyperlipidemia: Secondary | ICD-10-CM

## 2023-09-12 DIAGNOSIS — E538 Deficiency of other specified B group vitamins: Secondary | ICD-10-CM | POA: Diagnosis not present

## 2023-09-12 LAB — LIPID PANEL
Cholesterol: 215 mg/dL — ABNORMAL HIGH (ref 0–200)
HDL: 82.8 mg/dL (ref >39.00)
LDL Cholesterol: 120 mg/dL — ABNORMAL HIGH (ref 0–99)
NonHDL: 132.55
Total CHOL/HDL Ratio: 3
Triglycerides: 62 mg/dL (ref 0.0–149.0)
VLDL: 12.4 mg/dL (ref 0.0–40.0)

## 2023-09-12 MED ORDER — CYANOCOBALAMIN 1000 MCG/ML IJ SOLN
1000.0000 ug | Freq: Once | INTRAMUSCULAR | Status: AC
Start: 2023-09-12 — End: 2023-09-12
  Administered 2023-09-12: 1000 ug via INTRAMUSCULAR

## 2023-09-12 NOTE — Addendum Note (Signed)
Addended by: Kern Reap B on: 09/12/2023 03:56 PM   Modules accepted: Orders

## 2023-09-12 NOTE — Progress Notes (Signed)
Established Patient Office Visit     CC/Reason for Visit: Follow-up chronic conditions  HPI: Bethany Cole is a 72 y.o. female who is coming in today for the above mentioned reasons. Past Medical History is significant for: Hypertension, hyperlipidemia, osteoporosis, GERD, OSA on CPAP, insomnia.  Also history of vitamin B12 deficiency and is due for B12 injection today.  She is due to recheck lipids after working on lifestyle changes over the past 6 months.  Her sleep physician just increased her sleeping medication 3 days ago.   Past Medical/Surgical History: Past Medical History:  Diagnosis Date   Allergy    Anemia    Anxiety    Arthritis    Cataracts, bilateral    Gastritis    GERD (gastroesophageal reflux disease)    Heart murmur    Hypertension    Hyperthyroidism    OSA on CPAP    Sleep apnea    Squamous cell carcinoma of skin     Past Surgical History:  Procedure Laterality Date   ABDOMINAL HYSTERECTOMY     ENDOMETRIAL ABLATION     EYE SURGERY     HERNIA REPAIR     as an infant   UPPER GASTROINTESTINAL ENDOSCOPY  11/09/2021   Has had sevearal in [place]   WRIST ARTHROSCOPY Right     Social History:  reports that she has never smoked. She has never used smokeless tobacco. She reports current alcohol use of about 1.0 standard drink of alcohol per week. She reports that she does not use drugs.  Allergies: Allergies  Allergen Reactions   Doxycycline Nausea And Vomiting   Erythromycin    Macrobid [Nitrofurantoin]    Penicillins    Sulfa Antibiotics     Family History:  Family History  Problem Relation Age of Onset   Ovarian cancer Mother    Stomach cancer Mother        mets from ovarian cancer   Arthritis Mother    Cancer Mother    Hearing loss Mother    Hypertension Mother    Heart disease Father    Cancer - Other Father        laryngeal cancer   Alcohol abuse Father    Arthritis Father    Hyperlipidemia Father    Hypertension Father     Hyperlipidemia Brother    Colon cancer Neg Hx    Esophageal cancer Neg Hx    Pancreatic cancer Neg Hx    Liver disease Neg Hx    Colon polyps Neg Hx    Rectal cancer Neg Hx      Current Outpatient Medications:    acetaminophen (TYLENOL) 650 MG CR tablet, Take 650 mg by mouth at bedtime as needed for pain., Disp: , Rfl:    celecoxib (CELEBREX) 100 MG capsule, TAKE 1 CAPSULE(100 MG) BY MOUTH TWICE DAILY AS NEEDED FOR MODERATE PAIN, Disp: 60 capsule, Rfl: 1   Cyanocobalamin (VITAMIN B-12 IJ), Inject as directed every 30 (thirty) days., Disp: , Rfl:    denosumab (PROLIA) 60 MG/ML SOSY injection, Inject 60 mg into the skin every 6 (six) months. Took in December 2022., Disp: , Rfl:    diclofenac Sodium (VOLTAREN) 1 % GEL, Apply topically 4 (four) times daily., Disp: , Rfl:    dicyclomine (BENTYL) 10 MG capsule, Take 1 capsule (10 mg total) by mouth every 8 (eight) hours as needed (abdominal cramping/diarrhea)., Disp: 45 capsule, Rfl: 1   irbesartan (AVAPRO) 75 MG tablet, TAKE 1 TABLET(75 MG)  BY MOUTH DAILY, Disp: 90 tablet, Rfl: 1   Magnesium 400 MG TABS, Take 400 mg by mouth 3 (three) times daily., Disp: , Rfl:    methimazole (TAPAZOLE) 5 MG tablet, Take 0.5 tablets (2.5 mg total) by mouth as directed. Half a tablet Monday through Thursday only ,none the rest of the week, Disp: 26 tablet, Rfl: 2   pantoprazole (PROTONIX) 40 MG tablet, TAKE 1 TABLET(40 MG) BY MOUTH TWICE DAILY, Disp: 180 tablet, Rfl: 1   RESTASIS 0.05 % ophthalmic emulsion, 1 drop 2 (two) times daily., Disp: , Rfl:    zaleplon (SONATA) 5 MG capsule, 1 or 2 caps as needed for sleep, Disp: 30 capsule, Rfl: 3   fluticasone (FLONASE) 50 MCG/ACT nasal spray, Place 1 spray into both nostrils 2 (two) times daily., Disp: 16 g, Rfl: 0  Review of Systems:  Negative unless indicated in HPI.   Physical Exam: Vitals:   09/12/23 0941  BP: 120/84  Pulse: 65  Temp: (!) 97.4 F (36.3 C)  TempSrc: Oral  SpO2: 98%  Weight: 154 lb 6.4  oz (70 kg)    Body mass index is 26.92 kg/m.   Physical Exam Vitals reviewed.  Constitutional:      Appearance: Normal appearance.  HENT:     Head: Normocephalic and atraumatic.  Eyes:     Conjunctiva/sclera: Conjunctivae normal.     Pupils: Pupils are equal, round, and reactive to light.  Cardiovascular:     Rate and Rhythm: Normal rate and regular rhythm.  Pulmonary:     Effort: Pulmonary effort is normal.     Breath sounds: Normal breath sounds.  Skin:    General: Skin is warm and dry.  Neurological:     General: No focal deficit present.     Mental Status: She is alert and oriented to person, place, and time.  Psychiatric:        Mood and Affect: Mood normal.        Behavior: Behavior normal.        Thought Content: Thought content normal.        Judgment: Judgment normal.      Impression and Plan:  Primary hypertension  Vitamin B12 deficiency  Mixed hyperlipidemia -     Lipid panel  Primary insomnia  -Blood pressure is well-controlled on current. -B12 IM given today. -Check lipids today after working on lifestyle changes for the past 6 months.   Time spent:32 minutes reviewing chart, interviewing and examining patient and formulating plan of care.     Chaya Jan, MD [facility]

## 2023-09-14 ENCOUNTER — Other Ambulatory Visit: Payer: Self-pay | Admitting: *Deleted

## 2023-09-14 ENCOUNTER — Encounter: Payer: Self-pay | Admitting: Physical Therapy

## 2023-09-14 ENCOUNTER — Ambulatory Visit: Payer: Medicare Other | Admitting: Physical Therapy

## 2023-09-14 DIAGNOSIS — M6281 Muscle weakness (generalized): Secondary | ICD-10-CM

## 2023-09-14 DIAGNOSIS — M545 Low back pain, unspecified: Secondary | ICD-10-CM

## 2023-09-14 DIAGNOSIS — M25552 Pain in left hip: Secondary | ICD-10-CM | POA: Diagnosis not present

## 2023-09-14 DIAGNOSIS — G8929 Other chronic pain: Secondary | ICD-10-CM | POA: Diagnosis not present

## 2023-09-14 DIAGNOSIS — E782 Mixed hyperlipidemia: Secondary | ICD-10-CM

## 2023-09-14 DIAGNOSIS — M5459 Other low back pain: Secondary | ICD-10-CM | POA: Diagnosis not present

## 2023-09-14 DIAGNOSIS — M25551 Pain in right hip: Secondary | ICD-10-CM

## 2023-09-14 MED ORDER — ATORVASTATIN CALCIUM 20 MG PO TABS: 20.0000 mg | ORAL_TABLET | Freq: Every day | ORAL | 1 refills | Status: DC

## 2023-09-14 NOTE — Therapy (Signed)
OUTPATIENT PHYSICAL THERAPY THORACOLUMBAR TREATMENT   Patient Name: Bethany Cole MRN: [id number] DOB:1951/08/28, 72 y.o., female Today's Date: 09/14/2023  END OF SESSION:  PT End of Session - 09/14/23 0936     Visit Number 8    Number of Visits 16    Date for PT Re-Evaluation 11/07/23    Authorization Type [employer] auth required- auth approved 16 visits 08/15/2023 - 11/07/2023    Authorization - Visit Number 8    Authorization - Number of Visits 16    Progress Note Due on Visit 10    PT Start Time 0936    PT Stop Time 1014    PT Time Calculation (min) 38 min    Activity Tolerance Patient tolerated treatment well;No increased pain    Behavior During Therapy WFL for tasks assessed/performed               Past Medical History:  Diagnosis Date   Allergy    Anemia    Anxiety    Arthritis    Cataracts, bilateral    Gastritis    GERD (gastroesophageal reflux disease)    Heart murmur    Hypertension    Hyperthyroidism    OSA on CPAP    Sleep apnea    Squamous cell carcinoma of skin    Past Surgical History:  Procedure Laterality Date   ABDOMINAL HYSTERECTOMY     ENDOMETRIAL ABLATION     EYE SURGERY     HERNIA REPAIR     as an infant   UPPER GASTROINTESTINAL ENDOSCOPY  11/09/2021   Has had sevearal in [place]   WRIST ARTHROSCOPY Right    Patient Active Problem List   Diagnosis Date Noted   Insomnia 06/07/2023   Acute maxillary sinusitis 08/27/2022   Chronic pain of both feet 11/13/2021   Leg length discrepancy 11/13/2021   Vitamin B12 deficiency 09/02/2021   OSA on CPAP    Obesity hypoventilation syndrome (HCC)    Fall 11/17/2020   Hyperlipemia 11/07/2020   Osteoporosis 09/30/2020   Hyperthyroidism    Hypertension    GERD (gastroesophageal reflux disease)    Gastritis    Cataracts, bilateral     PCP: Philip Aspen, Limmie Patricia, MD  REFERRING PROVIDER: Rodolph Bong, MD  REFERRING DIAG: M54.42,M54.41,G89.29 (ICD-10-CM) - Chronic bilateral low back  pain with bilateral sciatica M25.551,M25.552 (ICD-10-CM) - Bilateral hip pain  Rationale for Evaluation and Treatment: Rehabilitation  THERAPY DIAG:  Bilateral hip pain  Muscle weakness (generalized)  Other low back pain  Chronic midline low back pain without sciatica  ONSET DATE: summer 2023  SUBJECTIVE:  SUBJECTIVE STATEMENT: 09/14/2023  States she has been in a little bit of pain. States she did some outdoor work yesterday and didn't sleep well last night due to discomfort. States over the weekend she did a major clean out and misplaced her HEP.   Eval: Pain keeps changing. 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 Pain came on suddenly last year. States she also has stenosis and a cyst in her low back. State she has never been strong in her core. She feels strength will help with her pain. States she sometimes has arthritis pain and sometimes she has nerve pain. Previous pain was mostly in her right leg. States that now it is also in her left hip. Reports she recently received a left hip injection and that has helped. States she doesn't exercise much but she does walk but it hurts when she walks. She knows the move she holds herself up the less pain she has. Going up slight grades/inclines is challenging and painful.  States she has 2 dogs and they have to be walked separate and she needs to make sure they get their daily walks. Walk totals about an hour.  Some days she can't walk because she is either so exhausted or in too much pain.   Nerve pain can go around the leg into the groin on right but not recently.  Now leg pain is mostly in the lower left leg and not in the groin.    Reports that gardening is too difficult for her to do. Can't do anything where she has to pull or lift (bags of compost).   Has  a niece that's 82 years old with CP and not walking - currently crawling - needs to be able to lift/assist her into chair - 60#    PERTINENT HISTORY:  HTN, anxiety, abdominal hysterectomy (hx of 5-6 abdominal/vaginal surgeries for endometriosis), hx right ankle fracture  PAIN:  Are you having pain? Yes: NPRS scale: 6.5/10 Pain location: R hip   Pain description: sore   Aggravating factors: unsure- maybe anything with hips flexed, sitting or sleeping position Relieving factors: walking, less sitting, activity, maybe HEP   PRECAUTIONS: None  RED FLAGS: None   WEIGHT BEARING RESTRICTIONS: No  FALLS:  Has patient fallen in last 6 months? No  LIVING ENVIRONMENT: Lives with: lifelong friend Lives in: House/apartment Stairs: No Has following equipment at home: Grab bars  OCCUPATION: retired, likes to walk, has 2 dogs that she likes to walk  PLOF: Independent, loves to garden   PATIENT GOALS: to be able to walk her dogs regularly and have stronger core    OBJECTIVE:  Note: Objective measures were completed at Evaluation unless otherwise noted.  DIAGNOSTIC FINDINGS:  08/10/23 hip xray - awaiting results  PATIENT SURVEYS:  FOTO 26%  SCREENING FOR RED FLAGS: Bowel or bladder incontinence: No Spinal tumors: No Cauda equina syndrome: No Compression fracture: No Abdominal aneurysm: No  COGNITION: Overall cognitive status: Within functional limits for tasks assessed     SENSATION: Not tested    POSTURE: rounded shoulders, forward head, increased thoracic kyphosis, posterior pelvic tilt, and flexed trunk   PALPATION: No tender spots observed on this date  LUMBAR ROM:   AROM eval  Flexion 75% limited*  Extension 90% limited*  Right lateral flexion 50% limited*  Left lateral flexion 75% limited* - favors flexion  Right rotation   Left rotation    (Blank rows = not tested)    LE Measurements Lower Extremity Right EVAL Left  EVAL   A/PROM MMT A/PROM MMT   Hip Flexion 95* 3 95* 3  Hip Extension      Hip Abduction      Hip Adduction      Hip Internal rotation (in prone) 60*  60*   Hip External rotation (in prone) 30*  10*   Knee Flexion  3+*  3+*  Knee Extension  3+*  3+*  Ankle Dorsiflexion  3+*  3+*  Ankle Plantarflexion      Ankle Inversion      Ankle Eversion       (Blank rows = not tested) * pain   LUMBAR SPECIAL TESTS:  Straight leg raise test: + B and Slump test: + B  Breathing Assessment- belly breather- minimal diaphragmatica excursion noted with longer breathes  FUNCTIONAL TESTS:  Holds breath with all transitional movements    TODAY'S TREATMENT:                                                                                                                              DATE:   09/14/2023   Therapeutic Exercise:   Supine: 90/90 relief with breathing 3 minutes, DKC on ball 3 minutes,  piriformis stretch x3 30" holds, LTR 2 minutes  Review of HEP  Prone:    Seated:  goddess pose 3 minutes total no arms  Standing:   Neuromuscular Re-education:   Manual Therapy: IASTM - percussion gun to hips B - tolerated well  Therapeutic Activity:     Self Care: Trigger Point Dry Needling:  Modalities: MHP to lx spine x10 minutes during   supine exercises    PATIENT EDUCATION:  Education details: on HEP  Person educated: Patient Education method: Programmer, multimedia, Facilities manager, and Handouts Education comprehension: verbalized understanding   HOME EXERCISE PROGRAM: Long exhale breathing, [id number]  ASSESSMENT:  CLINICAL IMPRESSION: 09/14/2023 Reviewed exercises and printed off another set of HEP for HEP adherence. Focused on pain management today which was tolerated well. Significantly less pain and soreness noted end of session. Added modified goddess pose to HEP. Overall patient doing well and will continue to benefit from skilled PT at this time.   EVAL: Patient presents to physical therapy with chronic low back pain  that refers into both legs inconsistently.  Patient with history of multiple abdominal/vaginal surgeries and poor tolerance to hip range of motion and overall interventions on this date.  Educated patient in rationale behind breathing mechanics to help promote core activation during the day.  Tolerated this exercise well and it was added to home exercise program.  Patient presents with limitations in range of motion, strength and posture that are likely contributing to current presentation and would greatly benefit from skilled PT to address physical impairments and improve overall function and quality of life.  OBJECTIVE IMPAIRMENTS: decreased activity tolerance, difficulty walking, decreased ROM, decreased strength, improper body mechanics, postural dysfunction, and pain.   ACTIVITY LIMITATIONS: carrying, lifting, bending, standing, squatting, transfers, bed  mobility, and locomotion level  PARTICIPATION LIMITATIONS: cleaning, community activity, yard work, and walking dogs  PERSONAL FACTORS: Age, Fitness, Time since onset of injury/illness/exacerbation, and 1 comorbidity: multiple abdominal surgeries  are also affecting patient's functional outcome.   REHAB POTENTIAL: Good  CLINICAL DECISION MAKING: Stable/uncomplicated  EVALUATION COMPLEXITY: Low   GOALS: Goals reviewed with patient? yes  SHORT TERM GOALS: Target date: 09/26/2023  Patient will be independent in self management strategies to improve quality of life and functional outcomes. Baseline: New Program Goal status: INITIAL  2.  Patient will report at least 50% improvement in overall symptoms and/or function to demonstrate improved functional mobility Baseline: 0% better Goal status: INITIAL  3.  Patient will be able to regularly walk both dogs without having to limit herself due to pain to return to prior level of function Baseline: Unable Goal status: INITIAL  4.  Patient will demonstrate pain-free lower extremity manual  muscle testing Baseline: Painful Goal status: INITIAL    LONG TERM GOALS: Target date: 11/07/2023   Patient will report at least 75% improvement in overall symptoms and/or function to demonstrate improved functional mobility Baseline: 0% better Goal status: INITIAL  2.  Patient will improve score on FOTO outcomes measure to projected score to demonstrate overall improved function and QOL Baseline: see above Goal status: INITIAL  3.  Patient will be able to demonstrate breathing mechanics with use of obliques to improve core activation during regular tasks Baseline: Unable Goal status: INITIAL  4.  Patient will be able to demonstrate pain-free and lumbar range of motion in all directions. Baseline: Painful Goal status: INITIAL   PLAN:  PT FREQUENCY: 1-2x/week for a total of 16 visits  PT DURATION: 12 weeks  PLANNED INTERVENTIONS: 97110-Therapeutic exercises, 97530- Therapeutic activity, [id number]- Neuromuscular re-education, 97535- Self Care, 16109- Manual therapy, [phone]- Gait training, [phone]- Orthotic Fit/training, [phone]- Canalith repositioning, [id number]- Aquatic Therapy, 97014- Electrical stimulation (unattended), [phone]- Ionotophoresis 4mg /ml Dexamethasone, Patient/Family education, Balance training, Stair training, Taping, Dry Needling, Joint mobilization, Joint manipulation, Spinal manipulation, Spinal mobilization, Cryotherapy, and Moist heat   PLAN FOR NEXT SESSION:wants to be have clear idea of her feel good exercises and her maintainence  exercises.  review sit bones-percussion gun, SIJ, breathing exercises  score activation with breath work- progress breathing, add in UE for lifting and improved lifting 43 year old niece. Continue progressing hip strength    10:15 AM, 09/14/23 Tereasa Coop, DPT Physical Therapy with [facility]

## 2023-09-16 NOTE — Telephone Encounter (Signed)
Prolia VOB initiated via [email or web address]  Next Prolia inj DUE: 10/15/23

## 2023-09-19 NOTE — Telephone Encounter (Signed)
Prior Authorization REQUIRED for PROLIA   PA PROCESS DETAILS: Please complete the prior authorization form located at  [email or web address] or call [phone].

## 2023-09-19 NOTE — Telephone Encounter (Signed)
Pt ready for scheduling on or after 10/15/23  Out-of-pocket cost due at time of visit: $350  Primary: Medicare Prolia co-insurance: 20% (approximately $320.99) Admin fee co-insurance: $30  Deductible: does not apply  Prior Auth: APPROVED PA# [id number] Valid: 03/26/23-03/25/24  Secondary: N/A Prolia co-insurance:  Admin fee co-insurance:  Deductible:  Prior Auth:  PA# Valid:   ** This summary of benefits is an estimation of the patient's out-of-pocket cost. Exact cost may vary based on individual plan coverage.

## 2023-09-19 NOTE — Telephone Encounter (Signed)
Pt ready for scheduling on or after   Out-of-pocket cost due at time of visit: $346  Primary: [employer] Medicare Prolia co-insurance: 20% (approximately $320.99) Admin fee co-insurance: $30  Deductible: Does not apply  Secondary:  Prolia co-insurance:  Admin fee co-insurance:   Deductible:   Prior Auth: Required PA# Valid:   ** This summary of benefits is an estimation of the patient's out-of-pocket cost. Exact cost may vary based on individual plan coverage.

## 2023-09-19 NOTE — Telephone Encounter (Signed)
Prior Authorization on file and valid  Prior Auth (updated to [place]): APPROVED PA# [id number] Valid: 03/26/23-03/25/24

## 2023-09-20 ENCOUNTER — Ambulatory Visit: Payer: Medicare Other | Admitting: Family Medicine

## 2023-09-21 ENCOUNTER — Ambulatory Visit: Payer: Medicare Other | Admitting: Physical Therapy

## 2023-09-21 ENCOUNTER — Encounter: Payer: Self-pay | Admitting: Physical Therapy

## 2023-09-21 DIAGNOSIS — M5459 Other low back pain: Secondary | ICD-10-CM | POA: Diagnosis not present

## 2023-09-21 DIAGNOSIS — M545 Low back pain, unspecified: Secondary | ICD-10-CM

## 2023-09-21 DIAGNOSIS — G8929 Other chronic pain: Secondary | ICD-10-CM

## 2023-09-21 DIAGNOSIS — M6281 Muscle weakness (generalized): Secondary | ICD-10-CM | POA: Diagnosis not present

## 2023-09-21 DIAGNOSIS — M25551 Pain in right hip: Secondary | ICD-10-CM

## 2023-09-21 DIAGNOSIS — M25552 Pain in left hip: Secondary | ICD-10-CM

## 2023-09-21 NOTE — Therapy (Signed)
OUTPATIENT PHYSICAL THERAPY THORACOLUMBAR TREATMENT   Patient Name: Bethany Cole MRN: [id number] DOB:October 18, 1951, 72 y.o., female Today's Date: 09/21/2023  END OF SESSION:  PT End of Session - 09/21/23 1018     Visit Number 9    Number of Visits 16    Date for PT Re-Evaluation 11/07/23    Authorization Type [employer] auth required- auth approved 16 visits 08/15/2023 - 11/07/2023    Authorization - Visit Number 9    Authorization - Number of Visits 16    Progress Note Due on Visit 10    PT Start Time 1018    PT Stop Time 1056    PT Time Calculation (min) 38 min    Activity Tolerance Patient tolerated treatment well;No increased pain    Behavior During Therapy WFL for tasks assessed/performed               Past Medical History:  Diagnosis Date   Allergy    Anemia    Anxiety    Arthritis    Cataracts, bilateral    Gastritis    GERD (gastroesophageal reflux disease)    Heart murmur    Hypertension    Hyperthyroidism    OSA on CPAP    Sleep apnea    Squamous cell carcinoma of skin    Past Surgical History:  Procedure Laterality Date   ABDOMINAL HYSTERECTOMY     ENDOMETRIAL ABLATION     EYE SURGERY     HERNIA REPAIR     as an infant   UPPER GASTROINTESTINAL ENDOSCOPY  11/09/2021   Has had sevearal in [place]   WRIST ARTHROSCOPY Right    Patient Active Problem List   Diagnosis Date Noted   Insomnia 06/07/2023   Acute maxillary sinusitis 08/27/2022   Chronic pain of both feet 11/13/2021   Leg length discrepancy 11/13/2021   Vitamin B12 deficiency 09/02/2021   OSA on CPAP    Obesity hypoventilation syndrome (HCC)    Fall 11/17/2020   Hyperlipemia 11/07/2020   Osteoporosis 09/30/2020   Hyperthyroidism    Hypertension    GERD (gastroesophageal reflux disease)    Gastritis    Cataracts, bilateral     PCP: Philip Aspen, Limmie Patricia, MD  REFERRING PROVIDER: Rodolph Bong, MD  REFERRING DIAG: M54.42,M54.41,G89.29 (ICD-10-CM) - Chronic bilateral low back  pain with bilateral sciatica M25.551,M25.552 (ICD-10-CM) - Bilateral hip pain  Rationale for Evaluation and Treatment: Rehabilitation  THERAPY DIAG:  Bilateral hip pain  Muscle weakness (generalized)  Other low back pain  Chronic midline low back pain without sciatica  ONSET DATE: summer 2023  SUBJECTIVE:  SUBJECTIVE STATEMENT: 09/21/2023  States she is feeling OK but not great. States she has a couple of questions. Reports her left hip is great. States her right hip that is still bothering her. Reports she is not quite clear on what she should do for it. Reports on Sunday it was really bothering her. Stats she is still walking but had to cut back a little bit. Reports she typically has the arthritis pain but then occasional nerve pain.    Eval: Pain keeps changing. 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 Pain came on suddenly last year. States she also has stenosis and a cyst in her low back. State she has never been strong in her core. She feels strength will help with her pain. States she sometimes has arthritis pain and sometimes she has nerve pain. Previous pain was mostly in her right leg. States that now it is also in her left hip. Reports she recently received a left hip injection and that has helped. States she doesn't exercise much but she does walk but it hurts when she walks. She knows the move she holds herself up the less pain she has. Going up slight grades/inclines is challenging and painful.  States she has 2 dogs and they have to be walked separate and she needs to make sure they get their daily walks. Walk totals about an hour.  Some days she can't walk because she is either so exhausted or in too much pain.   Nerve pain can go around the leg into the groin on right but not recently.  Now leg  pain is mostly in the lower left leg and not in the groin.    Reports that gardening is too difficult for her to do. Can't do anything where she has to pull or lift (bags of compost).   Has a niece that's 73 years old with CP and not walking - currently crawling - needs to be able to lift/assist her into chair - 60#    PERTINENT HISTORY:  HTN, anxiety, abdominal hysterectomy (hx of 5-6 abdominal/vaginal surgeries for endometriosis), hx right ankle fracture  PAIN:  Are you having pain? Yes: NPRS scale: 6/10 Pain location: R hip   Pain description: sore   Aggravating factors: unsure- maybe anything with hips flexed, sitting or sleeping position Relieving factors: walking, less sitting, activity, maybe HEP   PRECAUTIONS: None  RED FLAGS: None   WEIGHT BEARING RESTRICTIONS: No  FALLS:  Has patient fallen in last 6 months? No  LIVING ENVIRONMENT: Lives with: lifelong friend Lives in: House/apartment Stairs: No Has following equipment at home: Grab bars  OCCUPATION: retired, likes to walk, has 2 dogs that she likes to walk  PLOF: Independent, loves to garden   PATIENT GOALS: to be able to walk her dogs regularly and have stronger core    OBJECTIVE:  Note: Objective measures were completed at Evaluation unless otherwise noted.  DIAGNOSTIC FINDINGS:  08/10/23 hip xray - awaiting results  PATIENT SURVEYS:  FOTO 26%  SCREENING FOR RED FLAGS: Bowel or bladder incontinence: No Spinal tumors: No Cauda equina syndrome: No Compression fracture: No Abdominal aneurysm: No  COGNITION: Overall cognitive status: Within functional limits for tasks assessed     SENSATION: Not tested    POSTURE: rounded shoulders, forward head, increased thoracic kyphosis, posterior pelvic tilt, and flexed trunk   PALPATION: No tender spots observed on this date  LUMBAR ROM:   AROM eval  Flexion 75% limited*  Extension 90% limited*  Right lateral  flexion 50% limited*  Left lateral  flexion 75% limited* - favors flexion  Right rotation   Left rotation    (Blank rows = not tested)    LE Measurements Lower Extremity Right EVAL Left EVAL   A/PROM MMT A/PROM MMT  Hip Flexion 95* 3 95* 3  Hip Extension      Hip Abduction      Hip Adduction      Hip Internal rotation (in prone) 60*  60*   Hip External rotation (in prone) 30*  10*   Knee Flexion  3+*  3+*  Knee Extension  3+*  3+*  Ankle Dorsiflexion  3+*  3+*  Ankle Plantarflexion      Ankle Inversion      Ankle Eversion       (Blank rows = not tested) * pain   LUMBAR SPECIAL TESTS:  Straight leg raise test: + B and Slump test: + B  Breathing Assessment- belly breather- minimal diaphragmatica excursion noted with longer breathes  FUNCTIONAL TESTS:  Holds breath with all transitional movements    TODAY'S TREATMENT:                                                                                                                              DATE:   09/21/2023   Therapeutic Exercise:  Reviewed HEP Supine: 90/90 relief with breathing 3 minutes,   piriformis stretch x3 30" holds, LTR 2 minutes  Review of HEP  Prone:    Seated:  goddess pose 3 minutes total no arms  Standing:   Neuromuscular Re-education:   Manual Therapy: IASTM - percussion gun to hips R - tolerated well  Therapeutic Activity:     Self Care: Trigger Point Dry Needling:  Modalities: MHP to lx spine x10 minutes during   supine exercises    PATIENT EDUCATION:  Education details: on HEP, on difference between feel good exercises and strengthening exercises.  Person educated: Patient Education method: Explanation, Demonstration, and Handouts Education comprehension: verbalized understanding   HOME EXERCISE PROGRAM: Long exhale breathing, [id number]  ASSESSMENT:  CLINICAL IMPRESSION: 09/21/2023 Focused on pain management strategies and answering all questions. Continues to benefit from heat and percussion best. Discussed upcoming  MD visit and discussion about continued right hip pain. Overall patient doing better then start of PT and will continue to benefit from skilled PT at this time    EVAL: Patient presents to physical therapy with chronic low back pain that refers into both legs inconsistently.  Patient with history of multiple abdominal/vaginal surgeries and poor tolerance to hip range of motion and overall interventions on this date.  Educated patient in rationale behind breathing mechanics to help promote core activation during the day.  Tolerated this exercise well and it was added to home exercise program.  Patient presents with limitations in range of motion, strength and posture that are likely contributing to current presentation and would greatly benefit from skilled PT to address  physical impairments and improve overall function and quality of life.  OBJECTIVE IMPAIRMENTS: decreased activity tolerance, difficulty walking, decreased ROM, decreased strength, improper body mechanics, postural dysfunction, and pain.   ACTIVITY LIMITATIONS: carrying, lifting, bending, standing, squatting, transfers, bed mobility, and locomotion level  PARTICIPATION LIMITATIONS: cleaning, community activity, yard work, and walking dogs  PERSONAL FACTORS: Age, Fitness, Time since onset of injury/illness/exacerbation, and 1 comorbidity: multiple abdominal surgeries  are also affecting patient's functional outcome.   REHAB POTENTIAL: Good  CLINICAL DECISION MAKING: Stable/uncomplicated  EVALUATION COMPLEXITY: Low   GOALS: Goals reviewed with patient? yes  SHORT TERM GOALS: Target date: 09/26/2023  Patient will be independent in self management strategies to improve quality of life and functional outcomes. Baseline: New Program Goal status: INITIAL  2.  Patient will report at least 50% improvement in overall symptoms and/or function to demonstrate improved functional mobility Baseline: 0% better Goal status: INITIAL  3.   Patient will be able to regularly walk both dogs without having to limit herself due to pain to return to prior level of function Baseline: Unable Goal status: INITIAL  4.  Patient will demonstrate pain-free lower extremity manual muscle testing Baseline: Painful Goal status: INITIAL    LONG TERM GOALS: Target date: 11/07/2023   Patient will report at least 75% improvement in overall symptoms and/or function to demonstrate improved functional mobility Baseline: 0% better Goal status: INITIAL  2.  Patient will improve score on FOTO outcomes measure to projected score to demonstrate overall improved function and QOL Baseline: see above Goal status: INITIAL  3.  Patient will be able to demonstrate breathing mechanics with use of obliques to improve core activation during regular tasks Baseline: Unable Goal status: INITIAL  4.  Patient will be able to demonstrate pain-free and lumbar range of motion in all directions. Baseline: Painful Goal status: INITIAL   PLAN:  PT FREQUENCY: 1-2x/week for a total of 16 visits  PT DURATION: 12 weeks  PLANNED INTERVENTIONS: 97110-Therapeutic exercises, 97530- Therapeutic activity, [id number]- Neuromuscular re-education, 97535- Self Care, 16109- Manual therapy, [phone]- Gait training, [phone]- Orthotic Fit/training, [phone]- Canalith repositioning, [id number]- Aquatic Therapy, 97014- Electrical stimulation (unattended), [phone]- Ionotophoresis 4mg /ml Dexamethasone, Patient/Family education, Balance training, Stair training, Taping, Dry Needling, Joint mobilization, Joint manipulation, Spinal manipulation, Spinal mobilization, Cryotherapy, and Moist heat   PLAN FOR NEXT SESSION: PN  :wants to be have clear idea of her feel good exercises and her maintainence  exercises.  review sit bones-percussion gun, SIJ, breathing exercises  score activation with breath work- progress breathing, add in UE for lifting and improved lifting 27 year old niece. Continue progressing  hip strength    11:04 AM, 09/21/23 Tereasa Coop, DPT Physical Therapy with [facility]

## 2023-09-26 ENCOUNTER — Telehealth: Payer: Self-pay

## 2023-09-26 ENCOUNTER — Encounter: Payer: Self-pay | Admitting: Family Medicine

## 2023-09-26 ENCOUNTER — Ambulatory Visit: Payer: Medicare Other | Admitting: Family Medicine

## 2023-09-26 VITALS — BP 136/84 | HR 61 | Ht 63.5 in | Wt 175.0 lb

## 2023-09-26 DIAGNOSIS — M25551 Pain in right hip: Secondary | ICD-10-CM

## 2023-09-26 DIAGNOSIS — M5441 Lumbago with sciatica, right side: Secondary | ICD-10-CM

## 2023-09-26 DIAGNOSIS — G8929 Other chronic pain: Secondary | ICD-10-CM | POA: Diagnosis not present

## 2023-09-26 DIAGNOSIS — M25552 Pain in left hip: Secondary | ICD-10-CM | POA: Diagnosis not present

## 2023-09-26 DIAGNOSIS — M810 Age-related osteoporosis without current pathological fracture: Secondary | ICD-10-CM

## 2023-09-26 DIAGNOSIS — M5442 Lumbago with sciatica, left side: Secondary | ICD-10-CM

## 2023-09-26 LAB — COMPREHENSIVE METABOLIC PANEL
ALT: 16 U/L (ref 0–35)
AST: 23 U/L (ref 0–37)
Albumin: 4.3 g/dL (ref 3.5–5.2)
Alkaline Phosphatase: 51 U/L (ref 39–117)
BUN: 13 mg/dL (ref 6–23)
CO2: 28 meq/L (ref 19–32)
Calcium: 9.5 mg/dL (ref 8.4–10.5)
Chloride: 97 meq/L (ref 96–112)
Creatinine, Ser: 0.68 mg/dL (ref 0.40–1.20)
GFR: 86.14 mL/min (ref >60.00)
Glucose, Bld: 88 mg/dL (ref 70–99)
Potassium: 4.7 meq/L (ref 3.5–5.1)
Sodium: 131 meq/L — ABNORMAL LOW (ref 135–145)
Total Bilirubin: 0.4 mg/dL (ref 0.2–1.2)
Total Protein: 6.9 g/dL (ref 6.0–8.3)

## 2023-09-26 LAB — VITAMIN D 25 HYDROXY (VIT D DEFICIENCY, FRACTURES): VITD: 59 ng/mL (ref 30.00–100.00)

## 2023-09-26 LAB — PHOSPHORUS: Phosphorus: 3.3 mg/dL (ref 2.3–4.6)

## 2023-09-26 LAB — MAGNESIUM: Magnesium: 1.9 mg/dL (ref 1.5–2.5)

## 2023-09-26 NOTE — Patient Instructions (Signed)
Thank you for coming in today.   Please get labs today before you leave   Schedule a nurse visit for Prolia on or after Dec 20th.   Continue home exercises and PT. I will authorize you extending PT with Marcelino Duster. Let me know if I need to place a new order.   Recheck with me as needed.

## 2023-09-26 NOTE — Progress Notes (Signed)
   I, Stevenson Clinch, CMA acting as a scribe for Clementeen Graham, MD.  Bethany Cole is a 72 y.o. female who presents to [facility] today for f/u L hip and low back pain. Pt was last seen by Dr. Denyse Amass on 08/10/23 and was given a L GT steroid injection. She is due for her next Prolia injection on Dec 20th.  Today, pt reports significant improvement of hip sx with PT, has 2 visits remaining. Compliant with HEP, working on specific exercises for flare-ups. Nerve-type sx are occurring less often. Has been able to keep walking. Continues to have stiffness in the lower back, exacerbated with activity.   Dx testing: 08/10/23 Labs 04/26/23 L-spine MRI 04/11/23 DEXA scan             10/24/23 L-spine XR  Pertinent review of systems: No fevers or chills  Relevant historical information: Hypertension   Exam:  BP 136/84   Pulse 61   Ht 5' 3.5" (1.613 m)   Wt 175 lb (79.4 kg)   SpO2 100%   BMI 30.51 kg/m  General: Well Developed, well nourished, and in no acute distress.   MSK: Left hip normal motion      Assessment and Plan: 72 y.o. female with left hip and low back pain improving with PT.  She think she is getting need a bit more PT which is reasonable.  Will authorize PT.  Osteoporosis due for Prolia on or after December 20.  Will get pre-Prolia labs now.  She can schedule nurse visit for Prolia on or after December 20.   PDMP not reviewed this encounter. No orders of the defined types were placed in this encounter.  No orders of the defined types were placed in this encounter.    Discussed warning signs or symptoms. Please see discharge instructions. Patient expresses understanding.   The above documentation has been reviewed and is accurate and complete Clementeen Graham, M.D.

## 2023-09-26 NOTE — Telephone Encounter (Signed)
Last Prolia inj 04/14/23

## 2023-09-27 NOTE — Telephone Encounter (Signed)
 Prolia VOB initiated via [email or web address]  Next Prolia inj DUE: 10/15/23

## 2023-09-27 NOTE — Progress Notes (Signed)
Vitamin D is acceptable.  Chemistry is stable over the last year with no added limitation of kidney function.  Magnesium and phosphorus are normal.  Okay to proceed with Prolia.

## 2023-09-28 ENCOUNTER — Encounter: Payer: Self-pay | Admitting: Physical Therapy

## 2023-09-28 ENCOUNTER — Ambulatory Visit: Payer: Medicare Other | Admitting: Physical Therapy

## 2023-09-28 DIAGNOSIS — M25552 Pain in left hip: Secondary | ICD-10-CM | POA: Diagnosis not present

## 2023-09-28 DIAGNOSIS — M5459 Other low back pain: Secondary | ICD-10-CM | POA: Diagnosis not present

## 2023-09-28 DIAGNOSIS — M25551 Pain in right hip: Secondary | ICD-10-CM

## 2023-09-28 DIAGNOSIS — M6281 Muscle weakness (generalized): Secondary | ICD-10-CM

## 2023-09-28 NOTE — Therapy (Signed)
OUTPATIENT PHYSICAL THERAPY THORACOLUMBAR TREATMENT   Patient Name: Bethany Cole MRN: [id number] DOB:Jul 14, 1951, 72 y.o., female Today's Date: 09/28/2023  END OF SESSION:  PT End of Session - 09/28/23 0933     Visit Number 10    Number of Visits 16    Date for PT Re-Evaluation 11/07/23    Authorization Type [employer] auth required- auth approved 16 visits 08/15/2023 - 11/07/2023    Authorization - Visit Number 10    Authorization - Number of Visits 16    Progress Note Due on Visit 10    PT Start Time 0934    PT Stop Time 1014    PT Time Calculation (min) 40 min    Activity Tolerance Patient tolerated treatment well;No increased pain    Behavior During Therapy WFL for tasks assessed/performed               Past Medical History:  Diagnosis Date   Allergy    Anemia    Anxiety    Arthritis    Cataracts, bilateral    Gastritis    GERD (gastroesophageal reflux disease)    Heart murmur    Hypertension    Hyperthyroidism    OSA on CPAP    Sleep apnea    Squamous cell carcinoma of skin    Past Surgical History:  Procedure Laterality Date   ABDOMINAL HYSTERECTOMY     ENDOMETRIAL ABLATION     EYE SURGERY     HERNIA REPAIR     as an infant   UPPER GASTROINTESTINAL ENDOSCOPY  11/09/2021   Has had sevearal in [place]   WRIST ARTHROSCOPY Right    Patient Active Problem List   Diagnosis Date Noted   Insomnia 06/07/2023   Acute maxillary sinusitis 08/27/2022   Chronic pain of both feet 11/13/2021   Leg length discrepancy 11/13/2021   Vitamin B12 deficiency 09/02/2021   OSA on CPAP    Obesity hypoventilation syndrome (HCC)    Fall 11/17/2020   Hyperlipemia 11/07/2020   Osteoporosis 09/30/2020   Hyperthyroidism    Hypertension    GERD (gastroesophageal reflux disease)    Gastritis    Cataracts, bilateral     PCP: Philip Aspen, Limmie Patricia, MD  REFERRING PROVIDER: Rodolph Bong, MD  REFERRING DIAG: M54.42,M54.41,G89.29 (ICD-10-CM) - Chronic bilateral low back  pain with bilateral sciatica M25.551,M25.552 (ICD-10-CM) - Bilateral hip pain  Rationale for Evaluation and Treatment: Rehabilitation  THERAPY DIAG:  Bilateral hip pain  Muscle weakness (generalized)  Other low back pain  ONSET DATE: summer 2023  SUBJECTIVE:  SUBJECTIVE STATEMENT: 09/28/2023  States she saw the MD and they feel PT needs to continue. States she has pain in her right hip today. In the morning it seems to have something to do with the last sleep position she was in. States after she sits to drink her coffee then her hip started to hurt. States she is in a rocking chair with padding.    Eval: Pain keeps changing. 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 Pain came on suddenly last year. States she also has stenosis and a cyst in her low back. State she has never been strong in her core. She feels strength will help with her pain. States she sometimes has arthritis pain and sometimes she has nerve pain. Previous pain was mostly in her right leg. States that now it is also in her left hip. Reports she recently received a left hip injection and that has helped. States she doesn't exercise much but she does walk but it hurts when she walks. She knows the move she holds herself up the less pain she has. Going up slight grades/inclines is challenging and painful.  States she has 2 dogs and they have to be walked separate and she needs to make sure they get their daily walks. Walk totals about an hour.  Some days she can't walk because she is either so exhausted or in too much pain.   Nerve pain can go around the leg into the groin on right but not recently.  Now leg pain is mostly in the lower left leg and not in the groin.    Reports that gardening is too difficult for her to do. Can't do anything where she  has to pull or lift (bags of compost).   Has a niece that's 67 years old with CP and not walking - currently crawling - needs to be able to lift/assist her into chair - 60#    PERTINENT HISTORY:  HTN, anxiety, abdominal hysterectomy (hx of 5-6 abdominal/vaginal surgeries for endometriosis), hx right ankle fracture  PAIN:  Are you having pain? Yes: NPRS scale: 6/10 Pain location: R hip   Pain description: sore   Aggravating factors: unsure- maybe anything with hips flexed, sitting or sleeping position Relieving factors: walking, less sitting, activity, maybe HEP   PRECAUTIONS: None  RED FLAGS: None   WEIGHT BEARING RESTRICTIONS: No  FALLS:  Has patient fallen in last 6 months? No  LIVING ENVIRONMENT: Lives with: lifelong friend Lives in: House/apartment Stairs: No Has following equipment at home: Grab bars  OCCUPATION: retired, likes to walk, has 2 dogs that she likes to walk  PLOF: Independent, loves to garden   PATIENT GOALS: to be able to walk her dogs regularly and have stronger core    OBJECTIVE:  Note: Objective measures were completed at Evaluation unless otherwise noted.  DIAGNOSTIC FINDINGS:  08/10/23 hip xray - awaiting results  PATIENT SURVEYS:  FOTO 26%  SCREENING FOR RED FLAGS: Bowel or bladder incontinence: No Spinal tumors: No Cauda equina syndrome: No Compression fracture: No Abdominal aneurysm: No  COGNITION: Overall cognitive status: Within functional limits for tasks assessed     SENSATION: Not tested    POSTURE: rounded shoulders, forward head, increased thoracic kyphosis, posterior pelvic tilt, and flexed trunk   PALPATION: No tender spots observed on this date  LUMBAR ROM:   AROM eval  Flexion 75% limited*  Extension 90% limited*  Right lateral flexion 50% limited*  Left lateral flexion 75% limited* - favors flexion  Right  rotation   Left rotation    (Blank rows = not tested)    LE Measurements Lower Extremity  Right EVAL Left EVAL   A/PROM MMT A/PROM MMT  Hip Flexion 95* 3 95* 3  Hip Extension      Hip Abduction      Hip Adduction      Hip Internal rotation (in prone) 60*  60*   Hip External rotation (in prone) 30*  10*   Knee Flexion  3+*  3+*  Knee Extension  3+*  3+*  Ankle Dorsiflexion  3+*  3+*  Ankle Plantarflexion      Ankle Inversion      Ankle Eversion       (Blank rows = not tested) * pain   LUMBAR SPECIAL TESTS:  Straight leg raise test: + B and Slump test: + B  Breathing Assessment- belly breather- minimal diaphragmatica excursion noted with longer breathes  FUNCTIONAL TESTS:  Holds breath with all transitional movements    TODAY'S TREATMENT:                                                                                                                              DATE:   09/28/2023   Therapeutic Exercise:  Reviewed HEP Supine  Prone:    Seated: piriformis stretch B - not tolerated well. Stopped in seated position. Goddess pose x5 10" holds  Standing:   Neuromuscular Re-education:  ergonomic set up in different chair  20 minutes with practice and education, on sleeping postures and positions 10 minutes with practice and education Manual Therapy:  Therapeutic Activity:     Self Care: Trigger Point Dry Needling:  Modalities:    PATIENT EDUCATION:  Education details: on HEP, on posture and ergonomic set up.  On sleep postures and positions and how to offload different pressure points in different positions Person educated: Patient Education method: Explanation, Demonstration, and Handouts Education comprehension: verbalized understanding   HOME EXERCISE PROGRAM: Long exhale breathing, [id number]  ASSESSMENT:  CLINICAL IMPRESSION: 09/28/2023 Session focused on education of positional relief strategies both in bed and while sitting in a chair in the morning as these are the most provocative positions for patient increasing pain.  Discussed alleviation of  pressure points as well as movement to improve blood flow to these areas to reduce pain.  Tolerated goddess exercises well reporting reduced pain after using this exercise during sitting.  Overall patient doing well and will continue to benefit from skilled PT at this time.    EVAL: Patient presents to physical therapy with chronic low back pain that refers into both legs inconsistently.  Patient with history of multiple abdominal/vaginal surgeries and poor tolerance to hip range of motion and overall interventions on this date.  Educated patient in rationale behind breathing mechanics to help promote core activation during the day.  Tolerated this exercise well and it was added to home exercise program.  Patient presents with limitations in  range of motion, strength and posture that are likely contributing to current presentation and would greatly benefit from skilled PT to address physical impairments and improve overall function and quality of life.  OBJECTIVE IMPAIRMENTS: decreased activity tolerance, difficulty walking, decreased ROM, decreased strength, improper body mechanics, postural dysfunction, and pain.   ACTIVITY LIMITATIONS: carrying, lifting, bending, standing, squatting, transfers, bed mobility, and locomotion level  PARTICIPATION LIMITATIONS: cleaning, community activity, yard work, and walking dogs  PERSONAL FACTORS: Age, Fitness, Time since onset of injury/illness/exacerbation, and 1 comorbidity: multiple abdominal surgeries  are also affecting patient's functional outcome.   REHAB POTENTIAL: Good  CLINICAL DECISION MAKING: Stable/uncomplicated  EVALUATION COMPLEXITY: Low   GOALS: Goals reviewed with patient? yes  SHORT TERM GOALS: Target date: 09/26/2023  Patient will be independent in self management strategies to improve quality of life and functional outcomes. Baseline: New Program Goal status: INITIAL  2.  Patient will report at least 50% improvement in overall  symptoms and/or function to demonstrate improved functional mobility Baseline: 0% better Goal status: INITIAL  3.  Patient will be able to regularly walk both dogs without having to limit herself due to pain to return to prior level of function Baseline: Unable Goal status: INITIAL  4.  Patient will demonstrate pain-free lower extremity manual muscle testing Baseline: Painful Goal status: INITIAL    LONG TERM GOALS: Target date: 11/07/2023   Patient will report at least 75% improvement in overall symptoms and/or function to demonstrate improved functional mobility Baseline: 0% better Goal status: INITIAL  2.  Patient will improve score on FOTO outcomes measure to projected score to demonstrate overall improved function and QOL Baseline: see above Goal status: INITIAL  3.  Patient will be able to demonstrate breathing mechanics with use of obliques to improve core activation during regular tasks Baseline: Unable Goal status: INITIAL  4.  Patient will be able to demonstrate pain-free and lumbar range of motion in all directions. Baseline: Painful Goal status: INITIAL   PLAN:  PT FREQUENCY: 1-2x/week for a total of 16 visits  PT DURATION: 12 weeks  PLANNED INTERVENTIONS: 97110-Therapeutic exercises, 97530- Therapeutic activity, [id number]- Neuromuscular re-education, 97535- Self Care, 16109- Manual therapy, [phone]- Gait training, [phone]- Orthotic Fit/training, [phone]- Canalith repositioning, [id number]- Aquatic Therapy, 97014- Electrical stimulation (unattended), [phone]- Ionotophoresis 4mg /ml Dexamethasone, Patient/Family education, Balance training, Stair training, Taping, Dry Needling, Joint mobilization, Joint manipulation, Spinal manipulation, Spinal mobilization, Cryotherapy, and Moist heat   PLAN FOR NEXT SESSION: PN-check with patient to see if patient can change from the last visit to January 13 to stay within POC  :wants to be have clear idea of her feel good exercises and her  maintainence  exercises.  review sit bones-percussion gun, SIJ, breathing exercises  score activation with breath work- progress breathing, add in UE for lifting and improved lifting 20 year old niece. Continue progressing hip strength    10:59 AM, 09/28/23 Tereasa Coop, DPT Physical Therapy with [facility]

## 2023-09-28 NOTE — Telephone Encounter (Signed)
Results note:   Vitamin D is acceptable.  Chemistry is stable over the last year with no added limitation of kidney function.  Magnesium and phosphorus are normal.  Okay to proceed with Prolia.  Written by Rodolph Bong, MD on 09/27/2023  6:43 AM EST

## 2023-09-28 NOTE — Telephone Encounter (Signed)
 Prior Authorization REQUIRED for PROLIA   PA PROCESS DETAILS: Please complete the prior authorization form located at  [email or web address] or call [phone].

## 2023-09-29 ENCOUNTER — Other Ambulatory Visit: Payer: Self-pay | Admitting: Family Medicine

## 2023-09-29 DIAGNOSIS — G8929 Other chronic pain: Secondary | ICD-10-CM

## 2023-09-29 NOTE — Telephone Encounter (Signed)
Last OV 09/26/23 Next OV 12.23.24  Last refill 6.27.24 Qty #60/1   CMP 12.2.24 Comprehensive metabolic panel Order: [id number] Status: Final result     Visible to patient: Yes (not seen)     Next appt: 10/05/2023 at 09:30 AM in Rehabilitation (LANDRY, MICHELE RENEE, PT)     Dx: Age-related osteoporosis without curr...   2 Result Notes     1 Patient Communication         Component Ref Range & Units 3 d ago (09/26/23) 6 mo ago (03/09/23) 1 yr ago (02/18/22) 2 yr ago (08/28/21) 2 yr ago (11/17/20) 2 yr ago (11/07/20)  Sodium 135 - 145 mEq/L 131 Low  132 Low  132 Low  135 139 R 137  Potassium 3.5 - 5.1 mEq/L 4.7 4.1 4.4 4.2 5.2 R 4.3  Chloride 96 - 112 mEq/L 97 97 97 100 101 R 101  CO2 19 - 32 mEq/L 28 28 28 30 19  Low  R 29  Glucose, Bld 70 - 99 mg/dL 88 90 90 213 High  84 R 90  BUN 6 - 23 mg/dL 13 14 16 13 12  R 15  Creatinine, Ser 0.40 - 1.20 mg/dL 0.86 5.78 4.69 6.29 5.28 R 0.73  Total Bilirubin 0.2 - 1.2 mg/dL 0.4 0.4 0.4 0.3 <4.1 R 0.5  Alkaline Phosphatase 39 - 117 U/L 51 40 46 55 113 R 82  AST 0 - 37 U/L 23 19 20 20  36 R 24  ALT 0 - 35 U/L 16 14 14 15 18  R 18  Total Protein 6.0 - 8.3 g/dL 6.9 6.7 6.8 6.4 7.0 R 6.6  Albumin 3.5 - 5.2 g/dL 4.3 4.2 4.4 4.3 4.5 R 4.4  GFR >60.00 mL/min 86.14 83.02 CM 87.43 CM 78.64 CM  83.00 CM  Comment: Calculated using the CKD-EPI Creatinine Equation (2021)  Calcium 8.4 - 10.5 mg/dL 9.5 9.3 9.6 9.5 9.6 R 9.5

## 2023-10-03 NOTE — Telephone Encounter (Addendum)
Prior Auth for [employer] APPROVED PA# [id number] Valid: 10/03/23-10/02/24  BUY AND BILL

## 2023-10-04 NOTE — Telephone Encounter (Signed)
Pt ready for scheduling on or after 10/15/23  Out-of-pocket cost due at time of visit: $351  Primary: Medicare Prolia co-insurance: 20% (approximately $320.99) Admin fee co-insurance: $30  Deductible: does not apply  Prior Auth APPROVED PA# [id number] Valid: 10/03/23-10/02/24  Secondary: N/A Prolia co-insurance:  Admin fee co-insurance:  Deductible:  Prior Auth:  PA# Valid:   ** This summary of benefits is an estimation of the patient's out-of-pocket cost. Exact cost may vary based on individual plan coverage.

## 2023-10-05 ENCOUNTER — Encounter: Payer: Medicare Other | Admitting: Physical Therapy

## 2023-10-11 ENCOUNTER — Ambulatory Visit (INDEPENDENT_AMBULATORY_CARE_PROVIDER_SITE_OTHER): Payer: Medicare Other

## 2023-10-11 DIAGNOSIS — E538 Deficiency of other specified B group vitamins: Secondary | ICD-10-CM | POA: Diagnosis not present

## 2023-10-11 MED ORDER — CYANOCOBALAMIN 1000 MCG/ML IJ SOLN
1000.0000 ug | Freq: Once | INTRAMUSCULAR | Status: AC
Start: 2023-10-11 — End: 2023-10-11
  Administered 2023-10-11: 1000 ug via INTRAMUSCULAR

## 2023-10-11 NOTE — Progress Notes (Signed)
Per orders of Dr. Ardyth Harps, injection of B-12 given by Stann Ore. Patient tolerated injection well.

## 2023-10-12 ENCOUNTER — Ambulatory Visit: Payer: Medicare Other | Admitting: Physical Therapy

## 2023-10-12 ENCOUNTER — Encounter: Payer: Self-pay | Admitting: Physical Therapy

## 2023-10-12 DIAGNOSIS — M6281 Muscle weakness (generalized): Secondary | ICD-10-CM | POA: Diagnosis not present

## 2023-10-12 DIAGNOSIS — M25552 Pain in left hip: Secondary | ICD-10-CM | POA: Diagnosis not present

## 2023-10-12 DIAGNOSIS — M25551 Pain in right hip: Secondary | ICD-10-CM

## 2023-10-12 DIAGNOSIS — M5459 Other low back pain: Secondary | ICD-10-CM | POA: Diagnosis not present

## 2023-10-12 NOTE — Therapy (Addendum)
OUTPATIENT PHYSICAL THERAPY THORACOLUMBAR TREATMENT Progress Note Reporting Period 08/15/23 to 10/12/23  See note below for Objective Data and Assessment of Progress/Goals.      Patient Name: Bethany Cole MRN: [id number] DOB:August 21, 1951, 72 y.o., female Today's Date: 10/12/2023  END OF SESSION:  PT End of Session - 10/12/23 1300     Visit Number 11    Number of Visits 16    Date for PT Re-Evaluation 11/07/23    Authorization Type [employer] auth required- auth approved 16 visits 08/15/2023 - 11/07/2023    Authorization - Visit Number 11    Authorization - Number of Visits 16    Progress Note Due on Visit 21    PT Start Time 1302    PT Stop Time 1340    PT Time Calculation (min) 38 min    Activity Tolerance Patient tolerated treatment well;No increased pain    Behavior During Therapy WFL for tasks assessed/performed               Past Medical History:  Diagnosis Date   Allergy    Anemia    Anxiety    Arthritis    Cataracts, bilateral    Gastritis    GERD (gastroesophageal reflux disease)    Heart murmur    Hypertension    Hyperthyroidism    OSA on CPAP    Sleep apnea    Squamous cell carcinoma of skin    Past Surgical History:  Procedure Laterality Date   ABDOMINAL HYSTERECTOMY     ENDOMETRIAL ABLATION     EYE SURGERY     HERNIA REPAIR     as an infant   UPPER GASTROINTESTINAL ENDOSCOPY  11/09/2021   Has had sevearal in [place]   WRIST ARTHROSCOPY Right    Patient Active Problem List   Diagnosis Date Noted   Insomnia 06/07/2023   Acute maxillary sinusitis 08/27/2022   Chronic pain of both feet 11/13/2021   Leg length discrepancy 11/13/2021   Vitamin B12 deficiency 09/02/2021   OSA on CPAP    Obesity hypoventilation syndrome (HCC)    Fall 11/17/2020   Hyperlipemia 11/07/2020   Osteoporosis 09/30/2020   Hyperthyroidism    Hypertension    GERD (gastroesophageal reflux disease)    Gastritis    Cataracts, bilateral     PCP: Philip Aspen,  Limmie Patricia, MD  REFERRING PROVIDER: Rodolph Bong, MD  REFERRING DIAG: M54.42,M54.41,G89.29 (ICD-10-CM) - Chronic bilateral low back pain with bilateral sciatica M25.551,M25.552 (ICD-10-CM) - Bilateral hip pain  Rationale for Evaluation and Treatment: Rehabilitation  THERAPY DIAG:  Bilateral hip pain  Muscle weakness (generalized)  Other low back pain  ONSET DATE: summer 2023  SUBJECTIVE:  SUBJECTIVE STATEMENT: 10/12/2023 Reports her hip has been bothering her the last couple of days. States she went shopping and about 1.5 hours into it, her hip started really bothering her. Patient arriving with visible limp. Reports today she feels 35% better but on days she feels 60% better.    Eval: Pain keeps changing. 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 Pain came on suddenly last year. States she also has stenosis and a cyst in her low back. State she has never been strong in her core. She feels strength will help with her pain. States she sometimes has arthritis pain and sometimes she has nerve pain. Previous pain was mostly in her right leg. States that now it is also in her left hip. Reports she recently received a left hip injection and that has helped. States she doesn't exercise much but she does walk but it hurts when she walks. She knows the move she holds herself up the less pain she has. Going up slight grades/inclines is challenging and painful.  States she has 2 dogs and they have to be walked separate and she needs to make sure they get their daily walks. Walk totals about an hour.  Some days she can't walk because she is either so exhausted or in too much pain.   Nerve pain can go around the leg into the groin on right but not recently.  Now leg pain is mostly in the lower left leg and not in the groin.     Reports that gardening is too difficult for her to do. Can't do anything where she has to pull or lift (bags of compost).   Has a niece that's 26 years old with CP and not walking - currently crawling - needs to be able to lift/assist her into chair - 60#    PERTINENT HISTORY:  HTN, anxiety, abdominal hysterectomy (hx of 5-6 abdominal/vaginal surgeries for endometriosis), hx right ankle fracture  PAIN:  Are you having pain? Yes: NPRS scale: 6/10 Pain location: R hip   Pain description: sore   Aggravating factors: unsure- maybe anything with hips flexed, sitting or sleeping position Relieving factors: walking, less sitting, activity, maybe HEP   PRECAUTIONS: None  RED FLAGS: None   WEIGHT BEARING RESTRICTIONS: No  FALLS:  Has patient fallen in last 6 months? No  LIVING ENVIRONMENT: Lives with: lifelong friend Lives in: House/apartment Stairs: No Has following equipment at home: Grab bars  OCCUPATION: retired, likes to walk, has 2 dogs that she likes to walk  PLOF: Independent, loves to garden   PATIENT GOALS: to be able to walk her dogs regularly and have stronger core    OBJECTIVE:  Note: Objective measures were completed at Evaluation unless otherwise noted.  DIAGNOSTIC FINDINGS:  08/10/23 hip xray - awaiting results  PATIENT SURVEYS:  FOTO 26%  12/18 36%, predicted 49%  SCREENING FOR RED FLAGS: Bowel or bladder incontinence: No Spinal tumors: No Cauda equina syndrome: No Compression fracture: No Abdominal aneurysm: No  COGNITION: Overall cognitive status: Within functional limits for tasks assessed     SENSATION: Not tested    POSTURE: rounded shoulders, forward head, increased thoracic kyphosis, posterior pelvic tilt, and flexed trunk   PALPATION: No tender spots observed on this date  LUMBAR ROM:   AROM 12/18  Flexion 75% limited*  Extension 90% limited*  Right lateral flexion 50% limited*  Left lateral flexion 75% limited* - favors  flexion  Right rotation   Left rotation    (Blank rows =  not tested)    LE Measurements Lower Extremity Right 12/18 Left 12/18   A/PROM MMT A/PROM MMT  Hip Flexion 95* 3+ 105 4-  Hip Extension      Hip Abduction      Hip Adduction      Hip Internal rotation (in supine) 20*  60   Hip External rotation (in supine) 20*  15   Knee Flexion  3*  3+*  Knee Extension  3+*  4  Ankle Dorsiflexion  3*  4-  Ankle Plantarflexion      Ankle Inversion      Ankle Eversion       (Blank rows = not tested) * pain   LUMBAR SPECIAL TESTS:  Straight leg raise test: + B and Slump test: + B  Breathing Assessment- belly breather- minimal diaphragmatica excursion noted with longer breathes  FUNCTIONAL TESTS:  Holds breath with all transitional movements    TODAY'S TREATMENT:                                                                                                                              DATE:   10/12/2023   Therapeutic Exercise:  Reviewed HEP, objective measurements updated, on pain management strategies Supine  Prone:    Seated: self mobilization with vibration gun and towel 15 minutes to right hip and R LE  Standing:   Neuromuscular Re-education:   Manual Therapy:  Therapeutic Activity:     Self Care: on elevation/lymph massage and how to help with swelling Trigger Point Dry Needling:  Modalities:    PATIENT EDUCATION:  Education details: on HEP, on objective measurements and FOTO score Person educated: Patient Education method: Explanation, Demonstration, and Handouts Education comprehension: verbalized understanding   HOME EXERCISE PROGRAM: Long exhale breathing, [id number]  ASSESSMENT:  CLINICAL IMPRESSION: 10/12/2023 Progress note performed on this date.  Overall patient was doing very well but has had a recent flareup over the weekend.  Educated patient in pain management strategies as well as edema management strategies his right leg seems to be more swollen  than left.  Patient has met with 1 short-term goals and is working towards long-term goals at this time.  Discussed following up with MD if symptoms get worse but allowing symptoms to work through their acute inflammatory process for the next week so long as symptoms do not worsen.  Will continue with current plan of care as tolerated by patient.    EVAL: Patient presents to physical therapy with chronic low back pain that refers into both legs inconsistently.  Patient with history of multiple abdominal/vaginal surgeries and poor tolerance to hip range of motion and overall interventions on this date.  Educated patient in rationale behind breathing mechanics to help promote core activation during the day.  Tolerated this exercise well and it was added to home exercise program.  Patient presents with limitations in range of motion, strength and posture that are likely contributing to current  presentation and would greatly benefit from skilled PT to address physical impairments and improve overall function and quality of life.  OBJECTIVE IMPAIRMENTS: decreased activity tolerance, difficulty walking, decreased ROM, decreased strength, improper body mechanics, postural dysfunction, and pain.   ACTIVITY LIMITATIONS: carrying, lifting, bending, standing, squatting, transfers, bed mobility, and locomotion level  PARTICIPATION LIMITATIONS: cleaning, community activity, yard work, and walking dogs  PERSONAL FACTORS: Age, Fitness, Time since onset of injury/illness/exacerbation, and 1 comorbidity: multiple abdominal surgeries  are also affecting patient's functional outcome.   REHAB POTENTIAL: Good  CLINICAL DECISION MAKING: Stable/uncomplicated  EVALUATION COMPLEXITY: Low   GOALS: Goals reviewed with patient? yes  SHORT TERM GOALS: Target date: 09/26/2023  Patient will be independent in self management strategies to improve quality of life and functional outcomes. Baseline: New Program Goal status:  PROGRESSING  2.  Patient will report at least 50% improvement in overall symptoms and/or function to demonstrate improved functional mobility Baseline: 0% better Goal status: PROGRESSING  3.  Patient will be able to regularly walk both dogs without having to limit herself due to pain to return to prior level of function Baseline: Unable Goal status: MET  4.  Patient will demonstrate pain-free lower extremity manual muscle testing Baseline: Painful Goal status: PROGRESSING    LONG TERM GOALS: Target date: 11/07/2023   Patient will report at least 75% improvement in overall symptoms and/or function to demonstrate improved functional mobility Baseline: 0% better Goal status: PROGRESSING  2.  Patient will improve score on FOTO outcomes measure to projected score to demonstrate overall improved function and QOL Baseline: see above Goal status: PROGRESSING  3.  Patient will be able to demonstrate breathing mechanics with use of obliques to improve core activation during regular tasks Baseline: Unable Goal status: PROGRESSING  4.  Patient will be able to demonstrate pain-free and lumbar range of motion in all directions. Baseline: Painful Goal status: PROGRESSING   PLAN:  PT FREQUENCY: 1-2x/week for a total of 16 visits  PT DURATION: 12 weeks  PLANNED INTERVENTIONS: 97110-Therapeutic exercises, 97530- Therapeutic activity, [id number]- Neuromuscular re-education, 97535- Self Care, 81191- Manual therapy, [phone]- Gait training, [phone]- Orthotic Fit/training, [phone]- Canalith repositioning, [id number]- Aquatic Therapy, 97014- Electrical stimulation (unattended), [phone]- Ionotophoresis 4mg /ml Dexamethasone, Patient/Family education, Balance training, Stair training, Taping, Dry Needling, Joint mobilization, Joint manipulation, Spinal manipulation, Spinal mobilization, Cryotherapy, and Moist heat   PLAN FOR NEXT SESSION: PN-check with patient to see if patient can change from the last visit to  January 13 to stay within POC  :wants to be have clear idea of her feel good exercises and her maintainence  exercises.  review sit bones-percussion gun, SIJ, breathing exercises  score activation with breath work- progress breathing, add in UE for lifting and improved lifting 73 year old niece. Continue progressing hip strength    1:49 PM, 10/12/23 Tereasa Coop, DPT Physical Therapy with [facility]

## 2023-10-13 DIAGNOSIS — H53002 Unspecified amblyopia, left eye: Secondary | ICD-10-CM | POA: Diagnosis not present

## 2023-10-13 DIAGNOSIS — H35412 Lattice degeneration of retina, left eye: Secondary | ICD-10-CM | POA: Diagnosis not present

## 2023-10-13 DIAGNOSIS — H35361 Drusen (degenerative) of macula, right eye: Secondary | ICD-10-CM | POA: Diagnosis not present

## 2023-10-13 DIAGNOSIS — H43811 Vitreous degeneration, right eye: Secondary | ICD-10-CM | POA: Diagnosis not present

## 2023-10-13 DIAGNOSIS — H442A2 Degenerative myopia with choroidal neovascularization, left eye: Secondary | ICD-10-CM | POA: Diagnosis not present

## 2023-10-13 DIAGNOSIS — H35371 Puckering of macula, right eye: Secondary | ICD-10-CM | POA: Diagnosis not present

## 2023-10-13 DIAGNOSIS — H15832 Staphyloma posticum, left eye: Secondary | ICD-10-CM | POA: Diagnosis not present

## 2023-10-17 ENCOUNTER — Ambulatory Visit: Payer: Medicare Other

## 2023-10-20 ENCOUNTER — Encounter: Payer: Medicare Other | Admitting: Physical Therapy

## 2023-10-21 ENCOUNTER — Encounter: Payer: Self-pay | Admitting: Internal Medicine

## 2023-10-21 ENCOUNTER — Ambulatory Visit: Payer: Medicare Other

## 2023-10-21 NOTE — Assessment & Plan Note (Signed)
Can continue Sonata. Discussed increasing to 10 mg/ night if needed, or changing to temazepam 15 mg. Watch effect on naps.

## 2023-10-21 NOTE — Assessment & Plan Note (Signed)
Benefits from CPAP with good compliance and control Plan- continue auto 5-15 

## 2023-10-24 ENCOUNTER — Ambulatory Visit: Payer: Medicare Other

## 2023-10-24 NOTE — Telephone Encounter (Signed)
N/S for NUR visit 10/24/23.

## 2023-10-24 NOTE — Telephone Encounter (Signed)
Osteoporosis is now being managed by Dr. Clementeen Graham.

## 2023-10-25 ENCOUNTER — Telehealth: Payer: Self-pay | Admitting: Physical Therapy

## 2023-10-25 ENCOUNTER — Encounter: Payer: Medicare Other | Admitting: Physical Therapy

## 2023-10-25 NOTE — Telephone Encounter (Signed)
 Called patient about missed apt and patient confused about apts and did not have them in her calendar. Confirmed future apts with patient. Patient also stating she has been having increased pain and unable to walk. Instructed patient to call Dr. Virgilio office to schedule an apt with them regarding recent increase in hip pain.   9:53 AM, 10/25/23 Bethany Cole, DPT Physical Therapy with [facility]

## 2023-10-27 NOTE — Telephone Encounter (Signed)
 VOB initiated for 2025.

## 2023-11-01 NOTE — Telephone Encounter (Signed)
 [facility] and [facility] St[facility] - Prior Authorization REQUIRED  [facility]

## 2023-11-02 ENCOUNTER — Ambulatory Visit: Payer: Medicare Other

## 2023-11-02 ENCOUNTER — Ambulatory Visit: Payer: Medicare Other | Admitting: Physical Therapy

## 2023-11-02 ENCOUNTER — Encounter: Payer: Self-pay | Admitting: Physical Therapy

## 2023-11-02 DIAGNOSIS — G8929 Other chronic pain: Secondary | ICD-10-CM

## 2023-11-02 DIAGNOSIS — M5459 Other low back pain: Secondary | ICD-10-CM | POA: Diagnosis not present

## 2023-11-02 DIAGNOSIS — M545 Low back pain, unspecified: Secondary | ICD-10-CM

## 2023-11-02 DIAGNOSIS — M25552 Pain in left hip: Secondary | ICD-10-CM | POA: Diagnosis not present

## 2023-11-02 DIAGNOSIS — M810 Age-related osteoporosis without current pathological fracture: Secondary | ICD-10-CM | POA: Diagnosis not present

## 2023-11-02 DIAGNOSIS — M6281 Muscle weakness (generalized): Secondary | ICD-10-CM

## 2023-11-02 DIAGNOSIS — M25551 Pain in right hip: Secondary | ICD-10-CM | POA: Diagnosis not present

## 2023-11-02 MED ORDER — DENOSUMAB 60 MG/ML ~~LOC~~ SOSY
60.0000 mg | PREFILLED_SYRINGE | Freq: Once | SUBCUTANEOUS | Status: AC
Start: 2023-11-16 — End: 2023-11-02
  Administered 2023-11-02: 60 mg via SUBCUTANEOUS

## 2023-11-02 NOTE — Telephone Encounter (Signed)
 Patient is ready for scheduling on or after 10/26/23 BUY AND BILL  Out-of-pocket cost due at time of visit: $353.52  Primary: [employer] Medicare Adv [employer]  co-insurance: 20% (approximately $323.52) Admin fee co-insurance: $30  Deductible: does not apply  Prior Auth: APPROVED PA# [id number] Valid: 10/03/23-10/02/24  Secondary: N/A Prolia  co-insurance:  Admin fee co-insurance:  Deductible:  Prior Auth:  PA# Valid:   ** This summary of benefits is an estimation of the patient's out-of-pocket cost. Exact cost may vary based on individual plan coverage.

## 2023-11-02 NOTE — Therapy (Signed)
 OUTPATIENT PHYSICAL THERAPY THORACOLUMBAR TREATMENT      Patient Name: Bethany Cole MRN: [id number] DOB:May 10, 1951, 73 y.o., female Today's Date: 11/02/2023  END OF SESSION:  PT End of Session - 11/02/23 0930     Visit Number 12    Number of Visits 16    Date for PT Re-Evaluation 11/07/23    Authorization Type U[employer] auth required- auth approved 16 visits 08/15/2023 - 11/07/2023    Authorization - Visit Number 12    Authorization - Number of Visits 16    Progress Note Due on Visit 21    PT Start Time 0932    PT Stop Time 1012    PT Time Calculation (min) 40 min    Activity Tolerance Patient tolerated treatment well;No increased pain    Behavior During Therapy WFL for tasks assessed/performed               Past Medical History:  Diagnosis Date   Allergy    Anemia    Anxiety    Arthritis    Cataracts, bilateral    Gastritis    GERD (gastroesophageal reflux disease)    Heart murmur    Hypertension    Hyperthyroidism    OSA on CPAP    Sleep apnea    Squamous cell carcinoma of skin    Past Surgical History:  Procedure Laterality Date   ABDOMINAL HYSTERECTOMY     ENDOMETRIAL ABLATION     EYE SURGERY     HERNIA REPAIR     as an infant   UPPER GASTROINTESTINAL ENDOSCOPY  11/09/2021   Has had sevearal in [place]   WRIST ARTHROSCOPY Right    Patient Active Problem List   Diagnosis Date Noted   Insomnia 06/07/2023   Acute maxillary sinusitis 08/27/2022   Chronic pain of both feet 11/13/2021   Leg length discrepancy 11/13/2021   Vitamin B12 deficiency 09/02/2021   OSA on CPAP    Obesity hypoventilation syndrome (HCC)    Fall 11/17/2020   Hyperlipemia 11/07/2020   Osteoporosis 09/30/2020   Hyperthyroidism    Hypertension    GERD (gastroesophageal reflux disease)    Gastritis    Cataracts, bilateral     PCP: Theophilus Andrews, Tully GRADE, MD  REFERRING PROVIDER: Joane Artist RAMAN, MD  REFERRING DIAG: M54.42,M54.41,G89.29 (ICD-10-CM) - Chronic bilateral low  back pain with bilateral sciatica M25.551,M25.552 (ICD-10-CM) - Bilateral hip pain  Rationale for Evaluation and Treatment: Rehabilitation  THERAPY DIAG:  Bilateral hip pain  Muscle weakness (generalized)  Other low back pain  Chronic midline low back pain without sciatica  ONSET DATE: summer 2023  SUBJECTIVE:  SUBJECTIVE STATEMENT: 11/02/2023 States that her hip is still bothering her. It is not as bad as it was over Christmas. States she can't figure out if not doing something or doing her exercises is good for her hip. States that her hip hurts different ways on different days and needs help figuring out what helps on what days. States using her massage gun and when she uses it consistently she feels better.  Pain varies from: ball/mass that is tight in her hip and isn't moving with everything else  and it hurts all the time side stepping helps, OR it hurts down the entire front and side of her thigh, OR her back feels really stiff.   Eval: Pain keeps changing. 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 Pain came on suddenly last year. States she also has stenosis and a cyst in her low back. State she has never been strong in her core. She feels strength will help with her pain. States she sometimes has arthritis pain and sometimes she has nerve pain. Previous pain was mostly in her right leg. States that now it is also in her left hip. Reports she recently received a left hip injection and that has helped. States she doesn't exercise much but she does walk but it hurts when she walks. She knows the move she holds herself up the less pain she has. Going up slight grades/inclines is challenging and painful.  States she has 2 dogs and they have to be walked separate and she needs to make sure they get their daily  walks. Walk totals about an hour.  Some days she can't walk because she is either so exhausted or in too much pain.   Nerve pain can go around the leg into the groin on right but not recently.  Now leg pain is mostly in the lower left leg and not in the groin.    Reports that gardening is too difficult for her to do. Can't do anything where she has to pull or lift (bags of compost).   Has a niece that's 66 years old with CP and not walking - currently crawling - needs to be able to lift/assist her into chair - 60#    PERTINENT HISTORY:  HTN, anxiety, abdominal hysterectomy (hx of 5-6 abdominal/vaginal surgeries for endometriosis), hx right ankle fracture  PAIN:  Are you having pain? Yes: NPRS scale: 6/10 Pain location: R hip   Pain description: sore   Aggravating factors: unsure- maybe anything with hips flexed, sitting or sleeping position Relieving factors: walking, less sitting, activity, maybe HEP   PRECAUTIONS: None  RED FLAGS: None   WEIGHT BEARING RESTRICTIONS: No  FALLS:  Has patient fallen in last 6 months? No  LIVING ENVIRONMENT: Lives with: lifelong friend Lives in: House/apartment Stairs: No Has following equipment at home: Grab bars  OCCUPATION: retired, likes to walk, has 2 dogs that she likes to walk  PLOF: Independent, loves to garden   PATIENT GOALS: to be able to walk her dogs regularly and have stronger core    OBJECTIVE:  Note: Objective measures were completed at Evaluation unless otherwise noted.  DIAGNOSTIC FINDINGS:  08/10/23 hip xray - awaiting results  PATIENT SURVEYS:  FOTO 26%  12/18 36%, predicted 49%  SCREENING FOR RED FLAGS: Bowel or bladder incontinence: No Spinal tumors: No Cauda equina syndrome: No Compression fracture: No Abdominal aneurysm: No  COGNITION: Overall cognitive status: Within functional limits for tasks assessed     SENSATION: Not tested  POSTURE: rounded shoulders, forward head, increased thoracic  kyphosis, posterior pelvic tilt, and flexed trunk   PALPATION: No tender spots observed on this date  LUMBAR ROM:   AROM 12/18  Flexion 75% limited*  Extension 90% limited*  Right lateral flexion 50% limited*  Left lateral flexion 75% limited* - favors flexion  Right rotation   Left rotation    (Blank rows = not tested)    LE Measurements Lower Extremity Right 12/18 Left 12/18   A/PROM MMT A/PROM MMT  Hip Flexion 95* 3+ 105 4-  Hip Extension      Hip Abduction      Hip Adduction      Hip Internal rotation (in supine) 20*  60   Hip External rotation (in supine) 20*  15   Knee Flexion  3*  3+*  Knee Extension  3+*  4  Ankle Dorsiflexion  3*  4-  Ankle Plantarflexion      Ankle Inversion      Ankle Eversion       (Blank rows = not tested) * pain   LUMBAR SPECIAL TESTS:  Straight leg raise test: + B and Slump test: + B  Breathing Assessment- belly breather- minimal diaphragmatica excursion noted with longer breathes  FUNCTIONAL TESTS:  Holds breath with all transitional movements    TODAY'S TREATMENT:                                                                                                                              DATE:   11/02/2023   Therapeutic Exercise: Supine  Prone:    Seated: reviewed entirely exercise program with rational of when/how and what to perform depending on pain presentation, listening to her body, performing first thing in AM and after sitting for long periods of time -30 minutes  Standing:   Neuromuscular Re-education:   Manual Therapy:  Gait Training:-education on how/why/when to use cane- practiced walking in clinic- educated in how to size for her height - 10 minutes  Therapeutic Activity:     Self Care:  Trigger Point Dry Needling:  Modalities:    PATIENT EDUCATION:  Education details: on HEP, see above in treatment section Person educated: Patient Education method: Explanation, Demonstration, and Handouts Education  comprehension: verbalized understanding   HOME EXERCISE PROGRAM: Long exhale breathing, [id number]  ASSESSMENT:  CLINICAL IMPRESSION: 11/02/2023 Reviewed entire HEP and answered all questions. Discussed different types of pain she presents with and what specific exercise should help with this. Printed off program and wrote notes with these instructions to improve patient recall/adherence to suggestions. Educated and reminded patient how cane could help with pain, strengthening and healing. Reassured patient and encouraged patient to f/u with MD about increased hip pain. Will continue with current POC as tolerated.     EVAL: Patient presents to physical therapy with chronic low back pain that refers into both legs inconsistently.  Patient with history of multiple abdominal/vaginal surgeries and poor  tolerance to hip range of motion and overall interventions on this date.  Educated patient in rationale behind breathing mechanics to help promote core activation during the day.  Tolerated this exercise well and it was added to home exercise program.  Patient presents with limitations in range of motion, strength and posture that are likely contributing to current presentation and would greatly benefit from skilled PT to address physical impairments and improve overall function and quality of life.  OBJECTIVE IMPAIRMENTS: decreased activity tolerance, difficulty walking, decreased ROM, decreased strength, improper body mechanics, postural dysfunction, and pain.   ACTIVITY LIMITATIONS: carrying, lifting, bending, standing, squatting, transfers, bed mobility, and locomotion level  PARTICIPATION LIMITATIONS: cleaning, community activity, yard work, and walking dogs  PERSONAL FACTORS: Age, Fitness, Time since onset of injury/illness/exacerbation, and 1 comorbidity: multiple abdominal surgeries  are also affecting patient's functional outcome.   REHAB POTENTIAL: Good  CLINICAL DECISION MAKING:  Stable/uncomplicated  EVALUATION COMPLEXITY: Low   GOALS: Goals reviewed with patient? yes  SHORT TERM GOALS: Target date: 09/26/2023  Patient will be independent in self management strategies to improve quality of life and functional outcomes. Baseline: New Program Goal status: PROGRESSING  2.  Patient will report at least 50% improvement in overall symptoms and/or function to demonstrate improved functional mobility Baseline: 0% better Goal status: PROGRESSING  3.  Patient will be able to regularly walk both dogs without having to limit herself due to pain to return to prior level of function Baseline: Unable Goal status: MET  4.  Patient will demonstrate pain-free lower extremity manual muscle testing Baseline: Painful Goal status: PROGRESSING    LONG TERM GOALS: Target date: 11/07/2023   Patient will report at least 75% improvement in overall symptoms and/or function to demonstrate improved functional mobility Baseline: 0% better Goal status: PROGRESSING  2.  Patient will improve score on FOTO outcomes measure to projected score to demonstrate overall improved function and QOL Baseline: see above Goal status: PROGRESSING  3.  Patient will be able to demonstrate breathing mechanics with use of obliques to improve core activation during regular tasks Baseline: Unable Goal status: PROGRESSING  4.  Patient will be able to demonstrate pain-free and lumbar range of motion in all directions. Baseline: Painful Goal status: PROGRESSING   PLAN:  PT FREQUENCY: 1-2x/week for a total of 16 visits  PT DURATION: 12 weeks  PLANNED INTERVENTIONS: 97110-Therapeutic exercises, 97530- Therapeutic activity, [id number]- Neuromuscular re-education, 97535- Self Care, 02859- Manual therapy, [id number]- Gait training, [phone]- Orthotic Fit/training, [phone]- Canalith repositioning, [id number]- Aquatic Therapy, [id number]- Electrical stimulation (unattended), [phone]- Ionotophoresis 4mg /ml Dexamethasone,  Patient/Family education, Balance training, Stair training, Taping, Dry Needling, Joint mobilization, Joint manipulation, Spinal manipulation, Spinal mobilization, Cryotherapy, and Moist heat   PLAN FOR NEXT SESSION:  :wants to be have clear idea of her feel good exercises and her maintainence  exercises.  review sit bones-percussion gun, SIJ, breathing exercises  score activation with breath work- progress breathing, add in UE for lifting and improved lifting 33 year old niece. Continue progressing hip strength    10:26 AM, 11/02/23 Olivia Church, DPT Physical Therapy with [facility]

## 2023-11-02 NOTE — Telephone Encounter (Signed)
 APPROVED PA# [id number] Valid: 10/03/23-10/02/24

## 2023-11-02 NOTE — Progress Notes (Signed)
 Medical Bethany Cole and Bethany Cole  APPROVED PA# [id number] Valid: 10/03/23-10/02/24   Pt received Prolia inj, SQ, left upper arm. Pt tolerated well. Repeat injection in 6 months.

## 2023-11-04 NOTE — Telephone Encounter (Signed)
 Last Prolia inj 11/02/23 Next Prolia inj due 05/02/24

## 2023-11-07 ENCOUNTER — Encounter: Payer: Self-pay | Admitting: Physical Therapy

## 2023-11-07 ENCOUNTER — Ambulatory Visit: Payer: Medicare Other | Admitting: Physical Therapy

## 2023-11-07 DIAGNOSIS — G8929 Other chronic pain: Secondary | ICD-10-CM

## 2023-11-07 DIAGNOSIS — M545 Low back pain, unspecified: Secondary | ICD-10-CM | POA: Diagnosis not present

## 2023-11-07 DIAGNOSIS — M6281 Muscle weakness (generalized): Secondary | ICD-10-CM | POA: Diagnosis not present

## 2023-11-07 DIAGNOSIS — M25551 Pain in right hip: Secondary | ICD-10-CM

## 2023-11-07 DIAGNOSIS — M25552 Pain in left hip: Secondary | ICD-10-CM | POA: Diagnosis not present

## 2023-11-07 DIAGNOSIS — M5459 Other low back pain: Secondary | ICD-10-CM

## 2023-11-07 NOTE — Therapy (Signed)
 OUTPATIENT PHYSICAL THERAPY THORACOLUMBAR TREATMENT  PHYSICAL THERAPY DISCHARGE SUMMARY  Visits from Start of Care: 13  Current functional level related to goals / functional outcomes: See below   Remaining deficits: See below     Education / Equipment: See below   Patient agrees to discharge. Patient goals were partially met. Patient is being discharged due to  need MD f/u, plateau in progress.     Patient Name: Bethany Cole MRN: [id number] DOB:[birth year], 73 y.o., female Today's Date: 11/07/2023  END OF SESSION:  PT End of Session - 11/07/23 0931     Visit Number 13    Number of Visits 16    Date for PT Re-Evaluation 11/07/23    Authorization Type UHC auth required- auth approved 16 visits 08/15/2023 - 11/07/2023    Authorization - Visit Number 13    Authorization - Number of Visits 16    Progress Note Due on Visit 21    PT Start Time 0933    PT Stop Time 1011    PT Time Calculation (min) 38 min    Activity Tolerance Patient tolerated treatment well;No increased pain    Behavior During Therapy WFL for tasks assessed/performed               Past Medical History:  Diagnosis Date   Allergy    Anemia    Anxiety    Arthritis    Cataracts, bilateral    Gastritis    GERD (gastroesophageal reflux disease)    Heart murmur    Hypertension    Hyperthyroidism    OSA on CPAP    Sleep apnea    Squamous cell carcinoma of skin    Past Surgical History:  Procedure Laterality Date   ABDOMINAL HYSTERECTOMY     ENDOMETRIAL ABLATION     EYE SURGERY     HERNIA REPAIR     as an infant   UPPER GASTROINTESTINAL ENDOSCOPY  11/09/2021   Has had sevearal in [place]   WRIST ARTHROSCOPY Right    Patient Active Problem List   Diagnosis Date Noted   Insomnia 06/07/2023   Acute maxillary sinusitis 08/27/2022   Chronic pain of both feet 11/13/2021   Leg length discrepancy 11/13/2021   Vitamin B12 deficiency 09/02/2021   OSA on CPAP    Obesity hypoventilation  syndrome (HCC)    Fall 11/17/2020   Hyperlipemia 11/07/2020   Osteoporosis 09/30/2020   Hyperthyroidism    Hypertension    GERD (gastroesophageal reflux disease)    Gastritis    Cataracts, bilateral     PCP: Theophilus Andrews, Tully GRADE, MD  REFERRING PROVIDER: Joane Artist RAMAN, MD  REFERRING DIAG: M54.42,M54.41,G89.29 (ICD-10-CM) - Chronic bilateral low back pain with bilateral sciatica M25.551,M25.552 (ICD-10-CM) - Bilateral hip pain  Rationale for Evaluation and Treatment: Rehabilitation  THERAPY DIAG:  Bilateral hip pain  Muscle weakness (generalized)  Other low back pain  Chronic midline low back pain without sciatica  ONSET DATE: summer 2023  SUBJECTIVE:  SUBJECTIVE STATEMENT: 11/07/2023  States she is trying to focus on her exercises but realizes she was a little behind on her exercises. States that the LTR she continues to have to be careful with that one. States she expects to be able to go further. States sh hasn't had the intense pain that she had a few weeks ago but it still hurts a lot. States she did buy a cane and is using it at home. States she has to get the hang of it a little bit more. Reports she feels 0-30% better.   Eval: Pain keeps changing. 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 Pain came on suddenly last year. States she also has stenosis and a cyst in her low back. State she has never been strong in her core. She feels strength will help with her pain. States she sometimes has arthritis pain and sometimes she has nerve pain. Previous pain was mostly in her right leg. States that now it is also in her left hip. Reports she recently received a left hip injection and that has helped. States she doesn't exercise much but she does walk but it hurts when she walks. She knows the move she  holds herself up the less pain she has. Going up slight grades/inclines is challenging and painful.  States she has 2 dogs and they have to be walked separate and she needs to make sure they get their daily walks. Walk totals about an hour.  Some days she can't walk because she is either so exhausted or in too much pain.   Nerve pain can go around the leg into the groin on right but not recently.  Now leg pain is mostly in the lower left leg and not in the groin.    Reports that gardening is too difficult for her to do. Can't do anything where she has to pull or lift (bags of compost).   Has a niece that's 63 years old with CP and not walking - currently crawling - needs to be able to lift/assist her into chair - 60#    PERTINENT HISTORY:  HTN, anxiety, abdominal hysterectomy (hx of 5-6 abdominal/vaginal surgeries for endometriosis), hx right ankle fracture  PAIN:  Are you having pain? Yes: NPRS scale: 6/10 Pain location: R hip   Pain description: sore   Aggravating factors: unsure- maybe anything with hips flexed, sitting or sleeping position Relieving factors: walking, less sitting, activity, maybe HEP   PRECAUTIONS: None  RED FLAGS: None   WEIGHT BEARING RESTRICTIONS: No  FALLS:  Has patient fallen in last 6 months? No  LIVING ENVIRONMENT: Lives with: lifelong friend Lives in: House/apartment Stairs: No Has following equipment at home: Grab bars  OCCUPATION: retired, likes to walk, has 2 dogs that she likes to walk  PLOF: Independent, loves to garden   PATIENT GOALS: to be able to walk her dogs regularly and have stronger core    OBJECTIVE:  Note: Objective measures were completed at Evaluation unless otherwise noted.  DIAGNOSTIC FINDINGS:  08/10/23 hip xray - awaiting results  PATIENT SURVEYS:  FOTO 26%  12/18 36%, predicted 49%  SCREENING FOR RED FLAGS: Bowel or bladder incontinence: No Spinal tumors: No Cauda equina syndrome: No Compression fracture:  No Abdominal aneurysm: No  COGNITION: Overall cognitive status: Within functional limits for tasks assessed     SENSATION: Not tested    POSTURE: rounded shoulders, forward head, increased thoracic kyphosis, posterior pelvic tilt, and flexed trunk   PALPATION: No tender spots observed  on this date  LUMBAR ROM:   AROM 1/13  Flexion 50% limited  Extension 90% limited*  Right lateral flexion 50% limited*  Left lateral flexion 50% limited* - favors flexion  Right rotation   Left rotation    (Blank rows = not tested)    LE Measurements Lower Extremity Right 1/13 Left 1/13   A/PROM MMT A/PROM MMT  Hip Flexion 110* 3+ 115 4-  Hip Extension      Hip Abduction      Hip Adduction      Hip Internal rotation (in supine) 45*  60   Hip External rotation (in supine) 45*  15*   Knee Flexion  3*  3  Knee Extension  3+*  4  Ankle Dorsiflexion  3*  4  Ankle Plantarflexion      Ankle Inversion      Ankle Eversion       (Blank rows = not tested) * pain   LUMBAR SPECIAL TESTS:  Straight leg raise test: + B and Slump test: + B  Breathing Assessment- belly breather- minimal diaphragmatica excursion noted with longer breathes  FUNCTIONAL TESTS:  11/07/23 No longer holding breath with all transitional movements    TODAY'S TREATMENT:                                                                                                                              DATE:   11/07/2023   Therapeutic Exercise: Objective measures updated Answered all questions, discussed HEP, return to MD and possible benefits of different types of pain managements interventions. About turning painful motions into exercises once ready with proper warm up Prone:    Seated:   Standing:   Neuromuscular Re-education:   Manual Therapy:  Gait Training:-education on how/why/when to use cane- practiced walking in clinic- - 8 minutes  Therapeutic Activity:     Self Care:  Trigger Point Dry Needling:   Modalities:    PATIENT EDUCATION:  Education details: on HEP, see above in treatment section Person educated: Patient Education method: Explanation, Demonstration, and Handouts Education comprehension: verbalized understanding   HOME EXERCISE PROGRAM: Long exhale breathing, [id number]  ASSESSMENT:  CLINICAL IMPRESSION: 11/07/2023 Overall left hip is doing great but right hip is still having increased pain recently. Answered al questions and discussed MD f/u to peruse alternative pain managements interventions that MD sees as appropriate. Reviewed entire HEP and answered all questions. Discussed rehab and plan moving forward. Answered all questions and patient to discharge from PT to HEP secondary to plateau in progress and independence in HEP.     EVAL: Patient presents to physical therapy with chronic low back pain that refers into both legs inconsistently.  Patient with history of multiple abdominal/vaginal surgeries and poor tolerance to hip range of motion and overall interventions on this date.  Educated patient in rationale behind breathing mechanics to help promote core activation during the day.  Tolerated this exercise well and it  was added to home exercise program.  Patient presents with limitations in range of motion, strength and posture that are likely contributing to current presentation and would greatly benefit from skilled PT to address physical impairments and improve overall function and quality of life.  OBJECTIVE IMPAIRMENTS: decreased activity tolerance, difficulty walking, decreased ROM, decreased strength, improper body mechanics, postural dysfunction, and pain.   ACTIVITY LIMITATIONS: carrying, lifting, bending, standing, squatting, transfers, bed mobility, and locomotion level  PARTICIPATION LIMITATIONS: cleaning, community activity, yard work, and walking dogs  PERSONAL FACTORS: Age, Fitness, Time since onset of injury/illness/exacerbation, and 1 comorbidity:  multiple abdominal surgeries  are also affecting patient's functional outcome.   REHAB POTENTIAL: Good  CLINICAL DECISION MAKING: Stable/uncomplicated  EVALUATION COMPLEXITY: Low   GOALS: Goals reviewed with patient? yes  SHORT TERM GOALS: Target date: 09/26/2023  Patient will be independent in self management strategies to improve quality of life and functional outcomes. Baseline: New Program Goal status: PROGRESSING  2.  Patient will report at least 50% improvement in overall symptoms and/or function to demonstrate improved functional mobility Baseline: 0% better Goal status: PROGRESSING  3.  Patient will be able to regularly walk both dogs without having to limit herself due to pain to return to prior level of function Baseline: Unable Goal status: MET  4.  Patient will demonstrate pain-free lower extremity manual muscle testing Baseline: Painful Goal status: PROGRESSING    LONG TERM GOALS: Target date: 11/07/2023   Patient will report at least 75% improvement in overall symptoms and/or function to demonstrate improved functional mobility Baseline: 0% better Goal status: PROGRESSING  2.  Patient will improve score on FOTO outcomes measure to projected score to demonstrate overall improved function and QOL Baseline: see above Goal status: PROGRESSING  3.  Patient will be able to demonstrate breathing mechanics with use of obliques to improve core activation during regular tasks Baseline: Unable Goal status: PROGRESSING  4.  Patient will be able to demonstrate pain-free and lumbar range of motion in all directions. Baseline: Painful Goal status: PROGRESSING   PLAN:  PT FREQUENCY: 1-2x/week for a total of 16 visits  PT DURATION: 12 weeks  PLANNED INTERVENTIONS: 97110-Therapeutic exercises, 97530- Therapeutic activity, [id number]- Neuromuscular re-education, 97535- Self Care, 02859- Manual therapy, [phone]- Gait training, [phone]- Orthotic Fit/training, [phone]- Canalith  repositioning, [id number]- Aquatic Therapy, 97014- Electrical stimulation (unattended), [phone]- Ionotophoresis 4mg /ml Dexamethasone, Patient/Family education, Balance training, Stair training, Taping, Dry Needling, Joint mobilization, Joint manipulation, Spinal manipulation, Spinal mobilization, Cryotherapy, and Moist heat   PLAN FOR NEXT SESSION:  DC to HEP   11:18 AM, 11/07/23 Olivia Church, DPT Physical Therapy with [facility]

## 2023-11-09 ENCOUNTER — Encounter: Payer: Medicare Other | Admitting: Physical Therapy

## 2023-11-11 ENCOUNTER — Ambulatory Visit (INDEPENDENT_AMBULATORY_CARE_PROVIDER_SITE_OTHER): Payer: Medicare Other

## 2023-11-11 DIAGNOSIS — E538 Deficiency of other specified B group vitamins: Secondary | ICD-10-CM

## 2023-11-11 MED ORDER — CYANOCOBALAMIN 1000 MCG/ML IJ SOLN
1000.0000 ug | Freq: Once | INTRAMUSCULAR | Status: AC
Start: 2023-11-11 — End: 2023-11-11
  Administered 2023-11-11: 1000 ug via INTRAMUSCULAR

## 2023-11-11 NOTE — Progress Notes (Signed)
Per orders of Dr. Casimiro Needle, injection of B12 given by Vickii Chafe on Left Deltoid. Patient tolerated injection well.

## 2023-11-14 ENCOUNTER — Ambulatory Visit: Payer: Medicare Other | Admitting: Family Medicine

## 2023-11-14 NOTE — Progress Notes (Deleted)
   Rubin Payor, PhD, LAT, ATC acting as a scribe for Clementeen Graham, MD.  Bethany Cole is a 73 y.o. female who presents to [facility] today for cont'd bilat hip pain. Pt was last seen by Dr. Denyse Amass on 09/26/23 and we re-authorized PT, completing 13 total visits.   Today, pt reports ***  Dx testing: 08/10/23 L hip XR 08/10/23 Labs 04/26/23 L-spine MRI 04/11/23 DEXA scan             10/24/23 L-spine XR  05/03/21 R hip MRI  04/29/21 R hip XR  Pertinent review of systems: ***  Relevant historical information: ***   Exam:  There were no vitals taken for this visit. General: Well Developed, well nourished, and in no acute distress.   MSK: ***    Lab and Radiology Results No results found for this or any previous visit (from the past 72 hours). No results found.     Assessment and Plan: 73 y.o. female with ***   PDMP not reviewed this encounter. No orders of the defined types were placed in this encounter.  No orders of the defined types were placed in this encounter.    Discussed warning signs or symptoms. Please see discharge instructions. Patient expresses understanding.   ***

## 2023-11-28 ENCOUNTER — Encounter: Payer: Self-pay | Admitting: Internal Medicine

## 2023-11-30 ENCOUNTER — Encounter: Payer: Self-pay | Admitting: Internal Medicine

## 2023-11-30 ENCOUNTER — Ambulatory Visit: Payer: Medicare Other | Admitting: Internal Medicine

## 2023-11-30 VITALS — BP 120/74 | HR 64 | Ht 63.5 in | Wt 158.0 lb

## 2023-11-30 DIAGNOSIS — E0590 Thyrotoxicosis, unspecified without thyrotoxic crisis or storm: Secondary | ICD-10-CM

## 2023-11-30 DIAGNOSIS — E0500 Thyrotoxicosis with diffuse goiter without thyrotoxic crisis or storm: Secondary | ICD-10-CM

## 2023-11-30 NOTE — Progress Notes (Signed)
 Name: Bethany Cole  MRN/ DOB: [id number], Sep 24, 1951    Age/ Sex: 73 y.o., female     PCP: Theophilus Andrews, Tully GRADE, MD   Reason for Endocrinology Evaluation: Hyperthyroidism     Initial Endocrinology Clinic Visit: 12/03/2020    PATIENT IDENTIFIER: Bethany Cole is a 73 y.o., female with a past medical history of HTN, GERD and Hyperthyroidism. She has followed with [facility] since 12/03/2020 for consultative assistance with management of her Hyperthyroidism.    Moved from [place]  04/2020  HISTORICAL SUMMARY:  She has been diagnosed with hyperthyroidism in 11/2019 during routine work up with a TSH of 0.15 uIU/L , FT4 1.5 ng/dL ( 9.2-8.4 ng/dL) and an elevated T3 5.69 pg/mL ( 1.7-3.7) . She had worsening anxiety and tremors at the time , this was attributed to Grave's Disease  On her initial visit at our clinic she was on methimazole  2.5 mg daily    No FH of thyroid  disease    SUBJECTIVE:     Today (11/30/2023):  [name] is here for hyperthyroidism.   Denies palpitation  Denies diarrhea but has occasional chronic  constipation  Denies local neck symptoms  Fatigue has improved, with improvement in her sleep, she is on sleep medication Has dry eyes , uses restasis with improvement in symptoms    Methimazole  5 mg , HALF a tablet Monday through Thursday  (skip Friday, Saturday and Sunday)      HISTORY:  Past Medical History:  Past Medical History:  Diagnosis Date   Allergy    Anemia    Anxiety    Arthritis    Cataracts, bilateral    Gastritis    GERD (gastroesophageal reflux disease)    Heart murmur    Hypertension    Hyperthyroidism    OSA on CPAP    Sleep apnea    Squamous cell carcinoma of skin    Past Surgical History:  Past Surgical History:  Procedure Laterality Date   ABDOMINAL HYSTERECTOMY     ENDOMETRIAL ABLATION     EYE SURGERY     HERNIA REPAIR     as an infant   UPPER GASTROINTESTINAL ENDOSCOPY  11/09/2021   Has had  sevearal in [place]   WRIST ARTHROSCOPY Right    Social History:  reports that she has never smoked. She has never used smokeless tobacco. She reports current alcohol use of about 1.0 standard drink of alcohol per week. She reports that she does not use drugs. Family History:  Family History  Problem Relation Age of Onset   Ovarian cancer Mother    Stomach cancer Mother        mets from ovarian cancer   Arthritis Mother    Cancer Mother    Hearing loss Mother    Hypertension Mother    Heart disease Father    Cancer - Other Father        laryngeal cancer   Alcohol abuse Father    Arthritis Father    Hyperlipidemia Father    Hypertension Father    Hyperlipidemia Brother    Colon cancer Neg Hx    Esophageal cancer Neg Hx    Pancreatic cancer Neg Hx    Liver disease Neg Hx    Colon polyps Neg Hx    Rectal cancer Neg Hx      HOME MEDICATIONS: Allergies as of 11/30/2023       Reactions   Doxycycline  Nausea And Vomiting   Erythromycin  Macrobid [nitrofurantoin]    Penicillins    Sulfa Antibiotics         Medication List        Accurate as of November 30, 2023  2:14 PM. If you have any questions, ask your nurse or doctor.          acetaminophen 650 MG CR tablet Commonly known as: TYLENOL Take 650 mg by mouth at bedtime as needed for pain.   atorvastatin  20 MG tablet Commonly known as: LIPITOR Take 1 tablet (20 mg total) by mouth daily.   celecoxib  100 MG capsule Commonly known as: CELEBREX  TAKE 1 CAPSULE(100 MG) BY MOUTH TWICE DAILY AS NEEDED FOR MODERATE PAIN   denosumab  60 MG/ML Sosy injection Commonly known as: PROLIA  Inject 60 mg into the skin every 6 (six) months. Took in December 2022.   diclofenac Sodium 1 % Gel Commonly known as: VOLTAREN Apply topically 4 (four) times daily.   dicyclomine  10 MG capsule Commonly known as: BENTYL  Take 1 capsule (10 mg total) by mouth every 8 (eight) hours as needed (abdominal cramping/diarrhea).    fluticasone  50 MCG/ACT nasal spray Commonly known as: Flonase  Place 1 spray into both nostrils 2 (two) times daily.   irbesartan  75 MG tablet Commonly known as: AVAPRO  TAKE 1 TABLET(75 MG) BY MOUTH DAILY   Magnesium 400 MG Tabs Take 400 mg by mouth 3 (three) times daily.   methimazole  5 MG tablet Commonly known as: TAPAZOLE  Take 0.5 tablets (2.5 mg total) by mouth as directed. Half a tablet Monday through Thursday only ,none the rest of the week   pantoprazole  40 MG tablet Commonly known as: PROTONIX  TAKE 1 TABLET(40 MG) BY MOUTH TWICE DAILY   Restasis 0.05 % ophthalmic emulsion Generic drug: cycloSPORINE 1 drop 2 (two) times daily.   VITAMIN B-12 IJ Inject as directed every 30 (thirty) days.   zaleplon  5 MG capsule Commonly known as: SONATA  1 or 2 caps as needed for sleep          OBJECTIVE:   PHYSICAL EXAM: VS: BP 120/74 (BP Location: Left Arm, Patient Position: Sitting, Cuff Size: Small)   Pulse 64   Ht 5' 3.5 (1.613 m)   Wt 158 lb (71.7 kg)   SpO2 98%   BMI 27.55 kg/m    EXAM: General: Pt appears well and is in NAD  Neck: General: Supple without adenopathy. Thyroid : Thyroid  size normal.  No goiter or nodules appreciated.   Lungs: Clear with good BS bilat   Heart: Auscultation: RRR.  Abdomen:  soft, nontender  Extremities:  BL LE: No pretibial edema   Mental Status: Judgment, insight: Intact Mood and affect: No depression, anxiety, or agitation     DATA REVIEWED:   Latest Reference Range & Units 11/30/23 14:34  TSH 0.40 - 4.50 mIU/L 2.08  Triiodothyronine,Free,Serum 2.3 - 4.2 pg/mL 3.0  T4,Free(Direct) 0.8 - 1.8 ng/dL 1.3    Results for LUDWIKA, RODD (MRN [id number]) as of 04/09/2021 07:59  Ref. Range 12/03/2020 09:39  TRAB Latest Ref Range: <=2.00 IU/L 1.02    ASSESSMENT / PLAN / RECOMMENDATIONS:   Hyperthyroidism :    - This has been attributed to Graves' disease through a prior diagnosis. TRAB here was detectable but not elevated   at 1.02 IU/L but she already has been on methimazole  at the time  - No local neck symptoms  -Patient is clinically euthyroid -TFTs remain normal will decrease methimazole  as below   Medications  Decrease methimazole  5 mg , HALF  a tablet Monday through Wednesday (skip Thursday, Friday, Saturday and Sunday)   2. Graves' Disease:   - No extra thyroidal manifestations of Graves' Disease   F/U in 6 months      Signed electronically by: Stefano Redgie Butts, MD  [facility]  [facility] [address] [place], [place] [address] Phone: [phone] FAX: [phone]      CC: Theophilus Andrews, Tully GRADE, MD [address] [place] [address] Phone: [phone]  Fax: [phone]   Return to Endocrinology clinic as below: Future Appointments  Date Time Provider Department Center  11/30/2023  2:20 PM Vendetta Pittinger, Donell Redgie, MD [facility] None  12/12/2023  9:30 AM [facility]

## 2023-12-01 ENCOUNTER — Other Ambulatory Visit: Payer: Self-pay | Admitting: Family Medicine

## 2023-12-01 ENCOUNTER — Encounter: Payer: Self-pay | Admitting: Internal Medicine

## 2023-12-01 ENCOUNTER — Ambulatory Visit: Payer: Medicare Other | Admitting: Internal Medicine

## 2023-12-01 DIAGNOSIS — G8929 Other chronic pain: Secondary | ICD-10-CM

## 2023-12-01 LAB — T3, FREE: T3, Free: 3 pg/mL (ref 2.3–4.2)

## 2023-12-01 LAB — TSH: TSH: 2.08 m[IU]/L (ref 0.40–4.50)

## 2023-12-01 LAB — T4, FREE: Free T4: 1.3 ng/dL (ref 0.8–1.8)

## 2023-12-01 MED ORDER — METHIMAZOLE 5 MG PO TABS
2.5000 mg | ORAL_TABLET | ORAL | 2 refills | Status: DC
Start: 2023-12-01 — End: 2024-05-31

## 2023-12-01 MED ORDER — ZALEPLON 5 MG PO CAPS: ORAL_CAPSULE | ORAL | 5 refills | Status: DC

## 2023-12-01 NOTE — Telephone Encounter (Signed)
 Rx refill request approved per Dr. Zollie Pee orders.

## 2023-12-01 NOTE — Telephone Encounter (Signed)
Zaleplon refilled 

## 2023-12-12 ENCOUNTER — Ambulatory Visit (INDEPENDENT_AMBULATORY_CARE_PROVIDER_SITE_OTHER): Payer: Medicare Other | Admitting: *Deleted

## 2023-12-12 DIAGNOSIS — E538 Deficiency of other specified B group vitamins: Secondary | ICD-10-CM | POA: Diagnosis not present

## 2023-12-12 MED ORDER — CYANOCOBALAMIN 1000 MCG/ML IJ SOLN
1000.0000 ug | Freq: Once | INTRAMUSCULAR | Status: AC
Start: 2023-12-12 — End: 2023-12-12
  Administered 2023-12-12: 1000 ug via INTRAMUSCULAR

## 2023-12-12 NOTE — Progress Notes (Deleted)
 Per orders of Dr. Ardyth Harps, injection of B12 given by Kern Reap. Patient tolerated injection well.

## 2023-12-12 NOTE — Progress Notes (Signed)
Per orders of Dr. Burchette, injection of B12 given by Jacquetta Polhamus. Patient tolerated injection well. 

## 2023-12-22 ENCOUNTER — Other Ambulatory Visit: Payer: Self-pay | Admitting: Internal Medicine

## 2024-01-05 ENCOUNTER — Ambulatory Visit: Admitting: Family Medicine

## 2024-01-05 NOTE — Progress Notes (Deleted)
   Rubin Payor, PhD, LAT, ATC acting as a scribe for Clementeen Graham, MD.  Berenize Gatlin is a 73 y.o. female who presents to [facility] at [facility] today for cont'd bilat hip pain. Pt was last seen by Dr. Denyse Amass on 09/26/23 and we re-authorized PT, completing 13 total visits (d/c 11/07/23).   Today, pt reports ***  Dx testing: 08/10/23 L hip XR 08/10/23 Labs 04/26/23 L-spine MRI 04/11/23 DEXA scan             10/24/23 L-spine XR  05/03/21 R hip MRI  04/29/21 R hip XR   Pertinent review of systems: ***  Relevant historical information: ***   Exam:  There were no vitals taken for this visit. General: Well Developed, well nourished, and in no acute distress.   MSK: ***    Lab and Radiology Results No results found for this or any previous visit (from the past 72 hours). No results found.     Assessment and Plan: 73 y.o. female with ***   PDMP not reviewed this encounter. No orders of the defined types were placed in this encounter.  No orders of the defined types were placed in this encounter.    Discussed warning signs or symptoms. Please see discharge instructions. Patient expresses understanding.   ***

## 2024-01-06 NOTE — Progress Notes (Signed)
   Rubin Payor, PhD, LAT, ATC acting as a scribe for Bethany Graham, MD.  Bethany Cole is a 73 y.o. female who presents to [facility] at [facility] today for cont'd bilat hip pain. Pt was last seen by Dr. Denyse Amass on 09/26/23 and we re-authorized PT, completing 13 total visits (d/c 11/07/23).   Today, pt reports she improved w/ PT, but couldn't get the pain to fully resolve. She is unable to walk very far and will use a cane if pain is severe. On the lateral aspect of her R hip and sometimes in the groin. L hip pain is intermittent and infrequent  Dx testing: 08/10/23 L hip XR 08/10/23 Labs 04/26/23 L-spine MRI 04/11/23 DEXA scan             10/24/23 L-spine XR  05/03/21 R hip MRI  04/29/21 R hip XR   Pertinent review of systems: no fever or chills  Relevant historical information: BL Hip pain Hypertension and osteoporosis.  Exam:  BP (!) 184/92   Pulse 60   Ht 5' 3.5" (1.613 m)   SpO2 98%   BMI 27.55 kg/m  General: Well Developed, well nourished, and in no acute distress.   MSK: Right hip normal-appearing tender palpation greater trochanter.    Lab and Radiology Results  Procedure: Real-time Ultrasound Guided Injection of right lateral hip greater trochanter bursa Device: Philips Affiniti 50G/GE Logiq Images permanently stored and available for review in PACS Verbal informed consent obtained.  Discussed risks and benefits of procedure. Warned about infection, bleeding, hyperglycemia damage to structures among others. Patient expresses understanding and agreement Time-out conducted.   Noted no overlying erythema, induration, or other signs of local infection.   Skin prepped in a sterile fashion.   Local anesthesia: Topical Ethyl chloride.   With sterile technique and under real time ultrasound guidance: 40 mg of Kenalog and 2 mg of Marcaine injected into greater trochanter bursa. Fluid seen entering the bursa.   Completed without difficulty   Pain  immediately resolved suggesting accurate placement of the medication.   Advised to call if fevers/chills, erythema, induration, drainage, or persistent bleeding.   Images permanently stored and available for review in the ultrasound unit.  Impression: Technically successful ultrasound guided injection.        Assessment and Plan: 73 y.o. female with chronic right hip pain.  Patient did have an MRI for this in 2022 that showed only mild arthritis and tendinitis.  Additionally she had an x-ray that did include her right hip last year which showed mild hip arthritis.  She has a lot of physical therapy for this recently with still continued lateral hip pain.  She has had pretty good results with left hip greater trochanter bursa injection.  Plan for right greater trochanter bursa injection today and consider intra-articular injection in the near future.  Patient will keep me updated with how she feels.   PDMP not reviewed this encounter. Orders Placed This Encounter  Procedures   [place] LIMITED JOINT SPACE STRUCTURES LOW LEFT(NO LINKED CHARGES)    Reason for Exam (SYMPTOM  OR DIAGNOSIS REQUIRED):   left hip pain    Preferred imaging location?:   [facility]   No orders of the defined types were placed in this encounter.    Discussed warning signs or symptoms. Please see discharge instructions. Patient expresses understanding.   The above documentation has been reviewed and is accurate and complete Bethany Cole, M.D.

## 2024-01-09 ENCOUNTER — Ambulatory Visit: Admitting: Family Medicine

## 2024-01-09 ENCOUNTER — Other Ambulatory Visit: Payer: Self-pay

## 2024-01-09 VITALS — BP 184/92 | HR 60 | Ht 63.5 in

## 2024-01-09 DIAGNOSIS — M25551 Pain in right hip: Secondary | ICD-10-CM

## 2024-01-09 DIAGNOSIS — M7061 Trochanteric bursitis, right hip: Secondary | ICD-10-CM

## 2024-01-09 DIAGNOSIS — G8929 Other chronic pain: Secondary | ICD-10-CM | POA: Diagnosis not present

## 2024-01-09 DIAGNOSIS — M7062 Trochanteric bursitis, left hip: Secondary | ICD-10-CM | POA: Diagnosis not present

## 2024-01-09 NOTE — Patient Instructions (Addendum)
 Thank you for coming in today.   You received an injection today. Seek immediate medical attention if the joint becomes red, extremely painful, or is oozing fluid.   Continue working on your home exercises  Let me know if pain doesn't get better and I can try an injection in a different spot

## 2024-01-10 ENCOUNTER — Ambulatory Visit (INDEPENDENT_AMBULATORY_CARE_PROVIDER_SITE_OTHER): Admitting: *Deleted

## 2024-01-10 DIAGNOSIS — E538 Deficiency of other specified B group vitamins: Secondary | ICD-10-CM | POA: Diagnosis not present

## 2024-01-10 MED ORDER — CYANOCOBALAMIN 1000 MCG/ML IJ SOLN
1000.0000 ug | Freq: Once | INTRAMUSCULAR | Status: AC
Start: 2024-01-10 — End: 2024-01-10
  Administered 2024-01-10: 1000 ug via INTRAMUSCULAR

## 2024-01-10 NOTE — Progress Notes (Signed)
 Per orders of Dr. Ardyth Harps, injection of B12 given by Kern Reap. Patient tolerated injection well.

## 2024-02-09 ENCOUNTER — Ambulatory Visit (INDEPENDENT_AMBULATORY_CARE_PROVIDER_SITE_OTHER)

## 2024-02-09 DIAGNOSIS — E538 Deficiency of other specified B group vitamins: Secondary | ICD-10-CM | POA: Diagnosis not present

## 2024-02-09 MED ORDER — CYANOCOBALAMIN 1000 MCG/ML IJ SOLN
1000.0000 ug | Freq: Once | INTRAMUSCULAR | Status: AC
Start: 2024-02-09 — End: 2024-02-09
  Administered 2024-02-09: 1000 ug via INTRAMUSCULAR

## 2024-02-09 NOTE — Progress Notes (Signed)
 Patient is in office today for a nurse visit for B12 Injection. Patient Injection was given in the  Right deltoid. Patient tolerated injection well.

## 2024-02-16 ENCOUNTER — Encounter (INDEPENDENT_AMBULATORY_CARE_PROVIDER_SITE_OTHER): Payer: Medicare Other | Admitting: Family Medicine

## 2024-02-16 NOTE — Progress Notes (Signed)
 error

## 2024-02-25 ENCOUNTER — Other Ambulatory Visit: Payer: Self-pay | Admitting: Internal Medicine

## 2024-02-25 DIAGNOSIS — I10 Essential (primary) hypertension: Secondary | ICD-10-CM

## 2024-03-15 DIAGNOSIS — H35371 Puckering of macula, right eye: Secondary | ICD-10-CM | POA: Diagnosis not present

## 2024-03-15 DIAGNOSIS — H35412 Lattice degeneration of retina, left eye: Secondary | ICD-10-CM | POA: Diagnosis not present

## 2024-03-15 DIAGNOSIS — H15832 Staphyloma posticum, left eye: Secondary | ICD-10-CM | POA: Diagnosis not present

## 2024-03-15 DIAGNOSIS — H442A2 Degenerative myopia with choroidal neovascularization, left eye: Secondary | ICD-10-CM | POA: Diagnosis not present

## 2024-03-15 DIAGNOSIS — H35361 Drusen (degenerative) of macula, right eye: Secondary | ICD-10-CM | POA: Diagnosis not present

## 2024-03-15 DIAGNOSIS — H43811 Vitreous degeneration, right eye: Secondary | ICD-10-CM | POA: Diagnosis not present

## 2024-03-23 ENCOUNTER — Ambulatory Visit (INDEPENDENT_AMBULATORY_CARE_PROVIDER_SITE_OTHER)

## 2024-03-23 DIAGNOSIS — E538 Deficiency of other specified B group vitamins: Secondary | ICD-10-CM | POA: Diagnosis not present

## 2024-03-23 MED ORDER — CYANOCOBALAMIN 1000 MCG/ML IJ SOLN
1000.0000 ug | Freq: Once | INTRAMUSCULAR | Status: AC
Start: 2024-03-23 — End: 2024-03-23
  Administered 2024-03-23: 1000 ug via INTRAMUSCULAR

## 2024-03-23 NOTE — Progress Notes (Signed)
Per orders of Dr. Hernandez , injection of Cyanocobalamin 1,000 mcg/mL given by  N . °Patient tolerated injection well. ° °

## 2024-04-03 ENCOUNTER — Ambulatory Visit (INDEPENDENT_AMBULATORY_CARE_PROVIDER_SITE_OTHER): Admitting: Family Medicine

## 2024-04-03 ENCOUNTER — Ambulatory Visit: Payer: Self-pay | Admitting: Family Medicine

## 2024-04-03 ENCOUNTER — Encounter: Payer: Self-pay | Admitting: Family Medicine

## 2024-04-03 VITALS — BP 126/80 | HR 73 | Temp 98.0°F | Resp 16 | Ht 63.5 in | Wt 162.2 lb

## 2024-04-03 DIAGNOSIS — R600 Localized edema: Secondary | ICD-10-CM | POA: Diagnosis not present

## 2024-04-03 DIAGNOSIS — F419 Anxiety disorder, unspecified: Secondary | ICD-10-CM

## 2024-04-03 DIAGNOSIS — I8393 Asymptomatic varicose veins of bilateral lower extremities: Secondary | ICD-10-CM | POA: Diagnosis not present

## 2024-04-03 LAB — BASIC METABOLIC PANEL WITH GFR
BUN: 20 mg/dL (ref 6–23)
CO2: 29 meq/L (ref 19–32)
Calcium: 9.3 mg/dL (ref 8.4–10.5)
Chloride: 100 meq/L (ref 96–112)
Creatinine, Ser: 0.58 mg/dL (ref 0.40–1.20)
GFR: 89.18 mL/min (ref >60.00)
Glucose, Bld: 79 mg/dL (ref 70–99)
Potassium: 3.8 meq/L (ref 3.5–5.1)
Sodium: 135 meq/L (ref 135–145)

## 2024-04-03 LAB — MICROALBUMIN / CREATININE URINE RATIO
Creatinine,U: 19.6 mg/dL
Microalb Creat Ratio: UNDETERMINED mg/g (ref 0.0–30.0)
Microalb, Ur: <0.7 mg/dL

## 2024-04-03 NOTE — Patient Instructions (Addendum)
 A few things to remember from today's visit:  Bilateral lower extremity edema - Plan: Basic metabolic panel with GFR, Microalbumin / creatinine urine ratio  Varicose veins of both lower extremities, unspecified whether complicated  Anxiety disorder, unspecified type Most likely leg swelling is caused by varicose veins. Lower extremity elevation and compression stockings will help.  If you need refills for medications you take chronically, please call your pharmacy. Do not use My Chart to request refills or for acute issues that need immediate attention. If you send a my chart message, it may take a few days to be addressed, specially if I am not in the office.  Please be sure medication list is accurate. If a new problem present, please set up appointment sooner than planned today.

## 2024-04-03 NOTE — Progress Notes (Signed)
 ACUTE VISIT Chief Complaint  Patient presents with   Foot Swelling    Both feet, but left foot is usually worse    HPI: BethanyDelane Cole is a 73 y.o. female with a PMHx significant for HTN, OSA on CPAP, GERD, hyperthyroidism, osteoporosis, B12 deficiency, chronic feet pain, and HLD, among some, who is here today complaining of ankle/feet swelling.   Patient complains of swelling in both ankles for a couple of months. The left is slightly worse than the right, and she gets some swelling in her left foot.  She endorses some achy pain in the arch of her left foot, which worsens when her foot swells.  The swelling has been stable since it's onset.  Most of the time edema is better in the morning and worse as the day goes.  She has tried to elevate her feet, but it hasn't helped much.  Pertinent negatives include recent foot injury, chest pain, SOB, palpitations, gross hematuria, foamy urine, surgical procedures, or recent long travel.   Lab Results  Component Value Date   NA 131 (L) 09/26/2023   CL 97 09/26/2023   K 4.7 09/26/2023   CO2 28 09/26/2023   BUN 13 09/26/2023   CREATININE 0.68 09/26/2023   GFR 86.14 09/26/2023   CALCIUM  9.5 09/26/2023   PHOS 3.3 09/26/2023   ALBUMIN 4.3 09/26/2023   GLUCOSE 88 09/26/2023   Toenails She also complains of problems cutting her toenails because they are hardening and her toes are curls.   Anxiety/depression:  She also asks about getting a referral to psychotherapy for anxiety or depression. Her anxiety has been worse over the past few months.  She has taken medication in the past but cannot recall which one.   Review of Systems  Constitutional:  Positive for fatigue. Negative for activity change and appetite change.  Respiratory:  Negative for cough and wheezing.   Gastrointestinal:  Negative for abdominal pain, nausea and vomiting.  Endocrine: Negative for cold intolerance and heat intolerance.  Genitourinary:  Negative for  decreased urine volume and dysuria.  Musculoskeletal:  Negative for gait problem and myalgias.  Skin:  Negative for rash and wound.  See other pertinent positives and negatives in HPI.  Current Outpatient Medications on File Prior to Visit  Medication Sig Dispense Refill   acetaminophen (TYLENOL) 650 MG CR tablet Take 650 mg by mouth at bedtime as needed for pain.     atorvastatin  (LIPITOR) 20 MG tablet TAKE 1 TABLET(20 MG) BY MOUTH DAILY 90 tablet 1   celecoxib  (CELEBREX ) 100 MG capsule TAKE 1 CAPSULE(100 MG) BY MOUTH TWICE DAILY AS NEEDED FOR MODERATE PAIN 60 capsule 1   Cyanocobalamin  (VITAMIN B-12 IJ) Inject as directed every 30 (thirty) days.     denosumab  (PROLIA ) 60 MG/ML SOSY injection Inject 60 mg into the skin every 6 (six) months. Took in December 2022.     diclofenac Sodium (VOLTAREN) 1 % GEL Apply topically 4 (four) times daily.     dicyclomine  (BENTYL ) 10 MG capsule Take 1 capsule (10 mg total) by mouth every 8 (eight) hours as needed (abdominal cramping/diarrhea). 45 capsule 1   irbesartan  (AVAPRO ) 75 MG tablet TAKE 1 TABLET(75 MG) BY MOUTH DAILY 90 tablet 0   Magnesium 400 MG TABS Take 400 mg by mouth 3 (three) times daily.     methimazole  (TAPAZOLE ) 5 MG tablet Take 0.5 tablets (2.5 mg total) by mouth as directed. Half a tablet Monday through Wednesday  only ,none the rest of  the week 20 tablet 2   pantoprazole  (PROTONIX ) 40 MG tablet TAKE 1 TABLET(40 MG) BY MOUTH TWICE DAILY 180 tablet 1   RESTASIS 0.05 % ophthalmic emulsion 1 drop 2 (two) times daily.     zaleplon  (SONATA ) 5 MG capsule 1 or 2 caps as needed for sleep 30 capsule 5   fluticasone  (FLONASE ) 50 MCG/ACT nasal spray Place 1 spray into both nostrils 2 (two) times daily. 16 g 0   No current facility-administered medications on file prior to visit.   Past Medical History:  Diagnosis Date   Allergy    Anemia    Anxiety    Arthritis    Cataracts, bilateral    Gastritis    GERD (gastroesophageal reflux disease)     Heart murmur    Hypertension    Hyperthyroidism    OSA on CPAP    Sleep apnea    Squamous cell carcinoma of skin    Allergies  Allergen Reactions   Doxycycline  Nausea And Vomiting   Erythromycin    Macrobid [Nitrofurantoin]    Penicillins    Sulfa Antibiotics    Social History   Socioeconomic History   Marital status: Divorced    Spouse name: Not on file   Number of children: Not on file   Years of education: Not on file   Highest education level: Not on file  Occupational History   Not on file  Tobacco Use   Smoking status: Never   Smokeless tobacco: Never  Vaping Use   Vaping status: Never Used  Substance and Sexual Activity   Alcohol use: Yes    Alcohol/week: 1.0 standard drink of alcohol    Types: 1 Glasses of wine per week    Comment: occasional   Drug use: Never   Sexual activity: Not Currently    Birth control/protection: None  Other Topics Concern   Not on file  Social History Narrative   Not on file   Social Drivers of Health   Financial Resource Strain: Low Risk  (03/08/2023)   Overall Financial Resource Strain (CARDIA)    Difficulty of Paying Living Expenses: Not hard at all  Food Insecurity: No Food Insecurity (03/08/2023)   Hunger Vital Sign    Worried About Running Out of Food in the Last Year: Never true    Ran Out of Food in the Last Year: Never true  Transportation Needs: No Transportation Needs (03/08/2023)   PRAPARE - Administrator, Civil Service (Medical): No    Lack of Transportation (Non-Medical): No  Physical Activity: Sufficiently Active (03/08/2023)   Exercise Vital Sign    Days of Exercise per Week: 5 days    Minutes of Exercise per Session: 50 min  Stress: No Stress Concern Present (03/08/2023)   [employer] of Occupational Health - Occupational Stress Questionnaire    Feeling of Stress : Only a little  Social Connections: Moderately Isolated (03/08/2023)   Social Connection and Isolation Panel [NHANES]     Frequency of Communication with Friends and Family: Twice a week    Frequency of Social Gatherings with Friends and Family: Twice a week    Attends Religious Services: Never    Database administrator or Organizations: Yes    Attends Engineer, structural: More than 4 times per year    Marital Status: Divorced   Vitals:   04/03/24 1250  BP: 126/80  Pulse: 73  Resp: 16  Temp: 98 F (36.7 C)  SpO2: 98%  Body mass index is 28.29 kg/m.  Physical Exam Vitals and nursing note reviewed.  Constitutional:      General: She is not in acute distress.    Appearance: She is well-developed.  HENT:     Head: Normocephalic and atraumatic.  Eyes:     Conjunctiva/sclera: Conjunctivae normal.  Cardiovascular:     Rate and Rhythm: Normal rate and regular rhythm.     Pulses:          Dorsalis pedis pulses are 2+ on the right side and 2+ on the left side.     Heart sounds: No murmur heard.    Comments: Bilateral periankle pitting edema 1+ L>R; trace pitting edema left lower extremity; varicose veins bilaterally, L>R Pulmonary:     Effort: Pulmonary effort is normal. No respiratory distress.     Breath sounds: Normal breath sounds.  Musculoskeletal:     Right lower leg: Pitting Edema present.     Left lower leg: Pitting Edema present.  Lymphadenopathy:     Cervical: No cervical adenopathy.  Skin:    General: Skin is warm.     Findings: No erythema or rash.  Neurological:     General: No focal deficit present.     Mental Status: She is alert and oriented to person, place, and time.     Gait: Gait normal.  Psychiatric:        Mood and Affect: Mood and affect normal.    ASSESSMENT AND PLAN:  Bethany Cole was seen today for foot swelling.  Lab Results  Component Value Date   NA 135 04/03/2024   CL 100 04/03/2024   K 3.8 04/03/2024   CO2 29 04/03/2024   BUN 20 04/03/2024   CREATININE 0.58 04/03/2024   GFR 89.18 04/03/2024   CALCIUM  9.3 04/03/2024   PHOS 3.3 09/26/2023    ALBUMIN 4.3 09/26/2023   GLUCOSE 79 04/03/2024   Lab Results  Component Value Date   MICROALBUR <0.7 04/03/2024   Bilateral lower extremity edema We discussed possible etiologies. History and examination today do not suggest a serious process. Most likely related with venous insufficiency. Lower extremity elevation and compression stockings recommended. I do not think diuretic will help at this time. Instructed about warning signs. Further recommendation will be given according to lab results.  -     Basic metabolic panel with GFR; Future -     Microalbumin / creatinine urine ratio; Future  Varicose veins of both lower extremities, unspecified whether complicated We discussed diagnosis, prognosis, and treatment options. Recommend compression stockings. Continue appropriate skin care.  Anxiety disorder, unspecified type She feels the problem is getting worse. She is not interested in medication at this time. Would like to try CBT first, a list of providers at [facility] behavioral [facility] given, so she can call and arrange an appointment.  In regard to hypertrophic /long toenails, recommend establishing with podiatrist.  Having a pedicure regularly may also help. Continue appropriate footcare.  Return if symptoms worsen or fail to improve, for keep next appointment.  I, Fritz Jewel Wierda, acting as a scribe for Yaacov Koziol [place], MD., have documented all relevant documentation on the behalf of Khalfani Weideman [place], MD, as directed by  Reine Bristow [place], MD while in the presence of Laelani Vasko [place], MD.   I, Cleo Santucci [place], MD, have reviewed all documentation for this visit. The documentation on 04/03/24 for the exam, diagnosis, procedures, and orders are all accurate and complete.  Amaiah Cristiano G. [place], MD  [facility]. [place]  office.

## 2024-04-06 NOTE — Telephone Encounter (Signed)
 Prolia  VOB initiated via [email or web address]  Next Prolia  inj DUE: 05/02/24

## 2024-04-09 NOTE — Telephone Encounter (Signed)
 Medical Buy and Annette Stable - Prior Authorization REQUIRED for [employer]  PA PROCESS DETAILS: Please ensure your patient meets medical necessity by reviewing Medical Policy  [phone] at [email or web address], and obtaining a pre-determination by contacting the PA dept at [phone].

## 2024-04-11 NOTE — Telephone Encounter (Signed)
 Medical Buy and Raenette Bumps  Prior Auth on file and valid PA# [id number] Valid: 10/03/23-10/02/24

## 2024-04-12 NOTE — Telephone Encounter (Addendum)
 Medical Buy and Raenette Bumps  Patient is ready for scheduling on or after 05/02/24  Out-of-pocket cost due at time of visit: $361.87   Primary: [facility] AARP Medicare Advantage L-PPO Prolia  co-insurance: 20% (approximately $331.87) Admin fee co-insurance: $30  Deductible: does not apply  Prior Auth: APPROVED PA# [id number] Valid: 10/03/23-10/02/24  Secondary: N/A Prolia  co-insurance:  Admin fee co-insurance:  Deductible:  Prior Auth:  PA# Valid:   ** This summary of benefits is an estimation of the patient's out-of-pocket cost. Exact cost may vary based on individual plan coverage.

## 2024-04-20 NOTE — Telephone Encounter (Signed)
 Scheduled

## 2024-04-23 ENCOUNTER — Ambulatory Visit

## 2024-04-23 ENCOUNTER — Ambulatory Visit: Admitting: Internal Medicine

## 2024-04-24 ENCOUNTER — Ambulatory Visit (INDEPENDENT_AMBULATORY_CARE_PROVIDER_SITE_OTHER)

## 2024-04-24 DIAGNOSIS — E538 Deficiency of other specified B group vitamins: Secondary | ICD-10-CM

## 2024-04-24 MED ORDER — CYANOCOBALAMIN 1000 MCG/ML IJ SOLN
1000.0000 ug | Freq: Once | INTRAMUSCULAR | Status: AC
  Administered 2024-04-24: 1000 ug via INTRAMUSCULAR

## 2024-04-24 NOTE — Progress Notes (Signed)
 Per orders of Dr. Theophilus, injection of Cyanocobalamin  1,000 mcg/mL given by Rea DELENA Silvan. Patient tolerated injection well.

## 2024-04-30 ENCOUNTER — Telehealth: Payer: Self-pay | Admitting: Gastroenterology

## 2024-04-30 NOTE — Telephone Encounter (Signed)
 Inbound call from patient requesting a refill for pantoprazole  medication. States she missed her pick up day by 1 day and pharmacy is refusing to refill it. Please advise, thank you.

## 2024-05-01 ENCOUNTER — Other Ambulatory Visit: Payer: Self-pay

## 2024-05-01 DIAGNOSIS — K219 Gastro-esophageal reflux disease without esophagitis: Secondary | ICD-10-CM

## 2024-05-01 MED ORDER — PANTOPRAZOLE SODIUM 40 MG PO TBEC: DELAYED_RELEASE_TABLET | ORAL | 1 refills | Status: AC

## 2024-05-01 NOTE — Telephone Encounter (Signed)
 Rx sent and pt informed. She has no further questions or concerns at this time.   Electronically signed:  Corean [place], NCMA

## 2024-05-02 ENCOUNTER — Ambulatory Visit

## 2024-05-02 DIAGNOSIS — M810 Age-related osteoporosis without current pathological fracture: Secondary | ICD-10-CM | POA: Diagnosis not present

## 2024-05-02 MED ORDER — DENOSUMAB 60 MG/ML ~~LOC~~ SOSY
60.0000 mg | PREFILLED_SYRINGE | Freq: Once | SUBCUTANEOUS | Status: AC
  Administered 2024-05-02: 60 mg via SUBCUTANEOUS

## 2024-05-02 NOTE — Progress Notes (Signed)
 Patient received PROLIA  injection today, SQ, left upper arm,  tolerated well.   Medical Buy and [employer]  MFG: [employer] LOT#: [id number] EXP: [id number] NDC: [id number]   Prior Auth: APPROVED PA# [id number] Valid: 10/03/23-10/02/24

## 2024-05-02 NOTE — Telephone Encounter (Signed)
 Last Prolia  inj 05/02/24 Next Prolia  inj due 11/03/24

## 2024-05-08 ENCOUNTER — Other Ambulatory Visit: Payer: Self-pay | Admitting: Internal Medicine

## 2024-05-08 NOTE — Telephone Encounter (Signed)
**Note De-identified  [place] Obfuscation** Please advise 

## 2024-05-09 NOTE — Telephone Encounter (Signed)
Sonata refilled ?

## 2024-05-24 DIAGNOSIS — H53002 Unspecified amblyopia, left eye: Secondary | ICD-10-CM | POA: Diagnosis not present

## 2024-05-24 DIAGNOSIS — H524 Presbyopia: Secondary | ICD-10-CM | POA: Diagnosis not present

## 2024-05-24 DIAGNOSIS — Z961 Presence of intraocular lens: Secondary | ICD-10-CM | POA: Diagnosis not present

## 2024-05-30 ENCOUNTER — Ambulatory Visit (INDEPENDENT_AMBULATORY_CARE_PROVIDER_SITE_OTHER): Payer: Medicare Other | Admitting: Internal Medicine

## 2024-05-30 ENCOUNTER — Other Ambulatory Visit: Payer: Self-pay | Admitting: Internal Medicine

## 2024-05-30 VITALS — BP 120/70 | HR 66 | Ht 63.5 in | Wt 163.6 lb

## 2024-05-30 DIAGNOSIS — E0590 Thyrotoxicosis, unspecified without thyrotoxic crisis or storm: Secondary | ICD-10-CM | POA: Diagnosis not present

## 2024-05-30 DIAGNOSIS — I10 Essential (primary) hypertension: Secondary | ICD-10-CM

## 2024-05-30 DIAGNOSIS — E0500 Thyrotoxicosis with diffuse goiter without thyrotoxic crisis or storm: Secondary | ICD-10-CM | POA: Diagnosis not present

## 2024-05-30 LAB — T3, FREE: T3, Free: 3.1 pg/mL (ref 2.3–4.2)

## 2024-05-30 LAB — TSH: TSH: 2.08 m[IU]/L (ref 0.40–4.50)

## 2024-05-30 LAB — T4, FREE: Free T4: 1.3 ng/dL (ref 0.8–1.8)

## 2024-05-30 NOTE — Progress Notes (Unsigned)
 Name: Bethany Cole  MRN/ DOB: [id number], 1951-06-18    Age/ Sex: 73 y.o., female     PCP: Theophilus Andrews, Tully GRADE, MD   Reason for Endocrinology Evaluation: Hyperthyroidism     Initial Endocrinology Clinic Visit: 12/03/2020    PATIENT IDENTIFIER: Bethany Cole is a 73 y.o., female with a past medical history of HTN, GERD and Hyperthyroidism. She has followed with [facility] since 12/03/2020 for consultative assistance with management of her Hyperthyroidism.    Moved from [place]  04/2020  HISTORICAL SUMMARY:  She has been diagnosed with hyperthyroidism in 11/2019 during routine work up with a TSH of 0.15 uIU/L , FT4 1.5 ng/dL ( 9.2-8.4 ng/dL) and an elevated T3 5.69 pg/mL ( 1.7-3.7) . She had worsening anxiety and tremors at the time , this was attributed to Grave's Disease  On her initial visit at our clinic she was on methimazole  2.5 mg daily    No FH of thyroid  disease    SUBJECTIVE:     Today (05/31/2024):  Bethany Cole is here for hyperthyroidism.    Weight continues to fluctuate No eye exams,  up to date on eye exam  Denies palpitation  Has occasional changes in bowel movements Denies local neck symptoms  Has noted occasional tremors    Methimazole  5 mg , HALF a tablet Monday through Wednesday (skip Thursday, Friday, Saturday and Sunday)      HISTORY:  Past Medical History:  Past Medical History:  Diagnosis Date   Allergy    Anemia    Anxiety    Arthritis    Cataracts, bilateral    Gastritis    GERD (gastroesophageal reflux disease)    Heart murmur    Hypertension    Hyperthyroidism    OSA on CPAP    Sleep apnea    Squamous cell carcinoma of skin    Past Surgical History:  Past Surgical History:  Procedure Laterality Date   ABDOMINAL HYSTERECTOMY     ENDOMETRIAL ABLATION     EYE SURGERY     HERNIA REPAIR     as an infant   UPPER GASTROINTESTINAL ENDOSCOPY  11/09/2021   Has had sevearal in [place]   WRIST ARTHROSCOPY  Right    Social History:  reports that she has never smoked. She has never used smokeless tobacco. She reports current alcohol use of about 1.0 standard drink of alcohol per week. She reports that she does not use drugs. Family History:  Family History  Problem Relation Age of Onset   Ovarian cancer Mother    Stomach cancer Mother        mets from ovarian cancer   Arthritis Mother    Cancer Mother    Hearing loss Mother    Hypertension Mother    Heart disease Father    Cancer - Other Father        laryngeal cancer   Alcohol abuse Father    Arthritis Father    Hyperlipidemia Father    Hypertension Father    Hyperlipidemia Brother    Colon cancer Neg Hx    Esophageal cancer Neg Hx    Pancreatic cancer Neg Hx    Liver disease Neg Hx    Colon polyps Neg Hx    Rectal cancer Neg Hx      HOME MEDICATIONS: Allergies as of 05/30/2024       Reactions   Doxycycline  Nausea And Vomiting   Erythromycin    Macrobid [nitrofurantoin]    Penicillins  Sulfa Antibiotics         Medication List        Accurate as of May 30, 2024 11:59 PM. If you have any questions, ask your nurse or doctor.          acetaminophen 650 MG CR tablet Commonly known as: TYLENOL Take 650 mg by mouth at bedtime as needed for pain.   atorvastatin  20 MG tablet Commonly known as: LIPITOR TAKE 1 TABLET(20 MG) BY MOUTH DAILY   celecoxib  100 MG capsule Commonly known as: CELEBREX  TAKE 1 CAPSULE(100 MG) BY MOUTH TWICE DAILY AS NEEDED FOR MODERATE PAIN   denosumab  60 MG/ML Sosy injection Commonly known as: PROLIA  Inject 60 mg into the skin every 6 (six) months. Took in December 2022.   diclofenac Sodium 1 % Gel Commonly known as: VOLTAREN Apply topically 4 (four) times daily.   dicyclomine  10 MG capsule Commonly known as: BENTYL  Take 1 capsule (10 mg total) by mouth every 8 (eight) hours as needed (abdominal cramping/diarrhea).   fluticasone  50 MCG/ACT nasal spray Commonly known as:  Flonase  Place 1 spray into both nostrils 2 (two) times daily.   irbesartan  75 MG tablet Commonly known as: AVAPRO  TAKE 1 TABLET(75 MG) BY MOUTH DAILY   Magnesium 400 MG Tabs Take 400 mg by mouth 3 (three) times daily.   methimazole  5 MG tablet Commonly known as: TAPAZOLE  Take 0.5 tablets (2.5 mg total) by mouth as directed. Half a tablet Monday through Wednesday  only ,none the rest of the week   pantoprazole  40 MG tablet Commonly known as: PROTONIX  TAKE 1 TABLET(40 MG) BY MOUTH TWICE DAILY   Restasis 0.05 % ophthalmic emulsion Generic drug: cycloSPORINE 1 drop 2 (two) times daily.   VITAMIN B-12 IJ Inject as directed every 30 (thirty) days.   zaleplon  5 MG capsule Commonly known as: SONATA  TAKE 1-2 CAPSULES BY MOUTH DAILY AS NEEDED FOR SLEEP          OBJECTIVE:   PHYSICAL EXAM: VS: BP 120/70   Pulse 66   Ht 5' 3.5 (1.613 m)   Wt 163 lb 9.6 oz (74.2 kg)   SpO2 96%   BMI 28.53 kg/m    EXAM: General: Pt appears well and is in NAD  Neck: General: Supple without adenopathy. Thyroid : Thyroid  size normal.  No goiter or nodules appreciated.   Lungs: Clear with good BS bilat   Heart: Auscultation: RRR.  Abdomen:  soft, nontender  Extremities:  BL LE: No pretibial edema   Mental Status: Judgment, insight: Intact Mood and affect: No depression, anxiety, or agitation     DATA REVIEWED:  Latest Reference Range & Units 05/30/24 14:37  TSH 0.40 - 4.50 mIU/L 2.08  Triiodothyronine,Free,Serum 2.3 - 4.2 pg/mL 3.1  T4,Free(Direct) 0.8 - 1.8 ng/dL 1.3     Results for Bethany, Cole (MRN [id number]) as of 04/09/2021 07:59  Ref. Range 12/03/2020 09:39  TRAB Latest Ref Range: <=2.00 IU/L 1.02    ASSESSMENT / PLAN / RECOMMENDATIONS:   Hyperthyroidism :    - This has been attributed to Graves' disease through a prior diagnosis. TRAB here was detectable but not elevated  at 1.02 IU/L but she already has been on methimazole  at the time  - Patient is clinically  euthyroid - No local neck symptoms - TFTs remain within normal range, will decrease methimazole  further  Medications  Change Methimazole  5 mg , HALF a tablet Monday and  Wednesday only (none the rest of the week)   2.  Graves' Disease:   - No extra thyroidal manifestations of Graves' Disease   F/U in 6 months      Signed electronically by: Stefano Redgie Butts, MD  [facility]  [facility] [address] [place] [place], [place] [address] Phone: [phone] FAX: [phone]      CC: Theophilus Andrews, Tully GRADE, MD [address] [place] [address] Phone: [phone]  Fax: [phone]   Return to Endocrinology clinic as below: Future Appointments  Date Time Provider Department Center  07/26/2024 12:40 PM Luke Chiquita SAUNDERS, DO [facility]  11/05/2024 10:45 AM [facility] N[facility] None  11/30/2024  1:00 PM Deadrian Toya, Donell Redgie, MD [facility] None

## 2024-05-31 ENCOUNTER — Ambulatory Visit: Payer: Self-pay | Admitting: Internal Medicine

## 2024-05-31 MED ORDER — METHIMAZOLE 5 MG PO TABS: 2.5000 mg | ORAL_TABLET | ORAL | 2 refills | Status: AC

## 2024-06-01 ENCOUNTER — Ambulatory Visit

## 2024-06-01 DIAGNOSIS — E538 Deficiency of other specified B group vitamins: Secondary | ICD-10-CM | POA: Diagnosis not present

## 2024-06-01 MED ORDER — CYANOCOBALAMIN 1000 MCG/ML IJ SOLN
1000.0000 ug | Freq: Once | INTRAMUSCULAR | Status: AC
Start: 2024-06-01 — End: 2024-06-01
  Administered 2024-06-01: 1000 ug via INTRAMUSCULAR

## 2024-06-01 NOTE — Progress Notes (Signed)
 Patient is in office today for a nurse visit for B12 Injection. Patient Injection was given in the  Right deltoid. Patient tolerated injection well.

## 2024-06-08 ENCOUNTER — Other Ambulatory Visit: Payer: Self-pay | Admitting: Internal Medicine

## 2024-06-08 DIAGNOSIS — E0590 Thyrotoxicosis, unspecified without thyrotoxic crisis or storm: Secondary | ICD-10-CM

## 2024-06-08 DIAGNOSIS — I10 Essential (primary) hypertension: Secondary | ICD-10-CM

## 2024-06-27 ENCOUNTER — Other Ambulatory Visit: Payer: Self-pay | Admitting: Family Medicine

## 2024-06-27 DIAGNOSIS — G8929 Other chronic pain: Secondary | ICD-10-CM

## 2024-06-27 NOTE — Telephone Encounter (Signed)
 Last OV 01/09/24 Next OV 11/05/24  Last refill 12/01/23 Qty #60/1   BMP 03/29/24  Component Ref Range & Units (hover) 2 mo ago (04/03/24) 9 mo ago (09/26/23) 1 yr ago (03/09/23) 2 yr ago (02/18/22) 2 yr ago (08/28/21) 3 yr ago (11/17/20) 3 yr ago (11/07/20)  Sodium 135 131 Low  132 Low  132 Low  135 139 R 137  Potassium 3.8 4.7 4.1 4.4 4.2 5.2 R 4.3  Chloride 100 97 97 97 100 101 R 101  CO2 29 28 28 28 30 19  Low  R 29  Glucose, Bld 79 88 90 90 104 High  84 R 90  BUN 20 13 14 16 13 12  R 15  Creatinine, Ser 0.58 0.68 0.72 0.67 0.76 0.72 R 0.73  GFR 89.18 86.14 CM 83.02 CM 87.43 CM 78.64 CM  83.00 CM  Comment: Calculated using the CKD-EPI Creatinine Equation (2021)  Calcium  9.3 9.5 9.3 9.6 9.5 9.6 R 9.5

## 2024-07-02 ENCOUNTER — Ambulatory Visit (INDEPENDENT_AMBULATORY_CARE_PROVIDER_SITE_OTHER): Admitting: *Deleted

## 2024-07-02 DIAGNOSIS — E538 Deficiency of other specified B group vitamins: Secondary | ICD-10-CM | POA: Diagnosis not present

## 2024-07-02 MED ORDER — CYANOCOBALAMIN 1000 MCG/ML IJ SOLN
1000.0000 ug | Freq: Once | INTRAMUSCULAR | Status: AC
  Administered 2024-07-02: 1000 ug via INTRAMUSCULAR

## 2024-07-02 NOTE — Progress Notes (Signed)
 Per orders of Dr. Ardyth Harps, injection of B12 given by Kern Reap. Patient tolerated injection well.

## 2024-07-08 ENCOUNTER — Other Ambulatory Visit: Payer: Self-pay | Admitting: Internal Medicine

## 2024-07-26 ENCOUNTER — Ambulatory Visit: Admitting: Family Medicine

## 2024-07-26 ENCOUNTER — Encounter: Payer: Self-pay | Admitting: Family Medicine

## 2024-07-26 DIAGNOSIS — Z0000 Encounter for general adult medical examination without abnormal findings: Secondary | ICD-10-CM | POA: Diagnosis not present

## 2024-07-26 NOTE — Patient Instructions (Addendum)
 I really enjoyed getting to talk with you today! I am available on Tuesdays and Thursdays for virtual visits if you have any questions or concerns, or if I can be of any further assistance.   CHECKLIST FROM ANNUAL WELLNESS VISIT:  -Follow up (please call to schedule if not scheduled after visit):   -yearly for annual wellness visit with primary care office  Here is a list of your preventive care/health maintenance measures and the plan for each if any are due:  PLAN For any measures below that may be due:   Health Maintenance  Topic Date Due   Hepatitis C Screening  Never done   Medicare Annual Wellness (AWV)  03/08/2024   COVID-19 Vaccine (6 - 2025-26 season) 06/25/2024   Mammogram  05/03/2025   DTaP/Tdap/Td (2 - Td or Tdap) 10/26/2027   Colonoscopy  09/09/2028   Pneumococcal Vaccine: 50+ Years  Completed   Influenza Vaccine  Completed   DEXA SCAN  Completed   Zoster Vaccines- Shingrix  Completed   HPV VACCINES  Aged Out   Meningococcal B Vaccine  Aged Out    -See a dentist at least yearly  -Get your eyes checked and then per your eye specialist's recommendations  -Other issues addressed today:   Sleep - see info below   Alcohol: limit to no more than 1 drink in any given day   Advanced directives: see below   -I have included below further information regarding a healthy whole foods based diet, physical activity guidelines for adults, stress management and opportunities for social connections. I hope you find this information useful.   -----------------------------------------------------------------------------------------------------------------------------------------------------------------------------------------------------------------------------------------------------------    NUTRITION: -eat real food: lots of colorful vegetables (half the plate) and fruits -5-7 servings of vegetables and fruits per day (fresh or steamed is best), exp. 2 servings of  vegetables with lunch and dinner and 2 servings of fruit per day. Berries and greens such as kale and collards are great choices.  -consume on a regular basis:  fresh fruits, fresh veggies, fish, nuts, seeds, healthy oils (such as olive oil, avocado oil), whole grains (make sure for bread/pasta/crackers/etc., that the first ingredient on label contains the word whole), legumes. -can eat small amounts of dairy and lean meat (no larger than the palm of your hand), but avoid processed meats such as ham, bacon, lunch meat, etc. -drink water -try to avoid fast food and pre-packaged foods, processed meat, ultra processed foods/beverages (donuts, candy, etc.) -most experts advise limiting sodium to < 2300mg  per day, should limit further is any chronic conditions such as high blood pressure, heart disease, diabetes, etc. The American Heart Association advised that < 1500mg  is is ideal -try to avoid foods/beverages that contain any ingredients with names you do not recognize  -try to avoid foods/beverages  with added sugar or sweeteners/sweets  -try to avoid sweet drinks (including diet drinks): soda, juice, Gatorade, sweet tea, power drinks, diet drinks -try to avoid white rice, white bread, pasta (unless whole grain)  EXERCISE GUIDELINES FOR ADULTS: -if you wish to increase your physical activity, do so gradually and with the approval of your doctor -STOP and seek medical care immediately if you have any chest pain, chest discomfort or trouble breathing when starting or increasing exercise  -move and stretch your body, legs, feet and arms when sitting for long periods -Physical activity guidelines for optimal health in adults: -get at least 150 minutes per week of moderate exercise (can talk, but not sing); this is about 20-30 minutes  of sustained activity 5-7 days per week or two 10-15 minute episodes of sustained activity 5-7 days per week -do some muscle building/resistance training/strength training  at least 2 days per week  -balance exercises 3+ days per week:   Stand somewhere where you have something sturdy to hold onto if you lose balance    1) lift up on toes, then back down, start with 5x per day and work up to 20x   2) stand and lift one leg straight out to the side so that foot is a few inches of the floor, start with 5x each side and work up to 20x each side   3) stand on one foot, start with 5 seconds each side and work up to 20 seconds on each side  If you need ideas or help with getting more active:  -Silver sneakers https://tools.silversneakers.com  -Walk with a Doc: http://www.duncan-williams.com/  -try to include resistance (weight lifting/strength building) and balance exercises twice per week: or the following link for ideas: http://castillo-powell.com/  BuyDucts.dk  STRESS MANAGEMENT: -can try meditating, or just sitting quietly with deep breathing while intentionally relaxing all parts of your body for 5 minutes daily -if you need further help with stress, anxiety or depression please follow up with your primary doctor or contact the wonderful folks at [facility]: [phone]  SOCIAL CONNECTIONS: -options in [place] if you wish to engage in more social and exercise related activities:  -Silver sneakers https://tools.silversneakers.com  -Walk with a Doc: http://www.duncan-williams.com/  -Check out the [facility] Active Adults 50+ section on the [employer] (hiking clubs, book clubs, cards and games, chess, exercise classes, aquatic classes and much more) - see the website for details: https://www.[place].gov/departments/parks-recreation/active-adults50  -YouTube has lots of exercise videos for different ages and abilities as well  -[facility] (a variety of indoor and outdoor inperson activities for adults). [phone]. [address]  -Virtual Online Classes (a variety of topics): see seniorplanet.org or call [phone]  -consider volunteering at a school, hospice center, church, senior center or elsewhere      FOR IMPROVED SLEEP AND TO RESET YOUR SLEEP SCHEDULE:  []  Schedule sleep counseling(cognitive behavioral therapy).   [facility] is a good option.   Call for appointment: [phone]  []  Exercise 30 minutes daily. Some people do better with exercise in the morning, other do better exercising later in the day.  []  Avoid caffeine and alcohol - particularly in the evenings. For some people even a little alcohol can impair sleep.   []  Go to bed and wake up at the same time everyday (within a 30 minute window). When you get up in the morning for your designated wake time - turn on lights and open curtains right away. This will help to set your internal sleep clock.   []  Keep bedroom cool, dark and quiet - if you have to get up at night make sure there is no white light from street lamps, night lights, lights, clock, devices etc. that enters your eyes. Instead, use red light flashlight or night lights and avoid looking directly at the light while up.   []  Set 1-2 hour bedtime routine, dim lights, avoid screens (phones, computers, TVs, etc during this time), consider sleepytime tea, warm bath or shower, avoid alcohol and caffeine  []  Reserve bed for sleep - do not read, watch TV, look at phone or device, etc., in bed.  []  If you toss and turn for more then 10-15 minutes,  get out of bed and list or journal thoughts or do quiet activity (not screen-time, no tv, phone, computer) then go back to bed. Repeat as needed. Try not to worry about when you will eventually fall asleep.  []  Some people find that a half dose of benadryl, melatonin, tylenol pm or unisom on a few nights per week is helpful initially for a few weeks.  []  Seek help for any depression or anxiety.  [] Prescription  strength sleep medications should only be used in severe cases of insomnia if other measures fail and should be used sparingly.  I hope you are feeling better soon! Follow up with your doctor in 3-4 weeks or sooner if your symptoms worsen or new concerns arise.   ADVANCED HEALTHCARE DIRECTIVES:  [place] Advanced Directives assistance:   ExpressWeek.com.cy  Everyone should have advanced health care directives in place. This is so that you get the care you want, should you ever be in a situation where you are unable to make your own medical decisions.   From the [place] Advanced Directive Website: Advance Health Care Directives are legal documents in which you give written instructions about your health care if, in the future, you cannot speak for yourself.   A health care power of attorney allows you to name a person you trust to make your health care decisions if you cannot make them yourself. A declaration of a desire for a natural death (or living will) is document, which states that you desire not to have your life prolonged by extraordinary measures if you have a terminal or incurable illness or if you are in a vegetative state. An advance instruction for mental health treatment makes a declaration of instructions, information and preferences regarding your mental health treatment. It also states that you are aware that the advance instruction authorizes a mental health treatment provider to act according to your wishes. It may also outline your consent or refusal of mental health treatment. A declaration of an anatomical gift allows anyone over the age of 53 to make a gift by will, organ donor card or other document.   Please see the following website or an elder law attorney for forms, FAQs and for completion of advanced directives:   Print production planner Health Care Directives Advance Health Care Directives ([email or web address]/)   Or copy and paste the following to your web browser: [email or web address]

## 2024-07-26 NOTE — Progress Notes (Signed)
 PATIENT CHECK-IN and HEALTH RISK ASSESSMENT QUESTIONNAIRE:  -completed by phone/video for upcoming Medicare Preventive Visit   Pre-Visit Check-in: 1)Vitals (height, wt, BP, etc) - record in vitals section for visit on day of visit Request home vitals (wt, BP, etc.) and enter into vitals, THEN update Vital Signs SmartPhrase below at the top of the HPI. See below.  2)Review and Update Medications, Allergies PMH, Surgeries, Social history in Epic 3)Hospitalizations in the last year with date/reason? none  4)Review and Update Care Team (patient's specialists) in Epic 5) Complete PHQ9 in Epic  6) Complete Fall Screening in Epic 7)Review all Health Maintenance Due and order if not done.  Medicare Wellness Patient Questionnaire:  Answer theses question about your habits: How often do you have a drink containing alcohol? 2 drinks per month How many drinks containing alcohol do you have on a typical day when you are drinking?1-2 How often do you have six or more drinks on one occasion?no Have you ever smoked?never  Quit date if applicable? N/A  How many packs a day do/did you smoke? N/A Do you use smokeless tobacco?no Do you use an illicit drugs?N/A On average, how many days per week do you engage in moderate to strenuous exercise (like a brisk walk)? 5  On average, how many minutes do you engage in exercise at this level?40, walking - has some hills Are you sexually active? Number of partners? Typical breakfast-egg, [place] bacon and toast Typical lunch-salad, tuna  Typical dinner-roast, left overs, pasta Typical snacks: almonds, protein bar  Beverages: water and coffee  Answer theses question about your everyday activities: Can you perform most household chores?yes Are you deaf or have significant trouble hearing? Yes-Wears hearing aides Do you feel that you have a problem with memory?sometimes Do you feel safe at home?yes Last dentist visit?07/25/2024 8. Do you have any difficulty  performing your everyday activities?no Are you having any difficulty walking, taking medications on your own, and or difficulty managing daily home needs?no Do you have difficulty walking or climbing stairs?no Do you have difficulty dressing or bathing?no Do you have difficulty doing errands alone such as visiting a doctor's office or shopping?no Do you currently have any difficulty preparing food and eating?no Do you currently have any difficulty using the toilet?no Do you have any difficulty managing your finances?no Do you have any difficulties with housekeeping of managing your housekeeping?no   Do you have Advanced Directives in place (Living Will, Healthcare Power or Attorney)? No, plans to do   Last eye Exam and location? 3 months   Do you currently use prescribed or non-prescribed narcotic or opioid pain medications?no  Do you have a history or close family history of breast, ovarian, tubal or peritoneal cancer or a family member with BRCA (breast cancer susceptibility 1 and 2) gene mutations? Sister had breast cancer 1.5 year ago, mother had cancer (cannot recall type)  Request home vitals (wt, BP, etc.) and enter into vitals, THEN update Vital Signs SmartPhrase below at the top of the HPI. See below.   Nurse/Assistant Credentials/time stamp: [facility]    ----------------------------------------------------------------------------------------------------------------------------------------------------------------------------------------------------------------------  Because this visit was a virtual/telehealth visit, some criteria may be missing or patient reported. Any vitals not documented were not able to be obtained and vitals that have been documented are patient reported.    MEDICARE ANNUAL PREVENTIVE VISIT WITH PROVIDER: (Welcome to Medicare, initial annual wellness or annual wellness exam)  Virtual Visit via Video Note  I connected with Bethany Cole  on 07/26/24  by a video enabled telemedicine application and verified that I am speaking with the correct person using two identifiers.  Location patient: home Location provider:work or home office Persons participating in the virtual visit: patient, provider  Concerns and/or follow up today: nothing new, doing well. Has issues with sleep. Sees sleep specialist. Uses cpap.    See HM section in Epic for other details of completed HM.    ROS: negative for report of fevers, unintentional weight loss, vision changes, vision loss, hearing loss or change, chest pain, sob, hemoptysis, melena, hematochezia, hematuria, falls, bleeding or bruising, thoughts of suicide or self harm, memory loss  Patient-completed extensive health risk assessment - reviewed and discussed with the patient: See Health Risk Assessment completed with patient prior to the visit either above or in recent phone note. This was reviewed in detailed with the patient today and appropriate recommendations, orders and referrals were placed as needed per Summary below and patient instructions.   Review of Medical History: -PMH, PSH, Family History and current specialty and care providers reviewed and updated and listed below   Patient Care Team: Theophilus Andrews, Tully GRADE, MD as PCP - General (Internal Medicine) Sheffield, Andrez SAUNDERS, PA-C (Inactive) as Physician Assistant (Dermatology)   Past Medical History:  Diagnosis Date   Allergy    Anemia    Anxiety    Arthritis    Cataracts, bilateral    Gastritis    GERD (gastroesophageal reflux disease)    Heart murmur    Hypertension    Hyperthyroidism    OSA on CPAP    Sleep apnea    Squamous cell carcinoma of skin     Past Surgical History:  Procedure Laterality Date   ABDOMINAL HYSTERECTOMY     ENDOMETRIAL ABLATION     EYE SURGERY     HERNIA REPAIR     as an infant   UPPER GASTROINTESTINAL ENDOSCOPY  11/09/2021   Has had sevearal in [place]   WRIST ARTHROSCOPY  Right     Social History   Socioeconomic History   Marital status: Divorced    Spouse name: Not on file   Number of children: Not on file   Years of education: Not on file   Highest education level: Master's degree (e.g., MA, MS, MEng, MEd, MSW, MBA)  Occupational History   Not on file  Tobacco Use   Smoking status: Never   Smokeless tobacco: Never  Vaping Use   Vaping status: Never Used  Substance and Sexual Activity   Alcohol use: Yes    Alcohol/week: 1.0 standard drink of alcohol    Types: 1 Glasses of wine per week    Comment: occasional   Drug use: Never   Sexual activity: Not Currently    Birth control/protection: None  Other Topics Concern   Not on file  Social History Narrative   Not on file   Social Drivers of Health   Financial Resource Strain: Low Risk  (07/23/2024)   Overall Financial Resource Strain (CARDIA)    Difficulty of Paying Living Expenses: Not hard at all  Food Insecurity: No Food Insecurity (07/23/2024)   Hunger Vital Sign    Worried About Running Out of Food in the Last Year: Never true    Ran Out of Food in the Last Year: Never true  Transportation Needs: No Transportation Needs (07/23/2024)   PRAPARE - Administrator, Civil Service (Medical): No    Lack of Transportation (Non-Medical): No  Physical Activity: Sufficiently  Active (07/23/2024)   Exercise Vital Sign    Days of Exercise per Week: 5 days    Minutes of Exercise per Session: 40 min  Stress: Stress Concern Present (07/23/2024)   [employer] of Occupational Health - Occupational Stress Questionnaire    Feeling of Stress: To some extent  Social Connections: Moderately Isolated (07/23/2024)   Social Connection and Isolation Panel    Frequency of Communication with Friends and Family: Twice a week    Frequency of Social Gatherings with Friends and Family: Twice a week    Attends Religious Services: Patient declined    Database administrator or Organizations: Yes     Attends Engineer, structural: More than 4 times per year    Marital Status: Divorced  Intimate Partner Violence: Not At Risk (07/26/2024)   Humiliation, Afraid, Rape, and Kick questionnaire    Fear of Current or Ex-Partner: No    Emotionally Abused: No    Physically Abused: No    Sexually Abused: No    Family History  Problem Relation Age of Onset   Ovarian cancer Mother    Stomach cancer Mother        mets from ovarian cancer   Arthritis Mother    Cancer Mother    Hearing loss Mother    Hypertension Mother    Heart disease Father    Cancer - Other Father        laryngeal cancer   Alcohol abuse Father    Arthritis Father    Hyperlipidemia Father    Hypertension Father    Hyperlipidemia Brother    Colon cancer Neg Hx    Esophageal cancer Neg Hx    Pancreatic cancer Neg Hx    Liver disease Neg Hx    Colon polyps Neg Hx    Rectal cancer Neg Hx     Current Outpatient Medications on File Prior to Visit  Medication Sig Dispense Refill   acetaminophen (TYLENOL) 650 MG CR tablet Take 650 mg by mouth at bedtime as needed for pain.     atorvastatin  (LIPITOR) 20 MG tablet TAKE 1 TABLET(20 MG) BY MOUTH DAILY 90 tablet 0   celecoxib  (CELEBREX ) 100 MG capsule TAKE 1 CAPSULE(100 MG) BY MOUTH TWICE DAILY AS NEEDED FOR MODERATE PAIN 180 capsule 0   Cyanocobalamin  (VITAMIN B-12 IJ) Inject as directed every 30 (thirty) days.     denosumab  (PROLIA ) 60 MG/ML SOSY injection Inject 60 mg into the skin every 6 (six) months. Took in December 2022.     diclofenac Sodium (VOLTAREN) 1 % GEL Apply topically 4 (four) times daily.     dicyclomine  (BENTYL ) 10 MG capsule Take 1 capsule (10 mg total) by mouth every 8 (eight) hours as needed (abdominal cramping/diarrhea). 45 capsule 1   fluticasone  (FLONASE ) 50 MCG/ACT nasal spray Place 1 spray into both nostrils 2 (two) times daily. 16 g 0   irbesartan  (AVAPRO ) 75 MG tablet TAKE 1 TABLET(75 MG) BY MOUTH DAILY 90 tablet 0   Magnesium 400 MG TABS  Take 400 mg by mouth 3 (three) times daily.     methimazole  (TAPAZOLE ) 5 MG tablet Take 0.5 tablets (2.5 mg total) by mouth as directed. Half a tablet Mondays and Wednesdays  only ,none the rest of the week 13 tablet 2   pantoprazole  (PROTONIX ) 40 MG tablet TAKE 1 TABLET(40 MG) BY MOUTH TWICE DAILY 180 tablet 1   RESTASIS 0.05 % ophthalmic emulsion 1 drop 2 (two) times daily.  zaleplon  (SONATA ) 5 MG capsule TAKE 1-2 CAPSULES BY MOUTH DAILY AS NEEDED FOR SLEEP 30 capsule 5   No current facility-administered medications on file prior to visit.    Allergies  Allergen Reactions   Doxycycline  Nausea And Vomiting   Erythromycin    Macrobid [Nitrofurantoin]    Penicillins    Sulfa Antibiotics      Physical Exam Vitals requested from patient and listed below if patient had equipment and was able to obtain at home for this virtual visit: There were no vitals filed for this visit. Estimated body mass index is 28.53 kg/m as calculated from the following:   Height as of 05/30/24: 5' 3.5 (1.613 m).   Weight as of 05/30/24: 163 lb 9.6 oz (74.2 kg).  EKG (optional): deferred due to virtual visit  GENERAL: alert, oriented, no acute distress detected, full vision exam deferred due to pandemic and/or virtual encounter  HEENT: atraumatic, conjunttiva clear, no obvious abnormalities on inspection of external nose and ears  NECK: normal movements of the head and neck  LUNGS: on inspection no signs of respiratory distress, breathing rate appears normal, no obvious gross SOB, gasping or wheezing  CV: no obvious cyanosis  MS: moves all visible extremities without noticeable abnormality  PSYCH/NEURO: pleasant and cooperative, no obvious depression or anxiety, speech and thought processing grossly intact, Cognitive function grossly intact  Flowsheet Row Office Visit from 03/09/2023 in [facility]  PHQ-9 Total Score 0        09/12/2023    9:41 AM 03/09/2023     8:33 AM 03/08/2023    4:40 PM 09/21/2022    1:37 PM 02/18/2022    1:08 PM  Depression screen PHQ 2/9  Decreased Interest 0 0 0 1 0  Down, Depressed, Hopeless 0 0 0 0 1  PHQ - 2 Score 0 0 0 1 1  Altered sleeping  0  2 3  Tired, decreased energy  0  0 3  Change in appetite  0  0 1  Feeling bad or failure about yourself   0  0 0  Trouble concentrating  0  0 0  Moving slowly or fidgety/restless  0  0 0  Suicidal thoughts  0  0 0  PHQ-9 Score  0  3 8  Difficult doing work/chores  Not difficult at all  Not difficult at all Very difficult       02/18/2022    1:09 PM 09/21/2022    1:38 PM 03/08/2023    4:44 PM 09/12/2023    9:41 AM 07/23/2024    5:24 PM  Fall Risk  Falls in the past year? 0 0 0 0 0  Was there an injury with Fall? 0 0 0 0 0  Fall Risk Category Calculator 0 0 0 0 0   Fall Risk Category (Retired) Low  Low      (RETIRED) Patient Fall Risk Level Low fall risk  Low fall risk      Patient at Risk for Falls Due to No Fall Risks No Fall Risks     Fall risk Follow up Falls evaluation completed  Falls evaluation completed  Falls evaluation completed Falls evaluation completed      Patient-reported   Data saved with a previous flowsheet row definition     SUMMARY AND PLAN:  Medicare annual wellness visit, subsequent  Discussed applicable health maintenance/preventive health measures and advised and referred or ordered per patient preferences: -updated covid vaccine she had  done at [facility] - she was able to pull up on her phone -discussed hep c screening Health Maintenance  Topic Date Due   Hepatitis C Screening  Never done   COVID-19 Vaccine (7 - Moderna risk 2024-25 season) 01/09/2025   Mammogram  05/03/2025   Medicare Annual Wellness (AWV)  07/26/2025   DTaP/Tdap/Td (2 - Td or Tdap) 10/26/2027   Colonoscopy  09/09/2028   Pneumococcal Vaccine: 50+ Years  Completed   Influenza Vaccine  Completed   DEXA SCAN  Completed   Zoster Vaccines- Shingrix  Completed   HPV  VACCINES  Aged Out   Meningococcal B Vaccine  Aged Out     Education and counseling on the following was provided based on the above review of health and a plan/checklist for the patient, along with additional information discussed, was provided for the patient in the patient instructions :  -Advised on importance of completing advanced directives, discussed options for completing and provided information in patient instructions as well -Advised and counseled on a healthy lifestyle -counseled on sleep at length, icbt provided, discussed light triggers for circ clock, sleep goals, sleep hygeiene, sleep meds -Reviewed patient's current diet. Advised and counseled on a whole foods based healthy diet. A summary of a healthy diet was provided in the Patient Instructions. She has some limitations due to GU issues.  -reviewed patient's current physical activity level and discussed exercise guidelines for adults.Congratulated on healthy habits.  Discussed community resources and ideas for safe exercise at home to assist in meeting exercise guideline recommendations in a safe and healthy way.  -Advise yearly dental visits at minimum and regular eye exams -Advised and counseled on alcohol safe limits, risks Follow up: see patient instructions     Patient Instructions  I really enjoyed getting to talk with you today! I am available on Tuesdays and Thursdays for virtual visits if you have any questions or concerns, or if I can be of any further assistance.   CHECKLIST FROM ANNUAL WELLNESS VISIT:  -Follow up (please call to schedule if not scheduled after visit):   -yearly for annual wellness visit with primary care office  Here is a list of your preventive care/health maintenance measures and the plan for each if any are due:  PLAN For any measures below that may be due:   Health Maintenance  Topic Date Due   Hepatitis C Screening  Never done   Medicare Annual Wellness (AWV)  03/08/2024    COVID-19 Vaccine (6 - 2025-26 season) 06/25/2024   Mammogram  05/03/2025   DTaP/Tdap/Td (2 - Td or Tdap) 10/26/2027   Colonoscopy  09/09/2028   Pneumococcal Vaccine: 50+ Years  Completed   Influenza Vaccine  Completed   DEXA SCAN  Completed   Zoster Vaccines- Shingrix  Completed   HPV VACCINES  Aged Out   Meningococcal B Vaccine  Aged Out    -See a dentist at least yearly  -Get your eyes checked and then per your eye specialist's recommendations  -Other issues addressed today:   Sleep - see info below   Alcohol: limit to no more than 1 drink in any given day   Advanced directives: see below   -I have included below further information regarding a healthy whole foods based diet, physical activity guidelines for adults, stress management and opportunities for social connections. I hope you find this information useful.   -----------------------------------------------------------------------------------------------------------------------------------------------------------------------------------------------------------------------------------------------------------    NUTRITION: -eat real food: lots of colorful vegetables (half the plate) and fruits -5-7  servings of vegetables and fruits per day (fresh or steamed is best), exp. 2 servings of vegetables with lunch and dinner and 2 servings of fruit per day. Berries and greens such as kale and collards are great choices.  -consume on a regular basis:  fresh fruits, fresh veggies, fish, nuts, seeds, healthy oils (such as olive oil, avocado oil), whole grains (make sure for bread/pasta/crackers/etc., that the first ingredient on label contains the word whole), legumes. -can eat small amounts of dairy and lean meat (no larger than the palm of your hand), but avoid processed meats such as ham, bacon, lunch meat, etc. -drink water -try to avoid fast food and pre-packaged foods, processed meat, ultra processed foods/beverages (donuts,  candy, etc.) -most experts advise limiting sodium to < 2300mg  per day, should limit further is any chronic conditions such as high blood pressure, heart disease, diabetes, etc. The American Heart Association advised that < 1500mg  is is ideal -try to avoid foods/beverages that contain any ingredients with names you do not recognize  -try to avoid foods/beverages  with added sugar or sweeteners/sweets  -try to avoid sweet drinks (including diet drinks): soda, juice, Gatorade, sweet tea, power drinks, diet drinks -try to avoid white rice, white bread, pasta (unless whole grain)  EXERCISE GUIDELINES FOR ADULTS: -if you wish to increase your physical activity, do so gradually and with the approval of your doctor -STOP and seek medical care immediately if you have any chest pain, chest discomfort or trouble breathing when starting or increasing exercise  -move and stretch your body, legs, feet and arms when sitting for long periods -Physical activity guidelines for optimal health in adults: -get at least 150 minutes per week of moderate exercise (can talk, but not sing); this is about 20-30 minutes of sustained activity 5-7 days per week or two 10-15 minute episodes of sustained activity 5-7 days per week -do some muscle building/resistance training/strength training at least 2 days per week  -balance exercises 3+ days per week:   Stand somewhere where you have something sturdy to hold onto if you lose balance    1) lift up on toes, then back down, start with 5x per day and work up to 20x   2) stand and lift one leg straight out to the side so that foot is a few inches of the floor, start with 5x each side and work up to 20x each side   3) stand on one foot, start with 5 seconds each side and work up to 20 seconds on each side  If you need ideas or help with getting more active:  -Silver sneakers https://tools.silversneakers.com  -Walk with a Doc: http://www.duncan-williams.com/  -try to include  resistance (weight lifting/strength building) and balance exercises twice per week: or the following link for ideas: http://castillo-powell.com/  BuyDucts.dk  STRESS MANAGEMENT: -can try meditating, or just sitting quietly with deep breathing while intentionally relaxing all parts of your body for 5 minutes daily -if you need further help with stress, anxiety or depression please follow up with your primary doctor or contact the wonderful folks at [facility]: [phone]  SOCIAL CONNECTIONS: -options in [place] if you wish to engage in more social and exercise related activities:  -Silver sneakers https://tools.silversneakers.com  -Walk with a Doc: http://www.duncan-williams.com/  -Check out the [facility] Active Adults 50+ section on the [place] [employer] (hiking clubs, book clubs, cards and games, chess, exercise classes, aquatic classes and much more) - see the website for details: https://www.[place].gov/departments/parks-recreation/active-adults50  -YouTube  has lots of exercise videos for different ages and abilities as well  -[facility] (a variety of indoor and outdoor inperson activities for adults). [phone]. [address]  -Virtual Online Classes (a variety of topics): see seniorplanet.org or call [phone]  -consider volunteering at a school, hospice center, church, senior center or elsewhere      FOR IMPROVED SLEEP AND TO RESET YOUR SLEEP SCHEDULE:  []  Schedule sleep counseling(cognitive behavioral therapy).   [facility] is a good option.   Call for appointment: [phone]  []  Exercise 30 minutes daily. Some people do better with exercise in the morning, other do better exercising later in the day.  []  Avoid caffeine and alcohol - particularly in the evenings. For some people even a little alcohol can  impair sleep.   []  Go to bed and wake up at the same time everyday (within a 30 minute window). When you get up in the morning for your designated wake time - turn on lights and open curtains right away. This will help to set your internal sleep clock.   []  Keep bedroom cool, dark and quiet - if you have to get up at night make sure there is no white light from street lamps, night lights, lights, clock, devices etc. that enters your eyes. Instead, use red light flashlight or night lights and avoid looking directly at the light while up.   []  Set 1-2 hour bedtime routine, dim lights, avoid screens (phones, computers, TVs, etc during this time), consider sleepytime tea, warm bath or shower, avoid alcohol and caffeine  []  Reserve bed for sleep - do not read, watch TV, look at phone or device, etc., in bed.  []  If you toss and turn for more then 10-15 minutes, get out of bed and list or journal thoughts or do quiet activity (not screen-time, no tv, phone, computer) then go back to bed. Repeat as needed. Try not to worry about when you will eventually fall asleep.  []  Some people find that a half dose of benadryl, melatonin, tylenol pm or unisom on a few nights per week is helpful initially for a few weeks.  []  Seek help for any depression or anxiety.  [] Prescription strength sleep medications should only be used in severe cases of insomnia if other measures fail and should be used sparingly.  I hope you are feeling better soon! Follow up with your doctor in 3-4 weeks or sooner if your symptoms worsen or new concerns arise.   ADVANCED HEALTHCARE DIRECTIVES:  [place] Advanced Directives assistance:   ExpressWeek.com.cy  Everyone should have advanced health care directives in place. This is so that you get the care you want, should you ever be in a situation where you are unable to make your own medical decisions.   From the [place] Advanced  Directive Website: Advance Health Care Directives are legal documents in which you give written instructions about your health care if, in the future, you cannot speak for yourself.   A health care power of attorney allows you to name a person you trust to make your health care decisions if you cannot make them yourself. A declaration of a desire for a natural death (or living will) is document, which states that you desire not to have your life prolonged by extraordinary measures if you have a terminal or incurable illness or if you are in a vegetative state. An advance instruction for mental health treatment makes a declaration of instructions, information and  preferences regarding your mental health treatment. It also states that you are aware that the advance instruction authorizes a mental health treatment provider to act according to your wishes. It may also outline your consent or refusal of mental health treatment. A declaration of an anatomical gift allows anyone over the age of 20 to make a gift by will, organ donor card or other document.   Please see the following website or an elder law attorney for forms, FAQs and for completion of advanced directives: [place]  Print production planner Health Care Directives Advance Health Care Directives ([email or web address]  Or copy and paste the following to your web browser: [email or web address]        Chiquita JONELLE Cramp, DO

## 2024-07-26 NOTE — Progress Notes (Signed)
 Patient unable to perform vital signs at home and declined PHQ-9.

## 2024-09-03 ENCOUNTER — Ambulatory Visit (INDEPENDENT_AMBULATORY_CARE_PROVIDER_SITE_OTHER): Admitting: *Deleted

## 2024-09-03 DIAGNOSIS — E538 Deficiency of other specified B group vitamins: Secondary | ICD-10-CM

## 2024-09-03 MED ORDER — CYANOCOBALAMIN 1000 MCG/ML IJ SOLN
1000.0000 ug | Freq: Once | INTRAMUSCULAR | Status: AC
  Administered 2024-09-03: 1000 ug via INTRAMUSCULAR

## 2024-09-03 NOTE — Progress Notes (Signed)
 Per orders of Dr. Ardyth Harps, injection of B12 given by Kern Reap. Patient tolerated injection well.

## 2024-09-05 NOTE — Progress Notes (Signed)
 HPI F never smoker followed for OSA, complicated by HTN,Hypercholesterolemia, Hyperthyroid,  HST 04/29/21- AHI/ 17.8/ hr, desaturation to 88%, body weight 163 lbs  ========================================================================    11/12-24- 72 yoF never smoker followed for OSA, Insomnia, complicated by HTN,Hypercholesterolemia, Hyperthyroid,  CPAP auto 5-15/ [facility] Download-compliance 90%, AHI 0.8/hr  Body weight today-150 lbs      note weight loss -Sonata  5 mg,  PCP treated acute sinusitis with cefuroxime  x 10 days/ 08/24/23 Download reviewed- CPAP ok. Sonata  helps at night and when using it, she also tends to nap in afternoon. This is new- maybe because she is more relaxed. Doesn't seem like drug carryover for this short half-life med.  09/06/24- - 73 yoF never smoker followed for OSA, Insomnia, complicated by HTN,Hypercholesterolemia, Hyperthyroid,  CPAP auto 5-15/ [facility] Download-compliance-93%, AHI 0.6/hr  Body weight today-170 lbs -Sonata  5 mg,  Discussed the use of AI scribe software for clinical note transcription with the patient, who gave verbal consent to proceed.  History of Present Illness   The patient presents for follow-up of Insomnia and sleep apnea with CPAP. She benefits from CPAP with better sleep and has no snoring.   She recently received a flu shot without any issues. They are interested in obtaining an RSV vaccine as a precautionary measure. She can get it at drug store.   Her insomnia has improved with Sonata  5 mg at bedtime, but she is still having trouble with waking around 3AM, unable to return to sleep. We discussed options and she is going to try a second sonata  on waking during night.     Assessment and Plan:    Obstructive sleep apnea Continue CPAP 5-15   Insomnia-primary Try taking second 5 mg Sonata  on waking around 3Am as discussed   Respiratory Syncytial Virus (RSV) vaccination They expressed interest in RSV vaccination. Vaccine  well tolerated, no reported adverse effects. - Recommended RSV vaccination for additional protection.      ROS-see HPI   + = positive Constitutional:    weight loss, night sweats, fevers, chills, fatigue, lassitude. HEENT:    headaches, difficulty swallowing, tooth/dental problems, sore throat,       sneezing, itching, ear ache, +nasal congestion, post nasal drip, snoring CV:    chest pain, orthopnea, PND, swelling in lower extremities, anasarca,                                   dizziness, palpitations Resp:   shortness of breath with exertion or at rest.                productive cough,   non-productive cough, coughing up of blood.              change in color of mucus.  wheezing.   Skin:    rash or lesions. GI:  No-   heartburn, indigestion, abdominal pain, nausea, vomiting, diarrhea,                 change in bowel habits, loss of appetite GU: dysuria, change in color of urine, no urgency or frequency.   flank pain. MS:   joint pain, stiffness, decreased range of motion, back pain. Neuro-     nothing unusual Psych:  change in mood or affect.  depression or anxiety.   memory loss.  OBJ- Physical Exam General- Alert, Oriented, Affect-appropriate, Distress- none acute,  Skin- rash-none, lesions- none, excoriation- none Lymphadenopathy- none Head-  atraumatic            Eyes- +strabismus            Ears- Hearing, canals-normal            Nose- Clear, no-Septal dev, mucus, polyps, erosion, perforation             Throat- Mallampati II , mucosa clear , drainage- none, tonsils- atrophic, + teeth Neck- flexible , trachea midline, no stridor , thyroid  nl, carotid no bruit Chest - symmetrical excursion , unlabored           Heart/CV- RRR , no murmur , no gallop  , no rub, nl s1 s2                           - JVD- none , edema- none, stasis changes- none, varices- none           Lung- clear to P&A, wheeze- none, cough- none , dullness-none, rub- none           Chest wall-  Abd-  Br/ Gen/  Rectal- Not done, not indicated Extrem- cyanosis- none, clubbing, none, atrophy- none, strength- nl Neuro- grossly intact to observation

## 2024-09-06 ENCOUNTER — Ambulatory Visit: Admitting: Internal Medicine

## 2024-09-06 ENCOUNTER — Encounter: Payer: Self-pay | Admitting: Internal Medicine

## 2024-09-06 VITALS — BP 138/84 | HR 68 | Temp 98.2°F | Ht 63.5 in | Wt 170.6 lb

## 2024-09-06 DIAGNOSIS — G4733 Obstructive sleep apnea (adult) (pediatric): Secondary | ICD-10-CM | POA: Diagnosis not present

## 2024-09-06 DIAGNOSIS — F5101 Primary insomnia: Secondary | ICD-10-CM | POA: Diagnosis not present

## 2024-09-06 NOTE — Patient Instructions (Signed)
 We can continue CPAP auto 5-15, with mask of choice, humidifier, supplies, AirView/ card  Please call if we can help.  Ask the check-out desk to bring you back with a sleep doctor  Try taking a Sonata  cap at bedtime, and repeat if you wake during the night 3 or 4 hours later.  You can ask [employer] home care company billing office about insurance they find easy to work with.

## 2024-09-10 ENCOUNTER — Telehealth: Payer: Self-pay | Admitting: Internal Medicine

## 2024-09-10 NOTE — Telephone Encounter (Unsigned)
 Copied from CRM [id number]. Topic: Clinical - Medication Refill >> Sep 10, 2024  3:47 PM Essie A wrote: Medication: zaleplon  (SONATA ) 5 MG capsule, this needs to be 2 times per night, 60 capsules instead of 1-2 times, 30 capsules   Has the patient contacted their pharmacy? No, because the prescription needs to be changed (Agent: If no, request that the patient contact the pharmacy for the refill. If patient does not wish to contact the pharmacy document the reason why and proceed with request.) (Agent: If yes, when and what did the pharmacy advise?)  This is the patient's preferred pharmacy:  [id number] [place], [place] - [address] AT [facility] [address] [facility] [address] & [facility] [address] [place] [address] Phone: [phone] Fax: [phone]  Is this the correct pharmacy for this prescription? Yes If no, delete pharmacy and type the correct one.   Has the prescription been filled recently? Yes  Is the patient out of the medication? No  Has the patient been seen for an appointment in the last year OR does the patient have an upcoming appointment? Yes  Can we respond through MyChart? Yes  Agent: Please be advised that Rx refills may take up to 3 business days. We ask that you follow-up with your pharmacy.

## 2024-09-11 MED ORDER — ZALEPLON 5 MG PO CAPS: ORAL_CAPSULE | ORAL | 5 refills | Status: DC

## 2024-09-11 NOTE — Telephone Encounter (Signed)
Sonata refilled ?

## 2024-09-17 ENCOUNTER — Ambulatory Visit: Payer: Self-pay

## 2024-09-17 NOTE — Telephone Encounter (Signed)
 noted

## 2024-09-17 NOTE — Telephone Encounter (Signed)
 FYI Only or Action Required?: FYI only for provider: appointment scheduled on 09/18/24.  Patient was last seen in primary care on 07/26/2024 by Luke Chiquita SAUNDERS, DO.  Called Nurse Triage reporting Headache and Nasal Congestion (/).  Symptoms began several weeks ago.  Interventions attempted: OTC medications: Saline rinse.  Symptoms are: unchanged.  Triage Disposition: See HCP Within 4 Hours (Or PCP Triage)  Patient/caregiver understands and will follow disposition?: No  Copied from CRM #[id number]. Topic: Clinical - Red Word Triage >> Sep 17, 2024 12:14 PM Larissa S wrote: [facility] that prompted transfer to Nurse Triage: nasal drainage, facial swelling, headaches- 2 weeks Reason for Disposition  [1] Redness or swelling on the cheek, forehead or around the eye AND [2] no fever  Answer Assessment - Initial Assessment Questions Pt reports onset sinus congestion and headache 2 weeks ago with 3-4/10 headache and sinus pain. Denies fever. White-ish drainage from sinuses. Mild swelling along right bridge of nose and around eye. Reports similar symptoms in the past requiring abxs. Offered appt in office today, pt declines stating she is unable to make to an OV today. Scheduled for soonest appt tomorrow morning. Advised UC or ED for worsening symptoms.   1. LOCATION: Where does it hurt?      Sinuses and head  2. ONSET: When did the sinus pain start?  (e.g., hours, days)      2 weeks ago  3. SEVERITY: How bad is the pain?   (Scale 0-10; or none, mild, moderate or severe)     Headache 4/10, sinuses 3/10  4. RECURRENT SYMPTOM: Have you ever had sinus problems before? If Yes, ask: When was the last time? and What happened that time?      Yes, normally has to take abxs  5. NASAL CONGESTION: Is the nose blocked? If Yes, ask: Can you open it or must you breathe through your mouth?     Denies  6. NASAL DISCHARGE: Do you have discharge from your nose? If so ask, What color?      White-ish  7. FEVER: Do you have a fever? If Yes, ask: What is it, how was it measured, and when did it start?      Denies  8. OTHER SYMPTOMS: Do you have any other symptoms? (e.g., sore throat, cough, earache, difficulty breathing)     Headache  Protocols used: Sinus Pain or Congestion-A-AH

## 2024-09-18 ENCOUNTER — Other Ambulatory Visit: Payer: Self-pay

## 2024-09-18 ENCOUNTER — Encounter: Payer: Self-pay | Admitting: Family Medicine

## 2024-09-18 ENCOUNTER — Ambulatory Visit (INDEPENDENT_AMBULATORY_CARE_PROVIDER_SITE_OTHER): Admitting: Family Medicine

## 2024-09-18 ENCOUNTER — Encounter (HOSPITAL_BASED_OUTPATIENT_CLINIC_OR_DEPARTMENT_OTHER): Payer: Self-pay | Admitting: Emergency Medicine

## 2024-09-18 ENCOUNTER — Ambulatory Visit: Payer: Self-pay

## 2024-09-18 ENCOUNTER — Emergency Department (HOSPITAL_BASED_OUTPATIENT_CLINIC_OR_DEPARTMENT_OTHER)
Admission: EM | Admit: 2024-09-18 | Discharge: 2024-09-18 | Disposition: A | Discharge status: Home / Self Care | Attending: Emergency Medicine | Admitting: Emergency Medicine

## 2024-09-18 VITALS — BP 130/78 | HR 59 | Temp 98.1°F | Wt 170.0 lb

## 2024-09-18 DIAGNOSIS — R21 Rash and other nonspecific skin eruption: Secondary | ICD-10-CM | POA: Diagnosis present

## 2024-09-18 DIAGNOSIS — E039 Hypothyroidism, unspecified: Secondary | ICD-10-CM | POA: Diagnosis not present

## 2024-09-18 DIAGNOSIS — T50905A Adverse effect of unspecified drugs, medicaments and biological substances, initial encounter: Secondary | ICD-10-CM

## 2024-09-18 DIAGNOSIS — R059 Cough, unspecified: Secondary | ICD-10-CM

## 2024-09-18 DIAGNOSIS — J019 Acute sinusitis, unspecified: Secondary | ICD-10-CM | POA: Diagnosis not present

## 2024-09-18 DIAGNOSIS — T361X5A Adverse effect of cephalosporins and other beta-lactam antibiotics, initial encounter: Secondary | ICD-10-CM | POA: Insufficient documentation

## 2024-09-18 DIAGNOSIS — L500 Allergic urticaria: Secondary | ICD-10-CM | POA: Diagnosis not present

## 2024-09-18 DIAGNOSIS — I10 Essential (primary) hypertension: Secondary | ICD-10-CM | POA: Diagnosis not present

## 2024-09-18 DIAGNOSIS — Z79899 Other long term (current) drug therapy: Secondary | ICD-10-CM | POA: Insufficient documentation

## 2024-09-18 LAB — POC COVID19 BINAXNOW: SARS Coronavirus 2 Ag: NEGATIVE

## 2024-09-18 LAB — POCT INFLUENZA A/B
Influenza A, POC: NEGATIVE
Influenza B, POC: NEGATIVE

## 2024-09-18 MED ORDER — METHYLPREDNISOLONE SODIUM SUCC 125 MG IJ SOLR
125.0000 mg | Freq: Once | INTRAMUSCULAR | Status: AC
  Administered 2024-09-18: 125 mg via INTRAVENOUS
  Filled 2024-09-18: qty 2

## 2024-09-18 MED ORDER — CEFUROXIME AXETIL 500 MG PO TABS: 500.0000 mg | ORAL_TABLET | Freq: Two times a day (BID) | ORAL | 0 refills | Status: AC

## 2024-09-18 MED ORDER — DIPHENHYDRAMINE HCL 50 MG/ML IJ SOLN
25.0000 mg | Freq: Once | INTRAMUSCULAR | Status: AC
  Administered 2024-09-18: 25 mg via INTRAVENOUS
  Filled 2024-09-18: qty 1

## 2024-09-18 MED ORDER — FAMOTIDINE IN NACL 20-0.9 MG/50ML-% IV SOLN
20.0000 mg | Freq: Once | INTRAVENOUS | Status: AC
  Administered 2024-09-18: 20 mg via INTRAVENOUS
  Filled 2024-09-18: qty 50

## 2024-09-18 NOTE — Discharge Instructions (Signed)
 It was a pleasure taking care of you today. You were seen in the Emergency Department for an allergic reaction to a medication. Your work-up was reassuring. You were given a dose of Benadryl , Pepcid  and a steroid called Solu-Medrol  to counteract the allergic reaction.  This medication has been added to your list of allergies. Refer to the attached documentation for further management of your symptoms.  As discussed, your PCP will be managing the continued treatment for your acute bacterial sinusitis.  Please reach out to them if you have any additional questions about the medication that you are being prescribed.  Please return to the ER if you experience chest pain, trouble breathing, intractable nausea/vomiting or any other life threatening illnesses.

## 2024-09-18 NOTE — ED Notes (Signed)
 Patient is getting undressed and placing gown on.

## 2024-09-18 NOTE — ED Notes (Signed)
 ED Provider at bedside.

## 2024-09-18 NOTE — ED Notes (Signed)
 [place]

## 2024-09-18 NOTE — Telephone Encounter (Signed)
 Patient requesting different antibiotic, please add Ceftin  to allergy list  FYI Only or Action Required?: FYI only for provider: ED advised.  Patient was last seen in primary care on 09/18/2024 by Johnny Garnette LABOR, MD.  Called Nurse Triage reporting Allergic Reaction.  Symptoms began today.  Interventions attempted: Nothing.  Symptoms are: facial/oral symptoms improved, but new onset of abdominal/back hives during call.  Triage Disposition: Go to ED Now (Notify PCP)  Patient/caregiver understands and will follow disposition?: YesReason for Disposition  [1] Widespread hives AND [2] onset < 2 hours of exposure to 1st dose of drug  Answer Assessment - Initial Assessment Questions 1. APPEARANCE of RASH: What does the rash look like? (e.g., spots, blisters, raised areas, skin peeling, scaly)    Redness to face-subsided before speaking with patient 2. SIZE: How big are the spots? (e.g., tip of pen, eraser, coin; inches, centimeters)     Hives to abdomen and back present during call 3. LOCATION: Where is the rash located?     Abdomen and back 4. COLOR: What color is the rash? (Note: It is difficult to assess rash color in people with darker-colored skin. When this situation occurs, simply ask the caller to describe what they see.)     red 5. ONSET: When did the rash begin?     Patient took 1st dose of cefuroxime  about an hour ago and rash began within the hour after taking 6. FEVER: Do you have a fever? If Yes, ask: What is your temperature, how was it measured, and when did it start?     Unsure, but doesn't think so 7. ITCHING: Does the rash itch? If Yes, ask: How bad is the itch? (Scale 1-10; or mild, moderate, severe)     Yes 8. CAUSE: What do you think is causing the rash?     Allergic reaction  9. NEW MEDICINES: What new medicines are you taking? (e.g., name of antibiotic) When did you start taking this medication?.     Cefuroxime  10. OTHER SYMPTOMS: Do you  have any other symptoms? (e.g., sore throat, fever, joint pain)       Swelling and itching in both hands that have subsided during time of call, states she felt itching on sides of the tongue that has also resolved prior to call, heartburn currently present 11. PREGNANCY: Is there any chance you are pregnant? When was your last menstrual period?       NA  Protocols used: Rash - Widespread On Drugs-A-AH  Copied from CRM #[id number]. Topic: Clinical - Red Word Triage >> Sep 18, 2024  1:41 PM Aleatha C wrote: Red Word that prompted transfer to Nurse Triage: Was prescribe  cefUROXime  (CEFTIN ) 500 MG tablet from Dr Johnny today and is a having a allergic reaction to it

## 2024-09-18 NOTE — Telephone Encounter (Signed)
 This RN contacted pt to confirm going to ED and/or advise EMS if symptoms have worsened since Triage, pt stated she is in a [employer] and almost at the hospital.

## 2024-09-18 NOTE — ED Notes (Signed)
 Reviewed discharge instructions and follow-up care with pt. Pt verbalized understanding and had no further questions. Pt exited ED without complications.

## 2024-09-18 NOTE — ED Provider Notes (Signed)
 [facility]ENC[facility] DEPARTMENT AT [facility] Provider Note   CSN: [id number] Arrival date & time: 09/18/24  1419     Patient presents with: Allergic Reaction   Bethany Cole is a 73 y.o. female with past medical history of hypothyroid, hypertension, GERD, gastritis, hyperlipidemia, who presents emergency department for evaluation of bilateral hand swelling and rash.  Patient reports that she was recently diagnosed with an acute bacterial sinusitis and was seen by her primary provider this morning.  Her primary provider gave her cefuroxime  to treat the sinusitis.  Patient's provider stated this patient had had this medication 1 year ago but the patient did not recall this.  Approximately 15 minutes after taking the medication this morning, patient began to notice bilateral swelling of her wrist and fingers.  An hour after that, patient reported a widespread rash.  Both her hands and the rash were itching.  She did not report any tongue swelling or airway compromise.  She denies any chest pain or shortness of breath.  However, she did call the on call number and was told to come to the emergency department for further evaluation.  Patient did not take any medication PTA.   Allergic Reaction Presenting symptoms: rash        Prior to Admission medications   Medication Sig Start Date End Date Taking? Authorizing Provider  acetaminophen (TYLENOL) 650 MG CR tablet Take 650 mg by mouth at bedtime as needed for pain.    [provider]  atorvastatin  (LIPITOR) 20 MG tablet TAKE 1 TABLET(20 MG) BY MOUTH DAILY 07/10/24   Theophilus Andrews, Tully GRADE, MD  cefUROXime  (CEFTIN ) 500 MG tablet Take 1 tablet (500 mg total) by mouth 2 (two) times daily with a meal for 10 days. 09/18/24 09/28/24  Johnny Garnette LABOR, MD  celecoxib  (CELEBREX ) 100 MG capsule TAKE 1 CAPSULE(100 MG) BY MOUTH TWICE DAILY AS NEEDED FOR MODERATE PAIN 06/27/24   Corey, Evan S, MD  Cyanocobalamin  (VITAMIN B-12 IJ) Inject as  directed every 30 (thirty) days.    [provider]  denosumab  (PROLIA ) 60 MG/ML SOSY injection Inject 60 mg into the skin every 6 (six) months. Took in December 2022.    [provider]  diclofenac Sodium (VOLTAREN) 1 % GEL Apply topically 4 (four) times daily.    [provider]  dicyclomine  (BENTYL ) 10 MG capsule Take 1 capsule (10 mg total) by mouth every 8 (eight) hours as needed (abdominal cramping/diarrhea). 12/09/22   Legrand Victory LITTIE DOUGLAS, MD  fluticasone  (FLONASE ) 50 MCG/ACT nasal spray Place 1 spray into both nostrils 2 (two) times daily. 05/04/21 09/18/24  Jordan, Betty G, MD  irbesartan  (AVAPRO ) 75 MG tablet TAKE 1 TABLET(75 MG) BY MOUTH DAILY 06/11/24   Theophilus Andrews, Tully GRADE, MD  Magnesium 400 MG TABS Take 400 mg by mouth 3 (three) times daily.    [provider]  methimazole  (TAPAZOLE ) 5 MG tablet Take 0.5 tablets (2.5 mg total) by mouth as directed. Half a tablet Mondays and Wednesdays  only ,none the rest of the week 05/31/24   Shamleffer, Ibtehal Jaralla, MD  pantoprazole  (PROTONIX ) 40 MG tablet TAKE 1 TABLET(40 MG) BY MOUTH TWICE DAILY 05/01/24   Legrand Victory LITTIE DOUGLAS, MD  PREVIDENT 5000 DRY MOUTH 1.1 % GEL dental gel Place 1 Application onto teeth 2 (two) times daily. 03/11/24   [provider]  RESTASIS 0.05 % ophthalmic emulsion 1 drop 2 (two) times daily. 07/20/20   [provider]  zaleplon  (SONATA ) 5  MG capsule TAKE 1-2 CAPSULES BY MOUTH DAILY AS NEEDED FOR SLEEP 09/11/24   Neysa Rama D, MD    Allergies: Cefuroxime , Doxycycline , Erythromycin, Macrobid [nitrofurantoin], Penicillins, and Sulfa antibiotics    Review of Systems  Musculoskeletal:  Positive for joint swelling.  Skin:  Positive for rash.    Updated Vital Signs BP (!) 157/91 (BP Location: Right Arm)   Pulse 66   Temp 97.8 F (36.6 C)   Resp 18   SpO2 100%   Physical Exam Vitals and nursing note reviewed.  Constitutional:      Appearance: Normal  appearance.  HENT:     Head: Normocephalic and atraumatic.     Mouth/Throat:     Mouth: Mucous membranes are moist.  Eyes:     General: No scleral icterus.       Right eye: No discharge.        Left eye: No discharge.     Conjunctiva/sclera: Conjunctivae normal.  Cardiovascular:     Rate and Rhythm: Normal rate and regular rhythm.     Pulses: Normal pulses.  Pulmonary:     Effort: Pulmonary effort is normal.     Breath sounds: Normal breath sounds.  Abdominal:     General: There is no distension.     Tenderness: There is no abdominal tenderness.  Musculoskeletal:        General: No deformity.     Cervical back: Normal range of motion.  Skin:    General: Skin is warm and dry.     Capillary Refill: Capillary refill takes less than 2 seconds.     Findings: Rash present.     Comments: Obvious edema noted to bilateral wrists and fingers.  There is some erythema noted.  There is also a widespread macular erythematous rash on the patient's upper torso and back.  It is described as urticaric.  There are no raised lesions or wheals.  Neurological:     Mental Status: She is alert.     Motor: No weakness.  Psychiatric:        Mood and Affect: Mood normal.     (all labs ordered are listed, but only abnormal results are displayed) Labs Reviewed - No data to display  EKG: None  Radiology: No results found.  Procedures   Medications Ordered in the ED  diphenhydrAMINE  (BENADRYL ) injection 25 mg (25 mg Intravenous Given 09/18/24 1524)  methylPREDNISolone  sodium succinate (SOLU-MEDROL ) 125 mg/2 mL injection 125 mg (125 mg Intravenous Given 09/18/24 1524)  famotidine  (PEPCID ) IVPB 20 mg premix (20 mg Intravenous New Bag/Given 09/18/24 1530)                                 Medical Decision Making Risk Prescription drug management.   This patient presents to the ED for concern of allergic reaction, this involves an extensive number of treatment options, and is a complaint that  carries with it a high risk of complications and morbidity.   Differential diagnosis includes: Anaphylaxis, urticaria, rash, hives, edema  Co morbidities:  hyperthyroidism, hypertension, GERD, hyperlipidemia   Additional history: Patient was evaluated by her PCP earlier today for diagnosis of acute bacterial sinusitis.  She was given cefuroxime .  Per patient's PCP, patient had been given this medication at once in the past without any acute reactions.  Lab Tests:  Not indicated  Imaging Studies:  Not indicated  Cardiac Monitoring/ECG:  The patient was maintained on  a cardiac monitor.  I personally viewed and interpreted the cardiac monitored which showed an underlying rhythm of: Normal sinus rhythm  Medicines ordered and prescription drug management:  I ordered medication including  Medications  diphenhydrAMINE  (BENADRYL ) injection 25 mg (25 mg Intravenous Given 09/18/24 1524)  methylPREDNISolone  sodium succinate (SOLU-MEDROL ) 125 mg/2 mL injection 125 mg (125 mg Intravenous Given 09/18/24 1524)  famotidine  (PEPCID ) IVPB 20 mg premix (20 mg Intravenous New Bag/Given 09/18/24 1530)   for allergic reaction Reevaluation of the patient after these medicines showed that the patient improved I have reviewed the patients home medicines and have made adjustments as needed  Test Considered:   none  Critical Interventions:   none  Consultations Obtained: None  Problem List / ED Course:     ICD-10-CM   1. Urticaria due to drug allergy  L50.0    T50.905A       MDM: 73 year old female who presents emergency department for evaluation of distal extremity edema and rash.  Patient was recently diagnosed with bacterial sinusitis and treated with cefuroxime  by her PCP.  Patient took this medication this morning and approximately 15 minutes later developed swelling in her bilateral hands and fingers.  Physical exam revealed edema and tenderness to her hands.  There was also evidence  of macular erythematous rash to the patient's upper torso and back.  The rash is urticaria's.  There was no papules and wheals noted.  Patient does not have any airway compromise.  There is no tongue swelling noted.  Patient has been given IV Benadryl , Solu-Medrol  and Pepcid  for treatment of acute allergic reaction.  Epinephrine is not indicated due to lack of airway compromise.  Upon reassessment, approximately 1 to 2 hours after patient received this medication, she reports she was feeling better.  Repeat physical exam showed reduced finger swelling and resolution of the rash.  Patient states she has spoken with her PCP who is planning to adjust her antibiotics for her sinusitis.  I am comfortable with this.  Patient has been educated on the importance of staying away from this medication in the future.  She verbalized understanding to this.  I recommended the patient follow-up with her PCP if needed for additional bacterial sinusitis treatment.  Patient's vital signs are stable.  Patient is appropriate for discharge at this time.   Dispostion:  After consideration of the diagnostic results and the patients response to treatment, I feel that the patient would benefit from supportive care.   Final diagnoses:  Urticaria due to drug allergy    ED Discharge Orders     None          Torrence Marry RAMAN, PA-C 09/18/24 1653    Pamella Ozell LABOR, DO 09/26/24 [phone]

## 2024-09-18 NOTE — Progress Notes (Signed)
   Subjective:    Patient ID: Bethany Cole, female    DOB: 1951/05/01, 73 y.o.   MRN: [id number]  HPI Here for 2 weeks of sinus pressure, ear pressure, PND, and blowing green mucus from the nose. No cough or fever.    Review of Systems  Constitutional: Negative.   HENT:  Positive for congestion, ear pain, postnasal drip and sinus pressure. Negative for sore throat.   Eyes: Negative.   Respiratory: Negative.         Objective:   Physical Exam Constitutional:      Appearance: Normal appearance.  HENT:     Right Ear: Tympanic membrane, ear canal and external ear normal.     Left Ear: Tympanic membrane, ear canal and external ear normal.     Nose: Nose normal.     Mouth/Throat:     Pharynx: Oropharynx is clear.  Eyes:     Conjunctiva/sclera: Conjunctivae normal.  Pulmonary:     Effort: Pulmonary effort is normal.     Breath sounds: Normal breath sounds.  Lymphadenopathy:     Cervical: No cervical adenopathy.  Neurological:     Mental Status: She is alert.           Assessment & Plan:  Sinusitis, treat with 10 days of Cefuroxime . Add Mucinex as needed. Garnette Olmsted, MD

## 2024-09-18 NOTE — ED Triage Notes (Signed)
 Reports rash and hadn swelling with generalized itching since around 1130 after taking new medication. Unsure of name of medication. Denies SHOB, no airway involvement.

## 2024-09-18 NOTE — ED Notes (Signed)
 Patient did not want to get completely undressed.

## 2024-09-18 NOTE — Addendum Note (Signed)
 Addended by: LADONNA INOCENTE SAILOR on: 09/18/2024 11:26 AM   Modules accepted: Orders

## 2024-09-19 ENCOUNTER — Other Ambulatory Visit: Payer: Self-pay | Admitting: Internal Medicine

## 2024-09-19 ENCOUNTER — Ambulatory Visit: Payer: Self-pay | Admitting: Internal Medicine

## 2024-09-19 DIAGNOSIS — I10 Essential (primary) hypertension: Secondary | ICD-10-CM

## 2024-09-19 MED ORDER — LEVOFLOXACIN 500 MG PO TABS: 500.0000 mg | ORAL_TABLET | Freq: Every day | ORAL | 0 refills | Status: AC

## 2024-09-19 NOTE — Addendum Note (Signed)
 Addended by: JOHNNY SENIOR A on: 09/19/2024 12:53 PM   Modules accepted: Orders

## 2024-09-19 NOTE — Telephone Encounter (Signed)
 FYI Only or Action Required?: Action required by provider: Abx change.  Patient was last seen in primary care on 09/18/2024 by Johnny Garnette LABOR, MD.  Called Nurse Triage reporting Advice Only.  Symptoms began several days ago.  Interventions attempted: Prescription medications: cefUROXime  (CEFTIN ) 500 MG tablet.  Symptoms are: unchanged.  Triage Disposition: Call PCP When Office is Open  Patient/caregiver understands and will follow disposition?: Yes Reason for Disposition  [1] Caller requesting NON-URGENT health information AND [2] PCP's office is the best resource  Answer Assessment - Initial Assessment Questions Allergic reaction sx resolved. Please sent new abx to [id number] - [place], [place] - [address] AT [facility] [address] & [facility]  [address], [place] [address]    1. REASON FOR CALL: What is the main reason for your call? or How can I best help you?     Requesting new abx for sinus infection due to allergic reaction from cefUROXime  (CEFTIN ) 500 MG tablet  2. SYMPTOMS : Do you have any symptoms?      Sinus pain and pressure, nothing new or worsening from visit yesterday.  Protocols used: Information Only Call - No Triage-A-AH  Copied from CRM [id number]. Topic: Clinical - Prescription Issue >> Sep 19, 2024  8:27 AM Macario HERO wrote: Reason for CRM: Patient stated she received a medication for her sinus infection which caused an allergic reaction and she had to go to the emergency room and now she is requesting a new medication. -Requesting a call back.

## 2024-09-19 NOTE — Telephone Encounter (Signed)
 I sent in a RX for Levaquin 

## 2024-09-23 ENCOUNTER — Other Ambulatory Visit: Payer: Self-pay | Admitting: Family Medicine

## 2024-09-23 DIAGNOSIS — G8929 Other chronic pain: Secondary | ICD-10-CM

## 2024-09-24 NOTE — Telephone Encounter (Signed)
 Last OV 01/09/24 Next OV 11/05/24  Last refill 06/27/24 Qty #180/0   Renal labs 04/03/24  Component Ref Range & Units (hover) 5 mo ago (04/03/24) 12 mo ago (09/26/23) 1 yr ago (03/09/23) 2 yr ago (02/18/22) 3 yr ago (08/28/21) 3 yr ago (11/17/20) 3 yr ago (11/07/20)  Sodium 135 131 Low  132 Low  132 Low  135 139 R 137  Potassium 3.8 4.7 4.1 4.4 4.2 5.2 R 4.3  Chloride 100 97 97 97 100 101 R 101  CO2 29 28 28 28 30 19  Low  R 29  Glucose, Bld 79 88 90 90 104 High  84 R 90  BUN 20 13 14 16 13 12  R 15  Creatinine, Ser 0.58 0.68 0.72 0.67 0.76 0.72 R 0.73  GFR 89.18 86.14 CM 83.02 CM 87.43 CM 78.64 CM  83.00 CM  Comment: Calculated using the CKD-EPI Creatinine Equation (2021)  Calcium  9.3 9.5 9.3 9.6 9.5 9.6 R 9.5

## 2024-10-03 ENCOUNTER — Ambulatory Visit (INDEPENDENT_AMBULATORY_CARE_PROVIDER_SITE_OTHER): Admitting: *Deleted

## 2024-10-03 DIAGNOSIS — E538 Deficiency of other specified B group vitamins: Secondary | ICD-10-CM | POA: Diagnosis not present

## 2024-10-03 MED ORDER — CYANOCOBALAMIN 1000 MCG/ML IJ SOLN
1000.0000 ug | Freq: Once | INTRAMUSCULAR | Status: AC
  Administered 2024-10-03: 1000 ug via INTRAMUSCULAR

## 2024-10-03 NOTE — Progress Notes (Signed)
 Per orders of Dr. Ardyth Harps, injection of B12 given by Kern Reap. Patient tolerated injection well.

## 2024-10-08 ENCOUNTER — Encounter: Payer: Self-pay | Admitting: Internal Medicine

## 2024-10-08 ENCOUNTER — Ambulatory Visit: Admitting: Internal Medicine

## 2024-10-08 VITALS — BP 130/86 | HR 70 | Temp 97.4°F | Wt 172.1 lb

## 2024-10-08 DIAGNOSIS — I10 Essential (primary) hypertension: Secondary | ICD-10-CM

## 2024-10-08 DIAGNOSIS — F5101 Primary insomnia: Secondary | ICD-10-CM

## 2024-10-08 DIAGNOSIS — M25511 Pain in right shoulder: Secondary | ICD-10-CM

## 2024-10-08 DIAGNOSIS — E782 Mixed hyperlipidemia: Secondary | ICD-10-CM

## 2024-10-08 LAB — LIPID PANEL
Cholesterol: 174 mg/dL (ref 0–200)
HDL: 86.1 mg/dL (ref >39.00)
LDL Cholesterol: 77 mg/dL (ref 0–99)
NonHDL: 87.58
Total CHOL/HDL Ratio: 2
Triglycerides: 54 mg/dL (ref 0.0–149.0)
VLDL: 10.8 mg/dL (ref 0.0–40.0)

## 2024-10-08 MED ORDER — GABAPENTIN 100 MG PO CAPS: 100.0000 mg | ORAL_CAPSULE | Freq: Three times a day (TID) | ORAL | 1 refills | Status: AC

## 2024-10-08 MED ORDER — MELOXICAM 7.5 MG PO TABS: 7.5000 mg | ORAL_TABLET | Freq: Every day | ORAL | 0 refills | Status: DC

## 2024-10-08 NOTE — Telephone Encounter (Signed)
 VOB initiated for 2026.

## 2024-10-08 NOTE — Progress Notes (Signed)
 Established Patient Office Visit     CC/Reason for Visit: Discuss chronic and acute concerns  HPI: Bethany Cole is a 73 y.o. female who is coming in today for the above mentioned reasons. Past Medical History is significant for: Hypertension, hyperlipidemia, osteoporosis, GERD, OSA, insomnia, vitamin B12 deficiency.  She recently had a sinus infection and had an allergic reaction to cefuroxime .  She was later prescribed Levaquin  and has since had some upper arm and shoulder pain.  She is requesting her lipid panel checked.  She is also requesting gabapentin  be refilled for sleep.  She has an appointment with a new sleep medicine physician in March.   Past Medical/Surgical History: Past Medical History:  Diagnosis Date   Allergy    Anemia    Anxiety    Arthritis    Cataracts, bilateral    Gastritis    GERD (gastroesophageal reflux disease)    Heart murmur    Hypertension    Hyperthyroidism    OSA on CPAP    Sleep apnea    Squamous cell carcinoma of skin     Past Surgical History:  Procedure Laterality Date   ABDOMINAL HYSTERECTOMY     ENDOMETRIAL ABLATION     EYE SURGERY     HERNIA REPAIR     as an infant   UPPER GASTROINTESTINAL ENDOSCOPY  11/09/2021   Has had sevearal in [place]   WRIST ARTHROSCOPY Right     Social History:  reports that she has never smoked. She has never used smokeless tobacco. She reports current alcohol use of about 1.0 standard drink of alcohol per week. She reports that she does not use drugs.  Allergies: Allergies[1]  Family History:  Family History  Problem Relation Age of Onset   Ovarian cancer Mother    Stomach cancer Mother        mets from ovarian cancer   Arthritis Mother    Cancer Mother    Hearing loss Mother    Hypertension Mother    Heart disease Father    Cancer - Other Father        laryngeal cancer   Alcohol abuse Father    Arthritis Father    Hyperlipidemia Father    Hypertension Father    Hyperlipidemia  Brother    Colon cancer Neg Hx    Esophageal cancer Neg Hx    Pancreatic cancer Neg Hx    Liver disease Neg Hx    Colon polyps Neg Hx    Rectal cancer Neg Hx     Current Medications[2]  Review of Systems:  Negative unless indicated in HPI.   Physical Exam: Vitals:   10/08/24 0925  BP: 130/86  Pulse: 70  Temp: (!) 97.4 F (36.3 C)  TempSrc: Oral  SpO2: 98%  Weight: 172 lb 1.6 oz (78.1 kg)    Body mass index is 30.01 kg/m.   Physical Exam Vitals reviewed.  Constitutional:      Appearance: Normal appearance.  HENT:     Head: Normocephalic and atraumatic.  Eyes:     Conjunctiva/sclera: Conjunctivae normal.  Cardiovascular:     Rate and Rhythm: Normal rate and regular rhythm.  Pulmonary:     Effort: Pulmonary effort is normal.     Breath sounds: Normal breath sounds.  Skin:    General: Skin is warm and dry.  Neurological:     General: No focal deficit present.     Mental Status: She is alert and oriented to person,  place, and time.  Psychiatric:        Mood and Affect: Mood normal.        Behavior: Behavior normal.        Thought Content: Thought content normal.        Judgment: Judgment normal.      Impression and Plan:  Primary hypertension  Mixed hyperlipidemia -     Lipid panel; Future  Primary insomnia -     Gabapentin ; Take 1 capsule (100 mg total) by mouth 3 (three) times daily.  Dispense: 90 capsule; Refill: 1  Acute pain of both shoulders -     Meloxicam ; Take 1 tablet (7.5 mg total) by mouth daily.  Dispense: 30 tablet; Refill: 0   - Blood pressure is fairly well-controlled on current. - Check lipid panel today. - Refill gabapentin  until she can get in with her sleep physician. - Unclear if her arm and shoulder pain is related to Levaquin  induced tendinitis but we will go ahead and treat with meloxicam .  Time spent:32 minutes reviewing chart, interviewing and examining patient and formulating plan of care.     Tully Theophilus Andrews, MD [facility]     [1]  Allergies Allergen Reactions   Cefuroxime  Itching, Swelling and Rash   Doxycycline  Nausea And Vomiting   Erythromycin    Macrobid [Nitrofurantoin]    Penicillins    Sulfa Antibiotics   [2]  Current Outpatient Medications:    acetaminophen (TYLENOL) 650 MG CR tablet, Take 650 mg by mouth at bedtime as needed for pain., Disp: , Rfl:    atorvastatin  (LIPITOR) 20 MG tablet, TAKE 1 TABLET(20 MG) BY MOUTH DAILY, Disp: 90 tablet, Rfl: 0   celecoxib  (CELEBREX ) 100 MG capsule, TAKE 1 CAPSULE(100 MG) BY MOUTH TWICE DAILY AS NEEDED FOR MODERATE PAIN, Disp: 180 capsule, Rfl: 0   Cyanocobalamin  (VITAMIN B-12 IJ), Inject as directed every 30 (thirty) days., Disp: , Rfl:    denosumab  (PROLIA ) 60 MG/ML SOSY injection, Inject 60 mg into the skin every 6 (six) months. Took in December 2022., Disp: , Rfl:    diclofenac Sodium (VOLTAREN) 1 % GEL, Apply topically 4 (four) times daily., Disp: , Rfl:    dicyclomine  (BENTYL ) 10 MG capsule, Take 1 capsule (10 mg total) by mouth every 8 (eight) hours as needed (abdominal cramping/diarrhea)., Disp: 45 capsule, Rfl: 1   fluticasone  (FLONASE ) 50 MCG/ACT nasal spray, Place 1 spray into both nostrils 2 (two) times daily., Disp: 16 g, Rfl: 0   gabapentin  (NEURONTIN ) 100 MG capsule, Take 1 capsule (100 mg total) by mouth 3 (three) times daily., Disp: 90 capsule, Rfl: 1   irbesartan  (AVAPRO ) 75 MG tablet, TAKE 1 TABLET(75 MG) BY MOUTH DAILY, Disp: 90 tablet, Rfl: 0   Magnesium 400 MG TABS, Take 400 mg by mouth 3 (three) times daily., Disp: , Rfl:    meloxicam  (MOBIC ) 7.5 MG tablet, Take 1 tablet (7.5 mg total) by mouth daily., Disp: 30 tablet, Rfl: 0   methimazole  (TAPAZOLE ) 5 MG tablet, Take 0.5 tablets (2.5 mg total) by mouth as directed. Half a tablet Mondays and Wednesdays  only ,none the rest of the week, Disp: 13 tablet, Rfl: 2   pantoprazole  (PROTONIX ) 40 MG tablet, TAKE 1 TABLET(40 MG) BY MOUTH TWICE DAILY,  Disp: 180 tablet, Rfl: 1   PREVIDENT 5000 DRY MOUTH 1.1 % GEL dental gel, Place 1 Application onto teeth 2 (two) times daily., Disp: , Rfl:    RESTASIS 0.05 % ophthalmic  emulsion, 1 drop 2 (two) times daily., Disp: , Rfl:    zaleplon  (SONATA ) 5 MG capsule, TAKE 1-2 CAPSULES BY MOUTH DAILY AS NEEDED FOR SLEEP, Disp: 60 capsule, Rfl: 5

## 2024-10-09 ENCOUNTER — Ambulatory Visit: Payer: Self-pay | Admitting: Internal Medicine

## 2024-10-24 NOTE — Telephone Encounter (Signed)
 Will renew prior auth after 10/25/24.

## 2024-10-26 NOTE — Telephone Encounter (Signed)
 Patient now has [employer]. I have added this to her demographics.

## 2024-10-26 NOTE — Telephone Encounter (Signed)
 Unable to verify/ locate pt using [facility] provider portal. Can you reach out to pt to see if there has been a change with insurance, and update if so. She is scheduled for Prolia  injection 11/05/24 but I need to re-verify benefits and prior authorization requirements.

## 2024-10-26 NOTE — Telephone Encounter (Signed)
 Needs prior auth renewal.

## 2024-11-01 NOTE — Telephone Encounter (Signed)
 Insurance updated to [employer], VOB re-submitted.

## 2024-11-02 NOTE — Telephone Encounter (Signed)
 Prolia  non-formulary with [employer]TA, Prior Authorization required. PA form printed.   Prior Authorization initiated for PROLIA  via FAX form.   Will also run benefits for [employer]. VOB submitted.

## 2024-11-03 ENCOUNTER — Encounter: Payer: Self-pay | Admitting: Gastroenterology

## 2024-11-05 ENCOUNTER — Ambulatory Visit

## 2024-11-05 ENCOUNTER — Other Ambulatory Visit (HOSPITAL_COMMUNITY): Payer: Self-pay

## 2024-11-05 ENCOUNTER — Other Ambulatory Visit: Payer: Self-pay | Admitting: Internal Medicine

## 2024-11-05 ENCOUNTER — Telehealth: Payer: Self-pay

## 2024-11-05 DIAGNOSIS — E538 Deficiency of other specified B group vitamins: Secondary | ICD-10-CM

## 2024-11-05 DIAGNOSIS — M810 Age-related osteoporosis without current pathological fracture: Secondary | ICD-10-CM

## 2024-11-05 DIAGNOSIS — M25511 Pain in right shoulder: Secondary | ICD-10-CM

## 2024-11-05 MED ORDER — DENOSUMAB 60 MG/ML ~~LOC~~ SOSY
60.0000 mg | PREFILLED_SYRINGE | Freq: Once | SUBCUTANEOUS | Status: AC
  Administered 2024-11-05: 60 mg via SUBCUTANEOUS

## 2024-11-05 MED ORDER — CYANOCOBALAMIN 1000 MCG/ML IJ SOLN
1000.0000 ug | Freq: Once | INTRAMUSCULAR | Status: AC
  Administered 2024-11-05: 1000 ug via INTRAMUSCULAR

## 2024-11-05 NOTE — Telephone Encounter (Signed)
*  Pulm  Pharmacy Patient Advocate Encounter   Received notification from Fax that prior authorization for Zaleplon  5mg  cap is required/requested.   Insurance verification completed.   The patient is insured through [facility] ADVANTAGE/RX ADVANCE.   Per test claim: PA required; PA started via CoverMyMeds. KEY [id number] . Waiting for clinical questions to populate.

## 2024-11-05 NOTE — Telephone Encounter (Signed)
 [employer]  Prior Authorization for PROLIA  APPROVED PA# [id number] Valid: 11/05/24-02/03/25 Units: 60  Approval letter will be scanned to pt chart.

## 2024-11-05 NOTE — Telephone Encounter (Signed)
 Last Prolia  inj 11/05/2024 Next Prolia  inj due 05/06/2025

## 2024-11-05 NOTE — Progress Notes (Signed)
 Patient received PROLIA  injection today, SQ, right upper arm, tolerated well.   [employer]  MFG: [employer] LOT#: [id number] EXP: [id number] NDC: 44486-289-78  Medical Buy and Bill   Prior Authorization for PROLIA  APPROVED PA# [id number] Valid: 11/05/24-02/03/25 Units: 60

## 2024-11-05 NOTE — Progress Notes (Signed)
 Patient is in office today for a nurse visit for B12 Injection. Patient Injection was given in the  Left deltoid. Patient tolerated injection well.

## 2024-11-15 NOTE — Telephone Encounter (Signed)
 [employer] Prior Authorization Department does not manage Prior Authorizations for this plan. Please contact member services phone number on the back of the member ID card. - look into alternative submission

## 2024-11-27 ENCOUNTER — Other Ambulatory Visit (HOSPITAL_COMMUNITY): Payer: Self-pay

## 2024-11-27 NOTE — Telephone Encounter (Signed)
 Insurance covers 1 capsule per day, 10mg  capsules 30/30days showing as $3.38. 30/30days for 5mg  showing $2.35   *pricing through [facility]   This is in regards for Zaleplon  med.   New rx is needed and insurance covers the above details through [employer].  I have updated the preferred pharmacy. Can you please send new script as directed above that will be covered by her insurance.

## 2024-11-29 ENCOUNTER — Other Ambulatory Visit (HOSPITAL_BASED_OUTPATIENT_CLINIC_OR_DEPARTMENT_OTHER): Payer: Self-pay

## 2024-11-29 MED ORDER — ZALEPLON 5 MG PO CAPS
5.0000 mg | ORAL_CAPSULE | Freq: Every evening | ORAL | 0 refills | Status: AC | PRN
  Filled 2024-11-29: qty 30, 30d supply, fill #0

## 2024-11-29 NOTE — Telephone Encounter (Signed)
 Atc x1. LMTCB  Admin, please try to schedule pt sooner then March.  30 min slot 2/11 at 10:30

## 2024-11-29 NOTE — Telephone Encounter (Signed)
 One time script of zaleplon  given. I have an appointment with her in March 2026. I will try to reschedule and get her seen sooner to continue to provide zaleplon .

## 2024-11-30 ENCOUNTER — Encounter: Payer: Self-pay | Admitting: Internal Medicine

## 2024-11-30 ENCOUNTER — Other Ambulatory Visit

## 2024-11-30 ENCOUNTER — Ambulatory Visit: Admitting: Internal Medicine

## 2024-11-30 VITALS — BP 130/78 | Ht 63.5 in | Wt 176.0 lb

## 2024-11-30 DIAGNOSIS — E0590 Thyrotoxicosis, unspecified without thyrotoxic crisis or storm: Secondary | ICD-10-CM

## 2024-11-30 DIAGNOSIS — E0500 Thyrotoxicosis with diffuse goiter without thyrotoxic crisis or storm: Secondary | ICD-10-CM

## 2024-11-30 NOTE — Telephone Encounter (Signed)
 Ov 2/11. NFN

## 2024-11-30 NOTE — Progress Notes (Unsigned)
 "  Name: Bethany Cole  MRN/ DOB: [id number], 09/15/1951    Age/ Sex: 74 y.o., female     PCP: Theophilus Andrews, Tully GRADE, MD   Reason for Endocrinology Evaluation: Hyperthyroidism     Initial Endocrinology Clinic Visit: 12/03/2020    PATIENT IDENTIFIER: Bethany Cole is a 74 y.o., female with a past medical history of HTN, GERD and Hyperthyroidism. She has followed with [facility] since 12/03/2020 for consultative assistance with management of her Hyperthyroidism.    Moved from [place]  04/2020  HISTORICAL SUMMARY:  She has been diagnosed with hyperthyroidism in 11/2019 during routine work up with a TSH of 0.15 uIU/L , FT4 1.5 ng/dL ( 9.2-8.4 ng/dL) and an elevated T3 5.69 pg/mL ( 1.7-3.7) . She had worsening anxiety and tremors at the time , this was attributed to Grave's Disease  On her initial visit at our clinic she was on methimazole  2.5 mg daily    No FH of thyroid  disease    SUBJECTIVE:     Today (11/30/2024):  Bethany Cole is here for hyperthyroidism.   Patient has been noted weight gain since her last visit here She has headaches that she attributes to allergies  No palpitations  Has eye pain ,  No constipation or diarrhea  No local neck swelling    Methimazole  5 mg , HALF a tablet Monday and Wednesday      HISTORY:  Past Medical History:  Past Medical History:  Diagnosis Date   Allergy    Anemia    Anxiety    Arthritis    Cataracts, bilateral    Gastritis    GERD (gastroesophageal reflux disease)    Heart murmur    Hypertension    Hyperthyroidism    OSA on CPAP    Sleep apnea    Squamous cell carcinoma of skin    Past Surgical History:  Past Surgical History:  Procedure Laterality Date   ABDOMINAL HYSTERECTOMY     ENDOMETRIAL ABLATION     EYE SURGERY     HERNIA REPAIR     as an infant   UPPER GASTROINTESTINAL ENDOSCOPY  11/09/2021   Has had sevearal in [place]   WRIST ARTHROSCOPY Right    Social History:  reports that  she has never smoked. She has never used smokeless tobacco. She reports current alcohol use of about 1.0 standard drink of alcohol per week. She reports that she does not use drugs. Family History:  Family History  Problem Relation Age of Onset   Ovarian cancer Mother    Stomach cancer Mother        mets from ovarian cancer   Arthritis Mother    Cancer Mother    Hearing loss Mother    Hypertension Mother    Heart disease Father    Cancer - Other Father        laryngeal cancer   Alcohol abuse Father    Arthritis Father    Hyperlipidemia Father    Hypertension Father    Hyperlipidemia Brother    Colon cancer Neg Hx    Esophageal cancer Neg Hx    Pancreatic cancer Neg Hx    Liver disease Neg Hx    Colon polyps Neg Hx    Rectal cancer Neg Hx      HOME MEDICATIONS: Allergies as of 11/30/2024       Reactions   Cefuroxime  Itching, Swelling, Rash   Doxycycline  Nausea And Vomiting   Erythromycin    Macrobid [nitrofurantoin]  Penicillins    Sulfa Antibiotics         Medication List        Accurate as of November 30, 2024  1:04 PM. If you have any questions, ask your nurse or doctor.          acetaminophen 650 MG CR tablet Commonly known as: TYLENOL Take 650 mg by mouth at bedtime as needed for pain.   atorvastatin  20 MG tablet Commonly known as: LIPITOR TAKE 1 TABLET(20 MG) BY MOUTH DAILY   celecoxib  100 MG capsule Commonly known as: CELEBREX  TAKE 1 CAPSULE(100 MG) BY MOUTH TWICE DAILY AS NEEDED FOR MODERATE PAIN   denosumab  60 MG/ML Sosy injection Commonly known as: PROLIA  Inject 60 mg into the skin every 6 (six) months. Took in December 2022.   diclofenac Sodium 1 % Gel Commonly known as: VOLTAREN Apply topically 4 (four) times daily.   dicyclomine  10 MG capsule Commonly known as: BENTYL  Take 1 capsule (10 mg total) by mouth every 8 (eight) hours as needed (abdominal cramping/diarrhea).   fluticasone  50 MCG/ACT nasal spray Commonly known as:  Flonase  Place 1 spray into both nostrils 2 (two) times daily.   gabapentin  100 MG capsule Commonly known as: NEURONTIN  Take 1 capsule (100 mg total) by mouth 3 (three) times daily.   irbesartan  75 MG tablet Commonly known as: AVAPRO  TAKE 1 TABLET(75 MG) BY MOUTH DAILY   Magnesium 400 MG Tabs Take 400 mg by mouth 3 (three) times daily.   meloxicam  7.5 MG tablet Commonly known as: MOBIC  TAKE 1 TABLET(7.5 MG) BY MOUTH DAILY   methimazole  5 MG tablet Commonly known as: TAPAZOLE  Take 0.5 tablets (2.5 mg total) by mouth as directed. Half a tablet Mondays and Wednesdays  only ,none the rest of the week   pantoprazole  40 MG tablet Commonly known as: PROTONIX  TAKE 1 TABLET(40 MG) BY MOUTH TWICE DAILY   PreviDent 5000 Dry Mouth 1.1 % Gel dental gel Generic drug: sodium fluoride Place 1 Application onto teeth 2 (two) times daily.   Restasis 0.05 % ophthalmic emulsion Generic drug: cycloSPORINE 1 drop 2 (two) times daily.   VITAMIN B-12 IJ Inject as directed every 30 (thirty) days.   zaleplon  5 MG capsule Commonly known as: SONATA  Take 1 capsule (5 mg total) by mouth at bedtime as needed for sleep.          OBJECTIVE:   PHYSICAL EXAM: VS: BP (!) 160/98   Ht 5' 3.5 (1.613 m)   Wt 176 lb (79.8 kg)   BMI 30.69 kg/m    EXAM: General: Pt appears well and is in NAD  Neck: General: Supple without adenopathy. Thyroid : Thyroid  size normal.  No goiter or nodules appreciated.   Lungs: Clear with good BS bilat   Heart: Auscultation: RRR.  Abdomen:  soft, nontender  Extremities:  BL LE: No pretibial edema   Mental Status: Judgment, insight: Intact Mood and affect: No depression, anxiety, or agitation     DATA REVIEWED:  Latest Reference Range & Units 05/30/24 14:37  TSH 0.40 - 4.50 mIU/L 2.08  Triiodothyronine,Free,Serum 2.3 - 4.2 pg/mL 3.1  T4,Free(Direct) 0.8 - 1.8 ng/dL 1.3     Results for ELEXIS, POLLAK (MRN [id number]) as of 04/09/2021 07:59  Ref. Range  12/03/2020 09:39  TRAB Latest Ref Range: <=2.00 IU/L 1.02    ASSESSMENT / PLAN / RECOMMENDATIONS:   Hyperthyroidism :    - This has been attributed to Graves' disease through a prior diagnosis. TRAB here was detectable  but not elevated  at 1.02 IU/L but she already has been on methimazole  at the time  -Patient is clinically euthyroid -No local neck symptoms -TFTs **** - Repeat labs in 3 months  Medications  Methimazole  5 mg , HALF a tablet Monday and  Wednesday only (none the rest of the week)   2. Graves' Disease:   - No extra thyroidal manifestations of Graves' Disease   F/U in 6 months      Signed electronically by: Stefano Redgie Butts, MD  [facility]  [facility] [address] [place] [place], [place] [address] Phone: [phone] FAX: [phone]      CC: Theophilus Andrews, Tully GRADE, MD [address] [place] [address] Phone: [phone]  Fax: [phone]   Return to Endocrinology clinic as below: Future Appointments  Date Time Provider Department Center  12/05/2024 10:00 AM [facility] [address]  12/05/2024 10:30 AM Theodoro Lakes, MD [facility] [address]  05/06/2025 11:30 AM [facility] None    "

## 2024-12-05 ENCOUNTER — Encounter

## 2024-12-05 ENCOUNTER — Ambulatory Visit

## 2025-01-09 ENCOUNTER — Ambulatory Visit

## 2025-02-27 ENCOUNTER — Other Ambulatory Visit

## 2025-05-06 ENCOUNTER — Ambulatory Visit

## 2025-05-29 ENCOUNTER — Ambulatory Visit: Admitting: Internal Medicine
